# Patient Record
Sex: Female | Born: 1960 | Race: Black or African American | Hispanic: No | Marital: Single | Smoking: Former smoker
Health system: Southern US, Community
[De-identification: ages and names within clinical notes are randomized; demographics above are authoritative.]

## PROBLEM LIST (undated history)

## (undated) DIAGNOSIS — R920 Mammographic microcalcification found on diagnostic imaging of breast: Secondary | ICD-10-CM

## (undated) DIAGNOSIS — Z720 Tobacco use: Secondary | ICD-10-CM

## (undated) DIAGNOSIS — D696 Thrombocytopenia, unspecified: Secondary | ICD-10-CM

## (undated) DIAGNOSIS — E119 Type 2 diabetes mellitus without complications: Secondary | ICD-10-CM

## (undated) DIAGNOSIS — I10 Essential (primary) hypertension: Secondary | ICD-10-CM

## (undated) DIAGNOSIS — K8020 Calculus of gallbladder without cholecystitis without obstruction: Secondary | ICD-10-CM

## (undated) HISTORY — DX: Essential (primary) hypertension: I10

## (undated) HISTORY — DX: Calculus of gallbladder without cholecystitis without obstruction: K80.20

## (undated) HISTORY — DX: Tobacco use: Z72.0

## (undated) HISTORY — DX: Thrombocytopenia, unspecified: D69.6

## (undated) HISTORY — DX: Mammographic microcalcification found on diagnostic imaging of breast: R92.0

---

## 2004-09-18 ENCOUNTER — Ambulatory Visit: Payer: Self-pay | Admitting: Internal Medicine

## 2005-11-10 ENCOUNTER — Ambulatory Visit: Payer: Self-pay | Admitting: Internal Medicine

## 2005-11-24 ENCOUNTER — Ambulatory Visit: Payer: Self-pay | Admitting: Internal Medicine

## 2006-11-12 ENCOUNTER — Ambulatory Visit: Payer: Self-pay | Admitting: Internal Medicine

## 2007-12-07 ENCOUNTER — Ambulatory Visit: Payer: Self-pay | Admitting: Internal Medicine

## 2008-01-12 ENCOUNTER — Ambulatory Visit: Payer: Self-pay | Admitting: Internal Medicine

## 2008-09-25 ENCOUNTER — Emergency Department: Payer: Self-pay | Admitting: Unknown Physician Specialty

## 2009-01-15 ENCOUNTER — Ambulatory Visit: Payer: Self-pay | Admitting: Internal Medicine

## 2009-01-16 ENCOUNTER — Ambulatory Visit: Payer: Self-pay | Admitting: Internal Medicine

## 2009-05-09 ENCOUNTER — Ambulatory Visit: Payer: Self-pay | Admitting: Unknown Physician Specialty

## 2009-05-09 LAB — HM COLONOSCOPY

## 2010-01-14 HISTORY — PX: KNEE SURGERY: SHX244

## 2010-01-21 ENCOUNTER — Ambulatory Visit: Payer: Self-pay | Admitting: Orthopedic Surgery

## 2010-01-29 ENCOUNTER — Ambulatory Visit: Payer: Self-pay | Admitting: Orthopedic Surgery

## 2010-02-03 ENCOUNTER — Ambulatory Visit: Payer: Self-pay | Admitting: Orthopedic Surgery

## 2010-02-07 ENCOUNTER — Inpatient Hospital Stay: Payer: Self-pay | Admitting: Orthopedic Surgery

## 2010-04-14 ENCOUNTER — Ambulatory Visit: Payer: Self-pay | Admitting: Internal Medicine

## 2010-05-02 ENCOUNTER — Ambulatory Visit: Payer: Self-pay | Admitting: Internal Medicine

## 2010-11-11 ENCOUNTER — Ambulatory Visit: Payer: Self-pay | Admitting: General Surgery

## 2010-11-16 HISTORY — PX: BREAST BIOPSY: SHX20

## 2010-11-30 IMAGING — MG MAM DGTL ADD VW LT  SCR
1 series · 5 of 5 positions shown · non-contrast
Comparison: none

REASON FOR EXAM: DENSITY
COMMENTS:

[Series 2642: L ML · left · 5 of 5 slices shown]
[im 1/5]
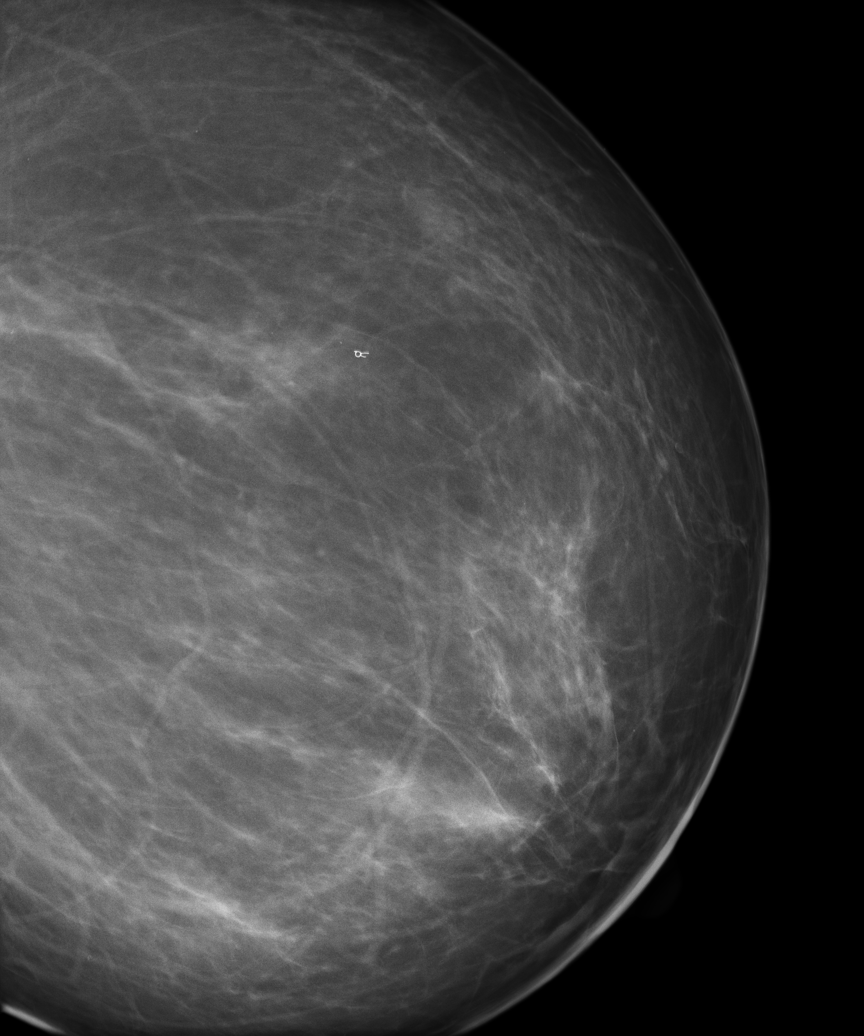
[im 2/5]
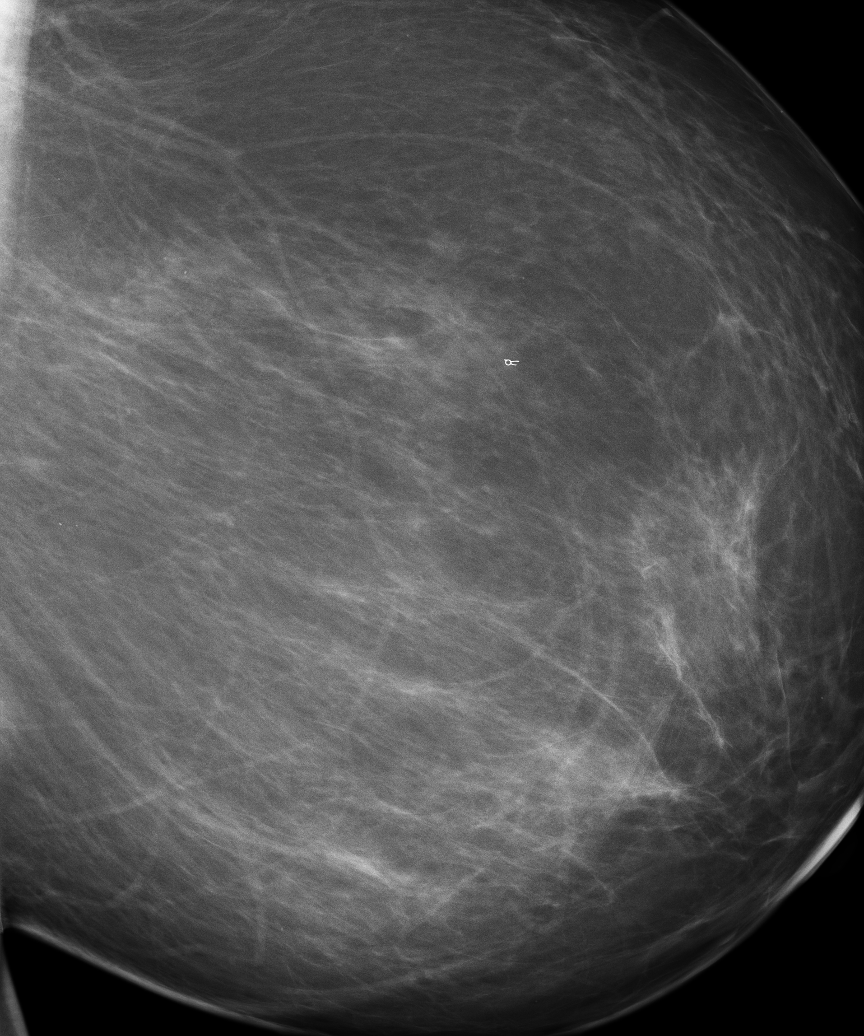
[im 3/5]
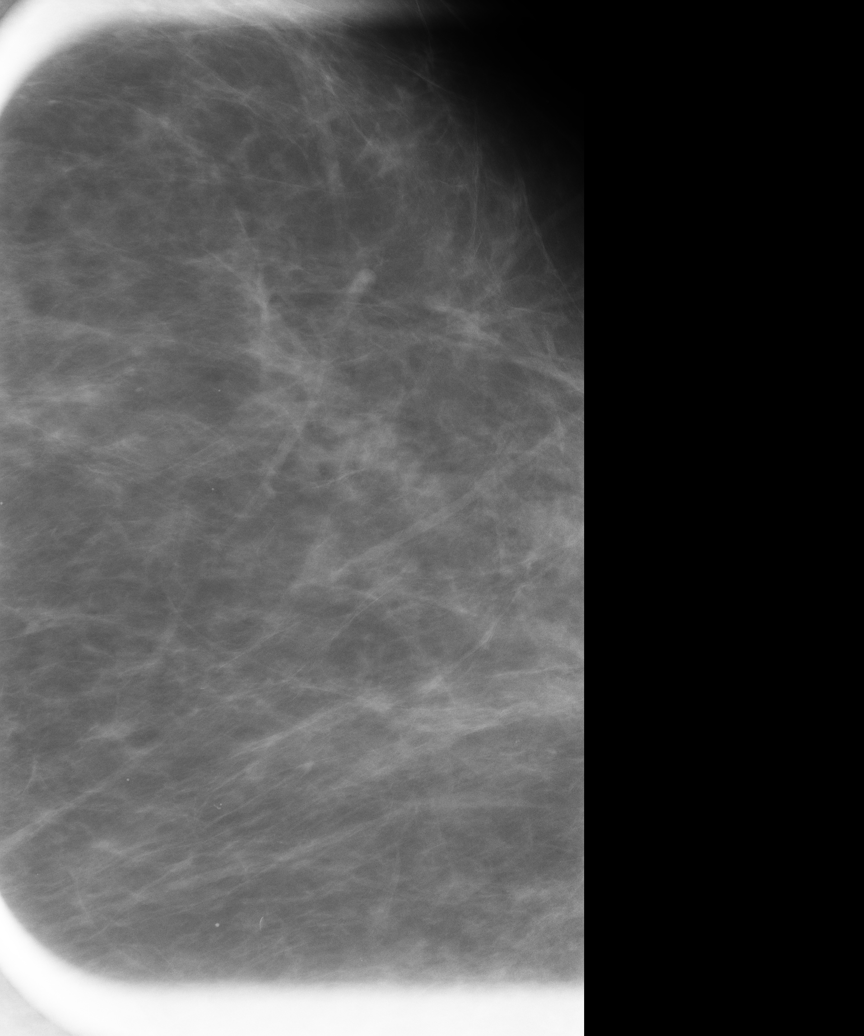
[im 4/5]
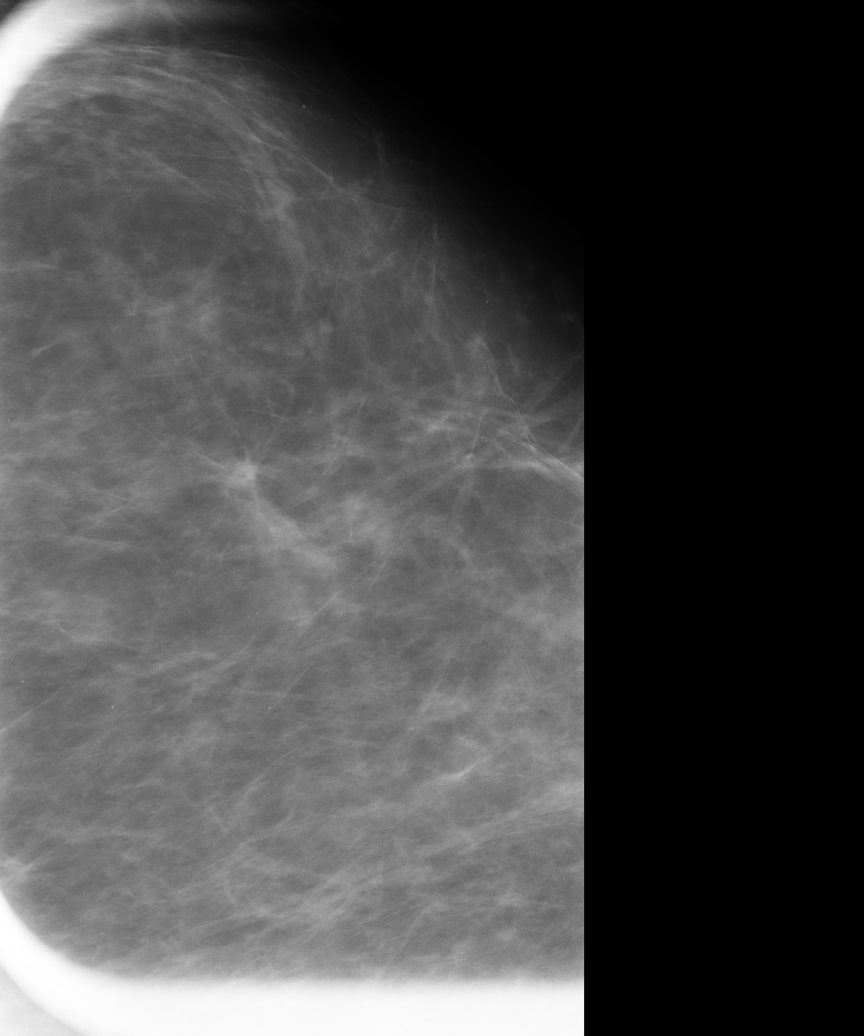
[im 5/5]
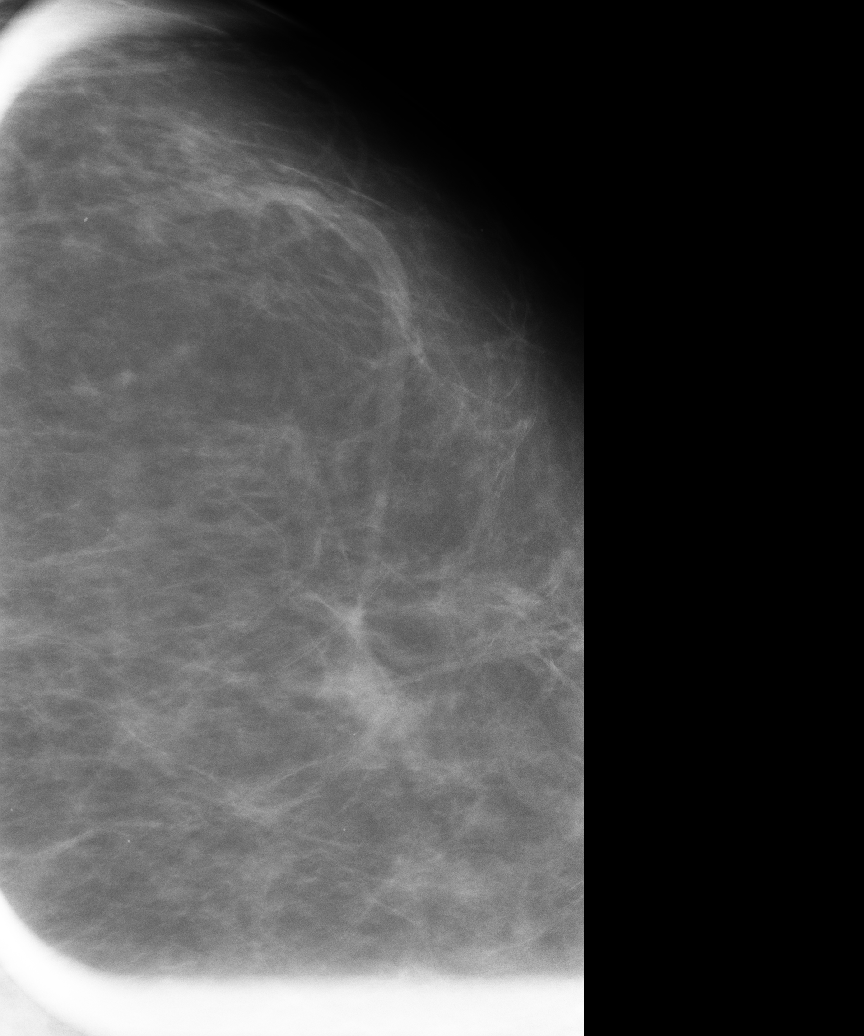

[5 of 5 positions shown; findings below may reference images not displayed]

PROCEDURE:     MAM - MAM DGTL ADD VW LT  SCR  - May 02, 2010  [DATE]

RESULT:     The patient returned for additional magnification compression
views to evaluate a nodular density seen in the upper outer portion of the
left breast. The area of concern persists on the additional views and is
best seen on the CC view as demonstrated previously. The lesion is high on
the mediolateral projections suggesting a roughly 1 o'clock location.
Sonographic evaluation of the upper outer portion of the left breast
demonstrates at least five, hypoechoic nodules all in close proximity
between 12 and 1 o'clock. The largest of these measures up to approximately
7.4 mm in diameter. Most of the lesions exhibit shadowing although some are
stronger than others. Most of the lesions are fairly superficial.
IMPRESSION: Nodular density mammographically does not have definite
malignant features. The numerous sonographic findings would appear to be in
a similar area as the density seen mammographically but it is uncertain
exactly as to what extent these correlate. Surgical consultation for further
correlation is recommended.

BI-RADS: Category 4 - Suspicious Abnormality

Thank you for this opportunity to contribute to the care of your patient.

A NEGATIVE MAMMOGRAM REPORT DOES NOT PRECLUDE BIOPSY OR OTHER EVALUATION OF
A CLINICALLY PALPABLE OR OTHERWISE SUSPICIOUS MASS OR LESION. BREAST CANCER
MAY NOT BE DETECTED BY MAMMOGRAPHY IN UP TO 10% OF CASES.

## 2012-02-05 ENCOUNTER — Ambulatory Visit: Payer: Self-pay | Admitting: Internal Medicine

## 2012-10-08 ENCOUNTER — Telehealth: Payer: Self-pay | Admitting: Internal Medicine

## 2012-10-08 DIAGNOSIS — I10 Essential (primary) hypertension: Secondary | ICD-10-CM

## 2012-10-08 MED ORDER — POTASSIUM CHLORIDE ER 10 MEQ PO TBCR: 10.0000 meq | EXTENDED_RELEASE_TABLET | Freq: Every day | ORAL | Status: DC

## 2012-10-08 MED ORDER — TRIAMTERENE-HCTZ 37.5-25 MG PO TABS: 1.0000 | ORAL_TABLET | Freq: Every day | ORAL | Status: DC

## 2012-10-08 NOTE — Telephone Encounter (Signed)
I sent her rx's for her triam/HCTZ and her potassium to [facility] (#[phone] with 3 refills).   Please notify pt.   I need her to come in sometime in the next 1-2 weeks for a lab check only.  i will order the labs.  Since on these meds - needs her potassium and renal function checked.  Lab appt only.

## 2012-10-10 NOTE — Telephone Encounter (Signed)
Left message on patient work phone to call office to schedule lab work

## 2012-10-11 ENCOUNTER — Telehealth: Payer: Self-pay | Admitting: Internal Medicine

## 2012-10-11 ENCOUNTER — Encounter: Payer: Self-pay | Admitting: *Deleted

## 2012-10-11 NOTE — Telephone Encounter (Signed)
Mailed script for blood work to patient

## 2012-10-11 NOTE — Telephone Encounter (Signed)
Refill request for klor-con M 10 Tablet Sig: 1 tablet by mouth once a day Patient does have an appt. On 01/17/13

## 2012-10-11 NOTE — Telephone Encounter (Signed)
Spoke with patient regarding pending lab work for potassium and renal function test and she requested a requisition be faxed to her employer. She works for [employer] and would like to have blood work done in their clinic.  Patient will call back 10/12/2012 with fax number.  Please advise

## 2012-10-11 NOTE — Telephone Encounter (Signed)
Left msg on patient home voice mail to have her call office regarding blood work

## 2012-10-11 NOTE — Telephone Encounter (Signed)
I wrote rx for labs.  Will not be able to fax rx (secondary to protected rx).  Can mail to pt if she desires or she can come pick up.  I will lay the rx for labs on your desk.

## 2012-10-11 NOTE — Telephone Encounter (Signed)
Script refilled for Klor-con M 10 tab

## 2013-01-12 ENCOUNTER — Encounter: Payer: Self-pay | Admitting: Internal Medicine

## 2013-01-17 ENCOUNTER — Ambulatory Visit (INDEPENDENT_AMBULATORY_CARE_PROVIDER_SITE_OTHER): Payer: No Typology Code available for payment source | Admitting: Internal Medicine

## 2013-01-17 ENCOUNTER — Encounter: Payer: Self-pay | Admitting: Internal Medicine

## 2013-01-17 VITALS — BP 140/88 | HR 73 | Temp 98.3°F | Ht 68.5 in | Wt 391.0 lb

## 2013-01-17 DIAGNOSIS — D696 Thrombocytopenia, unspecified: Secondary | ICD-10-CM

## 2013-01-17 DIAGNOSIS — Z86010 Personal history of colonic polyps: Secondary | ICD-10-CM

## 2013-01-17 DIAGNOSIS — I10 Essential (primary) hypertension: Secondary | ICD-10-CM

## 2013-01-17 DIAGNOSIS — F17200 Nicotine dependence, unspecified, uncomplicated: Secondary | ICD-10-CM

## 2013-01-18 ENCOUNTER — Encounter: Payer: Self-pay | Admitting: Internal Medicine

## 2013-01-18 DIAGNOSIS — Z720 Tobacco use: Secondary | ICD-10-CM | POA: Insufficient documentation

## 2013-01-18 NOTE — Assessment & Plan Note (Signed)
**Note De-Identified Yurem Viner Obfuscation** Had a colonoscopy 04/2009.  Two polyps removed.  Recommended a follow up colonoscopy 2010 (per GI).

## 2013-01-18 NOTE — Progress Notes (Signed)
  Subjective:    Patient ID: Morgan Duncan, female    DOB: 1961/01/26, 52 y.o.   MRN: [id number]  HPI 52 year old female with past history of hypertension and previously documented thrombocytopenia who comes in today for a scheduled follow up and to get established here at [facility].  States she is doing well.  Has started working with a trainer - 3x/week.  Is also exercising on her own - 2x/week.  Weight when she started was 404 pounds.  Now 391 pounds.  Just started a few weeks ago.  Feels better.  Still smoking.  Discussed the need to quit.  She does plan to work on this as well.  No ready.  Discussed treatment options.  States she had a period (3 days - light) last month.  Had been 10 months without a period.  Will monitor.  Bowels stable.    Past Medical History  Diagnosis Date  . Hypertension   . Thrombocytopenia     Recent platelet count 151 on 11/01/08  . Asymptomatic cholelithiasis   . Tobacco use     Current Outpatient Prescriptions on File Prior to Visit  Medication Sig Dispense Refill  . potassium chloride (KLOR-CON 10) 10 MEQ tablet Take 1 tablet (10 mEq total) by mouth daily.  30 tablet  3  . triamterene-hydrochlorothiazide (MAXZIDE-25) 37.5-25 MG per tablet Take 1 each (1 tablet total) by mouth daily.  30 tablet  3   No current facility-administered medications on file prior to visit.    Review of Systems Patient denies any headache, lightheadedness or dizziness. No sinus or allergy symptoms.  No chest pain, tightness or palpitations.  No increased shortness of breath, cough or congestion.  No nausea or vomiting. No acid reflux.  No abdominal pain or cramping.  No bowel change, such as diarrhea, constipation, BRBPR or melana.  No urine change.        Objective:   Physical Exam Filed Vitals:   01/17/13 1527  BP: 140/88  Pulse: 73  Temp: 98.3 F (36.8 C)   Blood pressure recheck:  124/76, pulse 40  52 year old female in no acute distress.   HEENT:  Nares- clear.   Oropharynx - without lesions. NECK:  Supple.  Nontender.  No audible bruit.  HEART:  Appears to be regular. LUNGS:  No crackles or wheezing audible.  Respirations even and unlabored.  RADIAL PULSE:  Equal bilaterally.  ABDOMEN:  Soft, nontender.  Bowel sounds present and normal.  No audible abdominal bruit.  EXTREMITIES:  No increased edema present.  DP pulses palpable and equal bilaterally.          Assessment & Plan:  MSK.  S/p kne surgery. Diagnosed with arthritis.  Stable.  Weight loss.   BREAST NODULE.  Saw Dr Lemar Livings.  Had biopsy. Due a follow up mammogram.  Gets this through work.  Rx given.    DESIRE FOR WEIGHT LOSS.  Working with a Psychologist, educational now.  Exercising.  Adjusting diet.  Has lost weight.  Continue.   HEALTH MAINTENANCE.  Schedule a physical next visit.  Last physical 02/04/12.  Colonoscopy 05/09/09.  Two polyps removed.  Recommended a follow up colonoscopy 04/2019.  Mammogram as outlined.  Check cholesterol.

## 2013-01-18 NOTE — Assessment & Plan Note (Signed)
Last platelet count wnl.  Recheck cbc.   

## 2013-01-18 NOTE — Assessment & Plan Note (Signed)
Blood pressure on my check looks good.  Same medication regimen.  Check metabolic panel.   

## 2013-01-18 NOTE — Assessment & Plan Note (Signed)
Discussed the need to quit smoking.  Discussed treatment options.  Not ready to quit at this time.  Does plan to quit.   

## 2013-02-14 ENCOUNTER — Other Ambulatory Visit: Payer: Self-pay | Admitting: *Deleted

## 2013-02-15 ENCOUNTER — Other Ambulatory Visit: Payer: Self-pay | Admitting: *Deleted

## 2013-02-15 MED ORDER — POTASSIUM CHLORIDE ER 10 MEQ PO TBCR: 10.0000 meq | EXTENDED_RELEASE_TABLET | Freq: Every day | ORAL | Status: DC

## 2013-02-15 MED ORDER — TRIAMTERENE-HCTZ 37.5-25 MG PO TABS: 1.0000 | ORAL_TABLET | Freq: Every day | ORAL | Status: DC

## 2013-02-15 NOTE — Telephone Encounter (Signed)
Rx sent in to pharmacy. 

## 2013-02-16 NOTE — Telephone Encounter (Signed)
Already filled

## 2013-02-20 ENCOUNTER — Encounter: Payer: Self-pay | Admitting: Internal Medicine

## 2013-04-10 ENCOUNTER — Encounter: Payer: Self-pay | Admitting: Internal Medicine

## 2013-04-27 ENCOUNTER — Encounter: Payer: No Typology Code available for payment source | Admitting: Internal Medicine

## 2013-07-04 ENCOUNTER — Ambulatory Visit: Payer: Self-pay | Admitting: General Practice

## 2013-07-11 ENCOUNTER — Ambulatory Visit: Payer: Self-pay | Admitting: Internal Medicine

## 2013-07-13 ENCOUNTER — Ambulatory Visit (INDEPENDENT_AMBULATORY_CARE_PROVIDER_SITE_OTHER): Payer: No Typology Code available for payment source | Admitting: Internal Medicine

## 2013-07-13 ENCOUNTER — Encounter: Payer: Self-pay | Admitting: Internal Medicine

## 2013-07-13 ENCOUNTER — Other Ambulatory Visit (HOSPITAL_COMMUNITY)
Admission: RE | Admit: 2013-07-13 | Discharge: 2013-07-13 | Disposition: A | Discharge status: Home / Self Care | Payer: No Typology Code available for payment source | Source: Ambulatory Visit | Attending: Internal Medicine | Admitting: Internal Medicine

## 2013-07-13 VITALS — BP 112/80 | HR 80 | Temp 98.3°F | Ht 68.5 in | Wt 392.0 lb

## 2013-07-13 DIAGNOSIS — Z720 Tobacco use: Secondary | ICD-10-CM

## 2013-07-13 DIAGNOSIS — Z1151 Encounter for screening for human papillomavirus (HPV): Secondary | ICD-10-CM | POA: Insufficient documentation

## 2013-07-13 DIAGNOSIS — F17200 Nicotine dependence, unspecified, uncomplicated: Secondary | ICD-10-CM

## 2013-07-13 DIAGNOSIS — Z124 Encounter for screening for malignant neoplasm of cervix: Secondary | ICD-10-CM

## 2013-07-13 DIAGNOSIS — M79605 Pain in left leg: Secondary | ICD-10-CM

## 2013-07-13 DIAGNOSIS — I10 Essential (primary) hypertension: Secondary | ICD-10-CM

## 2013-07-13 DIAGNOSIS — Z86010 Personal history of colonic polyps: Secondary | ICD-10-CM

## 2013-07-13 DIAGNOSIS — Z13220 Encounter for screening for lipoid disorders: Secondary | ICD-10-CM

## 2013-07-13 DIAGNOSIS — D696 Thrombocytopenia, unspecified: Secondary | ICD-10-CM

## 2013-07-13 DIAGNOSIS — Z01419 Encounter for gynecological examination (general) (routine) without abnormal findings: Secondary | ICD-10-CM | POA: Insufficient documentation

## 2013-07-13 DIAGNOSIS — M79609 Pain in unspecified limb: Secondary | ICD-10-CM

## 2013-07-13 LAB — CBC WITH DIFFERENTIAL/PLATELET
Basophils Absolute: 0 10*3/uL (ref 0.0–0.1)
Hemoglobin: 12.8 g/dL (ref 12.0–15.0)
Lymphocytes Relative: 34.1 % (ref 12.0–46.0)
Monocytes Relative: 6.5 % (ref 3.0–12.0)
Neutrophils Relative %: 56.9 % (ref 43.0–77.0)
Platelets: 137 10*3/uL — ABNORMAL LOW (ref 150.0–400.0)
RDW: 15.6 % — ABNORMAL HIGH (ref 11.5–14.6)

## 2013-07-14 LAB — LIPID PANEL
HDL: 64.6 mg/dL (ref >39.00)
Total CHOL/HDL Ratio: 3

## 2013-07-14 LAB — COMPREHENSIVE METABOLIC PANEL
ALT: 17 U/L (ref 0–35)
AST: 20 U/L (ref 0–37)
CO2: 27 mEq/L (ref 19–32)
Calcium: 9.2 mg/dL (ref 8.4–10.5)
Chloride: 105 mEq/L (ref 96–112)
GFR: 90.66 mL/min (ref >60.00)
Potassium: 3.9 mEq/L (ref 3.5–5.1)
Sodium: 137 mEq/L (ref 135–145)
Total Protein: 8 g/dL (ref 6.0–8.3)

## 2013-07-14 LAB — LDL CHOLESTEROL, DIRECT: Direct LDL: 140.7 mg/dL

## 2013-07-16 ENCOUNTER — Telehealth: Payer: Self-pay | Admitting: Internal Medicine

## 2013-07-16 DIAGNOSIS — D696 Thrombocytopenia, unspecified: Secondary | ICD-10-CM

## 2013-07-16 NOTE — Telephone Encounter (Signed)
Pt notified of lab results and need for f/u lab within the next 2-3 weeks.  Please schedule her for a f/u non fasting lab appt in 2-3 weeks and call pt with appt date and time.  Thanks.

## 2013-07-17 ENCOUNTER — Encounter: Payer: Self-pay | Admitting: Internal Medicine

## 2013-07-17 NOTE — Assessment & Plan Note (Signed)
Discussed the need to quit smoking.  Discussed treatment options.  Not ready to quit at this time.  Does plan to quit.   

## 2013-07-17 NOTE — Assessment & Plan Note (Signed)
Last platelet count wnl.  Recheck cbc.   

## 2013-07-17 NOTE — Progress Notes (Signed)
Subjective:    Patient ID: Morgan Duncan, female    DOB: 06-09-1961, 52 y.o.   MRN: [id number]  HPI 52 year old female with past history of hypertension and previously documented thrombocytopenia who comes in today to follow up on these issues as well as for a complete physical exam.  States she is doing well.  Still smoking.  Discussed the need to quit.  She does plan to work on this as well.  Not ready.  Bowels stable.  Increased stress recently.  Her mother recently passed away.  Feels she is handling this relatively well.  She did notice some itching 8/14.  Lower leg.  Progresses.  Increased firmness and tenderness.  Was evaluated at Urgent Care.  Was placed on doxycycline.  Finishing soon.  Had ultrasound that was negative for DVT.  Is better.  Still with the firm, tender area.  No increased redness.     Past Medical History  Diagnosis Date  . Hypertension   . Thrombocytopenia     Recent platelet count 151 on 11/01/08  . Asymptomatic cholelithiasis   . Tobacco use     Current Outpatient Prescriptions on File Prior to Visit  Medication Sig Dispense Refill  . potassium chloride (KLOR-CON 10) 10 MEQ tablet Take 1 tablet (10 mEq total) by mouth daily.  30 tablet  5  . triamterene-hydrochlorothiazide (MAXZIDE-25) 37.5-25 MG per tablet Take 1 each (1 tablet total) by mouth daily.  30 tablet  5   No current facility-administered medications on file prior to visit.    Review of Systems Patient denies any headache, lightheadedness or dizziness. No sinus or allergy symptoms.  No chest pain, tightness or palpitations.  No increased shortness of breath, cough or congestion.  No nausea or vomiting. No acid reflux.  No abdominal pain or cramping.  No bowel change, such as diarrhea, constipation, BRBPR or melana.  No urine change.  Leg discomfort and skin changes as outlined.        Objective:   Physical Exam  Filed Vitals:   07/13/13 1041  BP: 112/80  Pulse: 80  Temp: 98.3 F (36.8 C)    Blood pressure recheck:  48/32  52 year old female in no acute distress.   HEENT:  Nares- clear.  Oropharynx - without lesions. NECK:  Supple.  Nontender.  No audible bruit.  HEART:  Appears to be regular. LUNGS:  No crackles or wheezing audible.  Respirations even and unlabored.  RADIAL PULSE:  Equal bilaterally.  BREASTS:  No nipple discharge or nipple retraction present.  Could not appreciate any distinct nodules or axillary adenopathy.   ABDOMEN:  Soft, nontender.  Bowel sounds present and normal.  No audible abdominal bruit.  GU:  Normal external genitalia.  Vaginal vault without lesions.  Cervix identified.  Pap performed.  Could not appreciate any adnexal masses or tenderness.    EXTREMITIES:  No increased edema present.  DP pulses palpable and equal bilaterally.  Increased firmness and tenderness - just above the ankle.  No increase erythema.          Assessment & Plan:  MSK.  S/p kne surgery. Diagnosed with arthritis.  Stable.  Weight loss.   BREAST NODULE.  Saw Dr Lemar Livings.  Had biopsy. Due a follow up mammogram.  Gets this through work.  Rx given last visit.  Need results.    DESIRE FOR WEIGHT LOSS.  Exercise and diet adjustment.    POSSIBLE STASIS DERMATITIS.  Skin changes as outlined.  No increased redness.  Has improved on doxycycline.  Schedule an appt with vascular surgery for evaluation.  May need support hose.    HEALTH MAINTENANCE.  Physical today.  Colonoscopy 05/09/09.  Two polyps removed.  Recommended a follow up colonoscopy 04/2019.  Mammogram as outlined.  Need results.  Check cholesterol.

## 2013-07-17 NOTE — Assessment & Plan Note (Signed)
Had a colonoscopy 04/2009.  Two polyps removed.  Recommended a follow up colonoscopy 2020 (per GI).    

## 2013-07-17 NOTE — Assessment & Plan Note (Signed)
Blood pressure on my check looks good.  Same medication regimen.  Check metabolic panel.   

## 2013-07-18 NOTE — Telephone Encounter (Signed)
scheduled

## 2013-07-19 ENCOUNTER — Encounter: Payer: Self-pay | Admitting: *Deleted

## 2013-07-25 ENCOUNTER — Encounter: Payer: Self-pay | Admitting: Internal Medicine

## 2013-07-26 ENCOUNTER — Encounter: Payer: Self-pay | Admitting: Internal Medicine

## 2013-08-08 ENCOUNTER — Ambulatory Visit: Payer: Self-pay | Admitting: Internal Medicine

## 2013-08-08 ENCOUNTER — Other Ambulatory Visit: Payer: No Typology Code available for payment source

## 2013-08-08 ENCOUNTER — Telehealth: Payer: Self-pay | Admitting: Internal Medicine

## 2013-08-14 ENCOUNTER — Telehealth: Payer: Self-pay | Admitting: Internal Medicine

## 2013-08-14 DIAGNOSIS — R928 Other abnormal and inconclusive findings on diagnostic imaging of breast: Secondary | ICD-10-CM

## 2013-08-14 NOTE — Telephone Encounter (Signed)
Pt is scheduled 10/6 for stereotactic testing.  [facility] states pt weight is too high for their machine.  States weight limit is 300-350 and pt weighs 390+.  States the best thing for pt will be to schedule with a surgeon, and that we need to inform the pt.

## 2013-08-15 NOTE — Telephone Encounter (Signed)
Pt notified.  Needs appt with Dr Lemar Livings for abnormal mammogram.  Need asap.  Thanks.  Order placed for referral.   See the phone message from the hospital.

## 2013-08-17 ENCOUNTER — Other Ambulatory Visit: Payer: Self-pay

## 2013-08-17 ENCOUNTER — Encounter: Payer: Self-pay | Admitting: General Surgery

## 2013-08-17 ENCOUNTER — Ambulatory Visit (INDEPENDENT_AMBULATORY_CARE_PROVIDER_SITE_OTHER): Payer: No Typology Code available for payment source | Admitting: General Surgery

## 2013-08-17 VITALS — BP 124/86 | HR 80 | Resp 16 | Ht 69.0 in | Wt 386.0 lb

## 2013-08-17 DIAGNOSIS — R920 Mammographic microcalcification found on diagnostic imaging of breast: Secondary | ICD-10-CM

## 2013-08-17 NOTE — Patient Instructions (Signed)
Patient has been scheduled for a left breast stereotactic biopsy at [facility] for 08-24-13 at 11 am. This patient is aware of date, time, and instructions. Patient verbalizes understanding. This patient will stop by the [facility] today to sign a release so they can send breast films to [facility] [place] [address] Phone: [phone] Fax: [phone]).
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
## 2013-08-17 NOTE — Progress Notes (Signed)
Patient has been scheduled for a left breast stereotactic biopsy at [facility] for 08/28/13 at 3:00pm. She will check-in at the [facility] at 2:30 pm. This patient is aware of date, time, and instructions. Patient verbalizes understanding. This encounter was created in error - please disregard.

## 2013-08-24 ENCOUNTER — Other Ambulatory Visit: Payer: Self-pay | Admitting: Radiology

## 2013-08-27 ENCOUNTER — Other Ambulatory Visit (INDEPENDENT_AMBULATORY_CARE_PROVIDER_SITE_OTHER): Payer: Self-pay | Admitting: General Surgery

## 2013-08-27 NOTE — Progress Notes (Signed)
Pathology from left stereo biopsy completed at [facility] on Aug 24, 2013 reviewed. No malignancy or atypia. Will arrange for wound check w/ office RN this week, f/u mammogram of the left breast and OV in six months.

## 2013-08-28 ENCOUNTER — Telehealth: Payer: Self-pay

## 2013-08-28 NOTE — Telephone Encounter (Signed)
Notified patient as instructed, patient pleased. Discussed follow-up appointments, patient agrees. Patient to call office today to schedule follow up wound check with the nurse on Thursday or Friday. Placed in recalls for 6 month follow up with [facility].

## 2013-08-28 NOTE — Telephone Encounter (Signed)
Message copied by Sinda Du on Mon Aug 28, 2013  8:25 AM ------      Message from: [place], [place]      Created: Sun Aug 27, 2013  8:05 AM       Please notify the patient her biopsy completed Oct 9 at [facility] did NOT show any cancer. She should plan on having a left breast (screening) mammogram in six months w/ OV to follow. Wound check w/ Mindi Junker this week.  ------

## 2013-09-03 ENCOUNTER — Other Ambulatory Visit: Payer: Self-pay | Admitting: Internal Medicine

## 2013-09-04 ENCOUNTER — Other Ambulatory Visit: Payer: Self-pay | Admitting: Internal Medicine

## 2013-09-13 ENCOUNTER — Encounter: Payer: Self-pay | Admitting: Internal Medicine

## 2013-11-16 DIAGNOSIS — R920 Mammographic microcalcification found on diagnostic imaging of breast: Secondary | ICD-10-CM

## 2013-11-16 HISTORY — DX: Mammographic microcalcification found on diagnostic imaging of breast: R92.0

## 2014-01-16 ENCOUNTER — Telehealth: Payer: Self-pay | Admitting: Internal Medicine

## 2014-01-16 ENCOUNTER — Encounter: Payer: Self-pay | Admitting: Internal Medicine

## 2014-01-16 ENCOUNTER — Encounter (INDEPENDENT_AMBULATORY_CARE_PROVIDER_SITE_OTHER): Payer: Self-pay

## 2014-01-16 ENCOUNTER — Ambulatory Visit (INDEPENDENT_AMBULATORY_CARE_PROVIDER_SITE_OTHER): Payer: No Typology Code available for payment source | Admitting: Internal Medicine

## 2014-01-16 ENCOUNTER — Other Ambulatory Visit: Payer: Self-pay | Admitting: Internal Medicine

## 2014-01-16 VITALS — BP 118/78 | HR 74 | Temp 98.1°F | Resp 14 | Ht 68.5 in | Wt 384.0 lb

## 2014-01-16 DIAGNOSIS — R002 Palpitations: Secondary | ICD-10-CM

## 2014-01-16 DIAGNOSIS — D696 Thrombocytopenia, unspecified: Secondary | ICD-10-CM

## 2014-01-16 DIAGNOSIS — Z86010 Personal history of colonic polyps: Secondary | ICD-10-CM

## 2014-01-16 DIAGNOSIS — I10 Essential (primary) hypertension: Secondary | ICD-10-CM

## 2014-01-16 DIAGNOSIS — Z720 Tobacco use: Secondary | ICD-10-CM

## 2014-01-16 DIAGNOSIS — E7800 Pure hypercholesterolemia, unspecified: Secondary | ICD-10-CM

## 2014-01-16 DIAGNOSIS — Z1211 Encounter for screening for malignant neoplasm of colon: Secondary | ICD-10-CM

## 2014-01-16 DIAGNOSIS — F17200 Nicotine dependence, unspecified, uncomplicated: Secondary | ICD-10-CM

## 2014-01-16 LAB — CBC WITH DIFFERENTIAL/PLATELET
Basophils Absolute: 0 10*3/uL (ref 0.0–0.1)
Basophils Relative: 0.4 % (ref 0.0–3.0)
EOS PCT: 4.3 % (ref 0.0–5.0)
Eosinophils Absolute: 0.3 10*3/uL (ref 0.0–0.7)
HCT: 39.3 % (ref 36.0–46.0)
HEMOGLOBIN: 13 g/dL (ref 12.0–15.0)
LYMPHS ABS: 2.3 10*3/uL (ref 0.7–4.0)
Lymphocytes Relative: 34.6 % (ref 12.0–46.0)
MCHC: 33 g/dL (ref 30.0–36.0)
MCV: 85.8 fl (ref 78.0–100.0)
Monocytes Absolute: 0.4 10*3/uL (ref 0.1–1.0)
Monocytes Relative: 6.1 % (ref 3.0–12.0)
NEUTROS ABS: 3.6 10*3/uL (ref 1.4–7.7)
Neutrophils Relative %: 54.6 % (ref 43.0–77.0)
Platelets: 129 10*3/uL — ABNORMAL LOW (ref 150.0–400.0)
RBC: 4.58 Mil/uL (ref 3.87–5.11)
RDW: 14.2 % (ref 11.5–14.6)
WBC: 6.6 10*3/uL (ref 4.5–10.5)

## 2014-01-16 LAB — COMPREHENSIVE METABOLIC PANEL
ALBUMIN: 3.9 g/dL (ref 3.5–5.2)
ALK PHOS: 71 U/L (ref 39–117)
ALT: 21 U/L (ref 0–35)
AST: 16 U/L (ref 0–37)
BUN: 14 mg/dL (ref 6–23)
CO2: 26 mEq/L (ref 19–32)
Calcium: 9 mg/dL (ref 8.4–10.5)
Chloride: 103 mEq/L (ref 96–112)
Creatinine, Ser: 0.9 mg/dL (ref 0.4–1.2)
GFR: 88.08 mL/min (ref >60.00)
Glucose, Bld: 92 mg/dL (ref 70–99)
POTASSIUM: 3.8 meq/L (ref 3.5–5.1)
SODIUM: 137 meq/L (ref 135–145)
TOTAL PROTEIN: 7.4 g/dL (ref 6.0–8.3)
Total Bilirubin: 0.3 mg/dL (ref 0.3–1.2)

## 2014-01-16 LAB — LIPID PANEL
Cholesterol: 197 mg/dL (ref 0–200)
HDL: 56.7 mg/dL (ref >39.00)
LDL CALC: 124 mg/dL — AB (ref 0–99)
Total CHOL/HDL Ratio: 3
Triglycerides: 84 mg/dL (ref 0.0–149.0)
VLDL: 16.8 mg/dL (ref 0.0–40.0)

## 2014-01-16 LAB — TSH: TSH: 0.59 u[IU]/mL (ref 0.35–5.50)

## 2014-01-16 NOTE — Progress Notes (Signed)
Orders placed for f/u labs.  

## 2014-01-16 NOTE — Telephone Encounter (Signed)
Pt notified of lab results via my chart.  Needs a f/u non fasting lab within the next 2-3 weeks.  Please schedule and contact her with a lab appt date and time.  Thanks.

## 2014-01-16 NOTE — Progress Notes (Signed)
Pre visit review using our clinic review tool, if applicable. No additional management support is needed unless otherwise documented below in the visit note. 

## 2014-01-17 NOTE — Telephone Encounter (Signed)
Sent my chart message letting pt know about appointmetn

## 2014-01-18 NOTE — Telephone Encounter (Signed)
Mailed unread message to pt  

## 2014-01-20 ENCOUNTER — Encounter: Payer: Self-pay | Admitting: Internal Medicine

## 2014-01-20 DIAGNOSIS — R002 Palpitations: Secondary | ICD-10-CM | POA: Insufficient documentation

## 2014-01-20 NOTE — Assessment & Plan Note (Signed)
Had a colonoscopy 04/2009.  Two polyps removed.  Recommended a follow up colonoscopy 2020 (per GI).    

## 2014-01-20 NOTE — Assessment & Plan Note (Addendum)
Intermittent palpitations as outlined.  Discussed decreasing caffeine.  Discussed stress and dealing with increased stress.  EKG revealed SR with no acute ischemic changes.  Discussed with her regarding further cardiac w/up.  She declines.  Wants to follow.  Will notify me if persistent or worsening symptoms.  Check tsh.

## 2014-01-20 NOTE — Progress Notes (Signed)
  Subjective:    Patient ID: Morgan Duncan, female    DOB: 10/26/1961, 53 y.o.   MRN: [id number]  HPI 53 year old female with past history of hypertension and previously documented thrombocytopenia who comes in today for a scheduled follow up.   States she is doing well.  Still smoking.  Discussed the need to quit.  She does plan to work on this.   Bowels stable.  Increased stress recently.  Her mother recently passed away.  Feels she is handling this relatively well.  She has noticed some hear fluttering and palpitations - occasionally.  She notices when sitting.  Only last a few minutes.  May occur 2-3x/week.  Does not occur when exercising.  Has been exercising 3x/2week.  May exercise 30-40 minutes.  Has been drinking more coffee.     Past Medical History  Diagnosis Date  . Hypertension   . Thrombocytopenia     Recent platelet count 151 on 11/01/08  . Asymptomatic cholelithiasis   . Tobacco use     Current Outpatient Prescriptions on File Prior to Visit  Medication Sig Dispense Refill  . potassium chloride (K-DUR) 10 MEQ tablet TAKE 1 TABLET EVERY DAY  30 tablet  5  . triamterene-hydrochlorothiazide (MAXZIDE-25) 37.5-25 MG per tablet TAKE 1 TABLET EVERY DAY  30 tablet  5   No current facility-administered medications on file prior to visit.    Review of Systems Patient denies any headache, lightheadedness or dizziness. No sinus or allergy symptoms.  No chest pain, tightness.  Does report the palpitations as outlined.  No increased shortness of breath, cough or congestion.  No nausea or vomiting. No acid reflux.  No abdominal pain or cramping.  No bowel change, such as diarrhea, constipation, BRBPR or melana.  No urine change.  Is exercising.  Had increased questions about weight loss.       Objective:   Physical Exam  Filed Vitals:   01/16/14 1010  BP: 118/78  Pulse: 74  Temp: 98.1 F (36.7 C)  Resp: 5914   53 year old female in no acute distress.   HEENT:  Nares- clear.   Oropharynx - without lesions. NECK:  Supple.  Nontender.  No audible bruit.  HEART:  Appears to be regular. LUNGS:  No crackles or wheezing audible.  Respirations even and unlabored.  RADIAL PULSE:  Equal bilaterally.    ABDOMEN:  Soft, nontender.  Bowel sounds present and normal.  No audible abdominal bruit.    EXTREMITIES:  No increased edema present.  DP pulses palpable and equal bilaterally.          Assessment & Plan:  MSK.  S/p knee surgery. Diagnosed with arthritis.  Stable.  Weight loss.   BREAST NODULE.  Saw Dr Lemar LivingsByrnett.  Had biopsy.  Due f/u left breast mammogram 4/15.  Scheduled.    DESIRE FOR WEIGHT LOSS.  Exercise and diet adjustment.  Discussed at length with her today.    HEALTH MAINTENANCE.  Physical 07/13/13.  Colonoscopy 05/09/09.  Two polyps removed.  Recommended a follow up colonoscopy 04/2019.  Mammogram as outlined.

## 2014-01-20 NOTE — Assessment & Plan Note (Signed)
Discussed the need to quit smoking.  Discussed treatment options.  Not ready to quit at this time.  Does plan to quit.

## 2014-01-20 NOTE — Assessment & Plan Note (Signed)
Last platelet count wnl.  Recheck cbc.   

## 2014-01-20 NOTE — Assessment & Plan Note (Signed)
Blood pressure on my check looks good.  Same medication regimen.  Check metabolic panel.   

## 2014-01-25 ENCOUNTER — Ambulatory Visit: Payer: Self-pay | Admitting: General Surgery

## 2014-01-26 ENCOUNTER — Encounter: Payer: Self-pay | Admitting: General Surgery

## 2014-02-01 ENCOUNTER — Other Ambulatory Visit: Payer: No Typology Code available for payment source

## 2014-02-07 ENCOUNTER — Ambulatory Visit (INDEPENDENT_AMBULATORY_CARE_PROVIDER_SITE_OTHER): Payer: No Typology Code available for payment source | Admitting: General Surgery

## 2014-02-07 ENCOUNTER — Encounter: Payer: Self-pay | Admitting: General Surgery

## 2014-02-07 VITALS — BP 152/82 | HR 78 | Resp 16 | Ht 69.5 in | Wt 385.0 lb

## 2014-02-07 DIAGNOSIS — R920 Mammographic microcalcification found on diagnostic imaging of breast: Secondary | ICD-10-CM

## 2014-02-07 NOTE — Progress Notes (Signed)
Patient ID: Morgan Duncan, female   DOB: [birth year], 53 y.o.   MRN: [id number]  Chief Complaint  Patient presents with  . Follow-up    left screening mammogram     HPI Morgan Duncan is a 53 y.o. female who presents for a breast evaluation. The most recent mammogram was done on 01/25/14. Patient does per/form regular self breast checks and gets regular mammograms done.  The patient denies any problems with the breasts at this time.    HPI  Past Medical History  Diagnosis Date  . Hypertension   . Thrombocytopenia     Recent platelet count 151 on 11/01/08  . Asymptomatic cholelithiasis   . Tobacco use   . Mammographic microcalcification 2015    Past Surgical History  Procedure Laterality Date  . Knee surgery Left 3/11    left knee   . Breast biopsy Left 2012    Family History  Problem Relation Age of Onset  . Hypertension Mother   . Diabetes Mother   . Hypothyroidism Mother   . Heart disease Father     Died of a Heart Attack     Social History History  Substance Use Topics  . Smoking status: Current Every Day Smoker -- 0.25 packs/day for 12 years    Types: Cigarettes  . Smokeless tobacco: Never Used  . Alcohol Use: Yes     Comment: occasionally    No Known Allergies  Current Outpatient Prescriptions  Medication Sig Dispense Refill  . cyanocobalamin 500 MCG tablet Take 500 mcg by mouth daily.      . Omega-3 Fatty Acids (FISH OIL) 1000 MG CAPS Take 2 capsules by mouth daily.      . Pediatric Multiple Vitamins (CHEWABLE MULTIPLE VITAMINS PO) Take 1 tablet by mouth daily.      . potassium chloride (K-DUR) 10 MEQ tablet TAKE 1 TABLET EVERY DAY  30 tablet  5  . triamterene-hydrochlorothiazide (MAXZIDE-25) 37.5-25 MG per tablet TAKE 1 TABLET EVERY DAY  30 tablet  5   No current facility-administered medications for this visit.    Review of Systems Review of Systems  Constitutional: Negative.   Respiratory: Negative.   Cardiovascular: Negative.     Blood  pressure 152/82, pulse 78, resp. rate 16, height 5' 9.5" (1.765 m), weight 385 lb (174.635 kg).  Physical Exam Physical Exam  Constitutional: She is oriented to person, place, and time. She appears well-developed and well-nourished.  Neck: Neck supple. No thyromegaly present.  Cardiovascular: Normal rate, regular rhythm and normal heart sounds.   No murmur heard. Pulmonary/Chest: Effort normal and breath sounds normal. Right breast exhibits no inverted nipple, no mass, no nipple discharge, no skin change and no tenderness. Left breast exhibits no inverted nipple, no mass, no nipple discharge, no skin change and no tenderness.  Lymphadenopathy:    She has no cervical adenopathy.    She has no axillary adenopathy.  Neurological: She is alert and oriented to person, place, and time.  Skin: Skin is warm and dry.    Data Reviewed Pathology from the August 24, 2013 stereotactic left breast biopsy completed at [facility] in [place] showed benign breast tissue with microcalcifications.  Postbiopsy mammogram dated January 25, 2014 of the left breast showed no residual microcalcifications in the upper-outer quadrant.  Assessment    Benign breast exam.     Plan    The patient should resume annual screening mammograms and fall 2015 with her primary care provider.     PCP: Dr.  Charlene Scott  Ref. MD: Dr. Dale Durhamharlene Scott  Earline MayotteByrnett, Tejasvi Brissett W 02/07/2014, 8:02 PM

## 2014-02-07 NOTE — Patient Instructions (Signed)
Patient to return as needed. Continue self breast exams. Call office for any new breast issues or concerns.'  

## 2014-02-13 ENCOUNTER — Encounter: Payer: Self-pay | Admitting: Internal Medicine

## 2014-02-13 ENCOUNTER — Encounter: Payer: Self-pay | Admitting: General Surgery

## 2014-02-13 DIAGNOSIS — R920 Mammographic microcalcification found on diagnostic imaging of breast: Secondary | ICD-10-CM

## 2014-03-08 IMAGING — MG MM ADDITIONAL VIEWS AT NO CHARGE
1 series · 3 of 3 positions shown · non-contrast
Comparison: 07/11/2013

REASON FOR EXAM: av lt calcifications
COMMENTS:

PROCEDURE:     MAM - MAM DGTL ADD VW LT  SCR  - August 08, 2013  [DATE]
CLINICAL DATA: Calcifications left breast identified on recent
screening mammogram.
DIGITAL DIAGNOSTIC LEFT MAMMOGRAM WITHOUT CAD

[L CC · left · 3 of 3 slices shown]
[im 1/3]
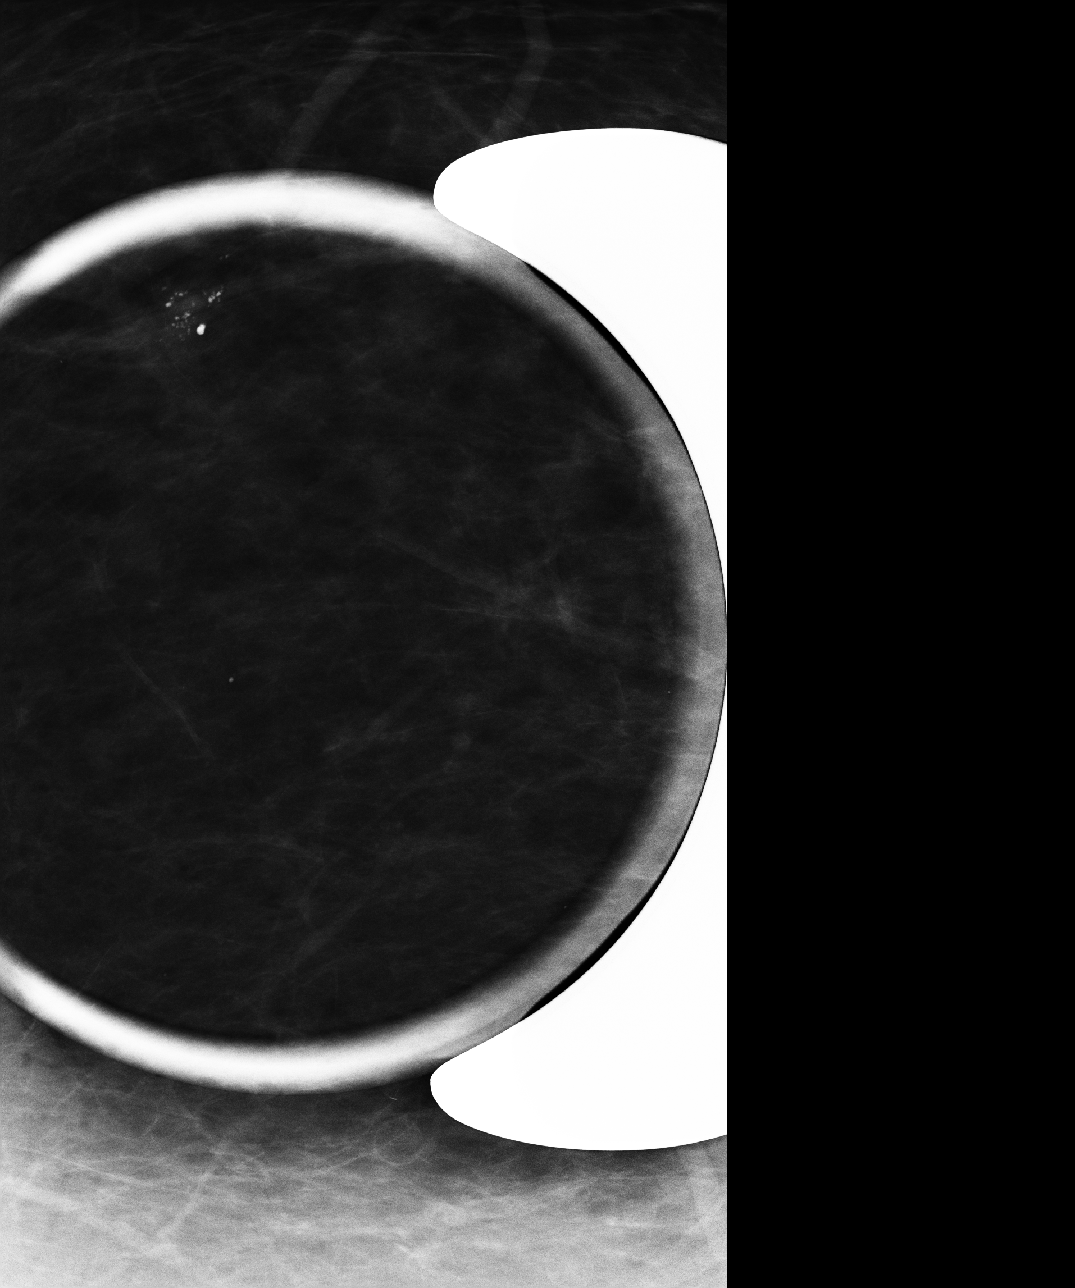
[im 2/3]
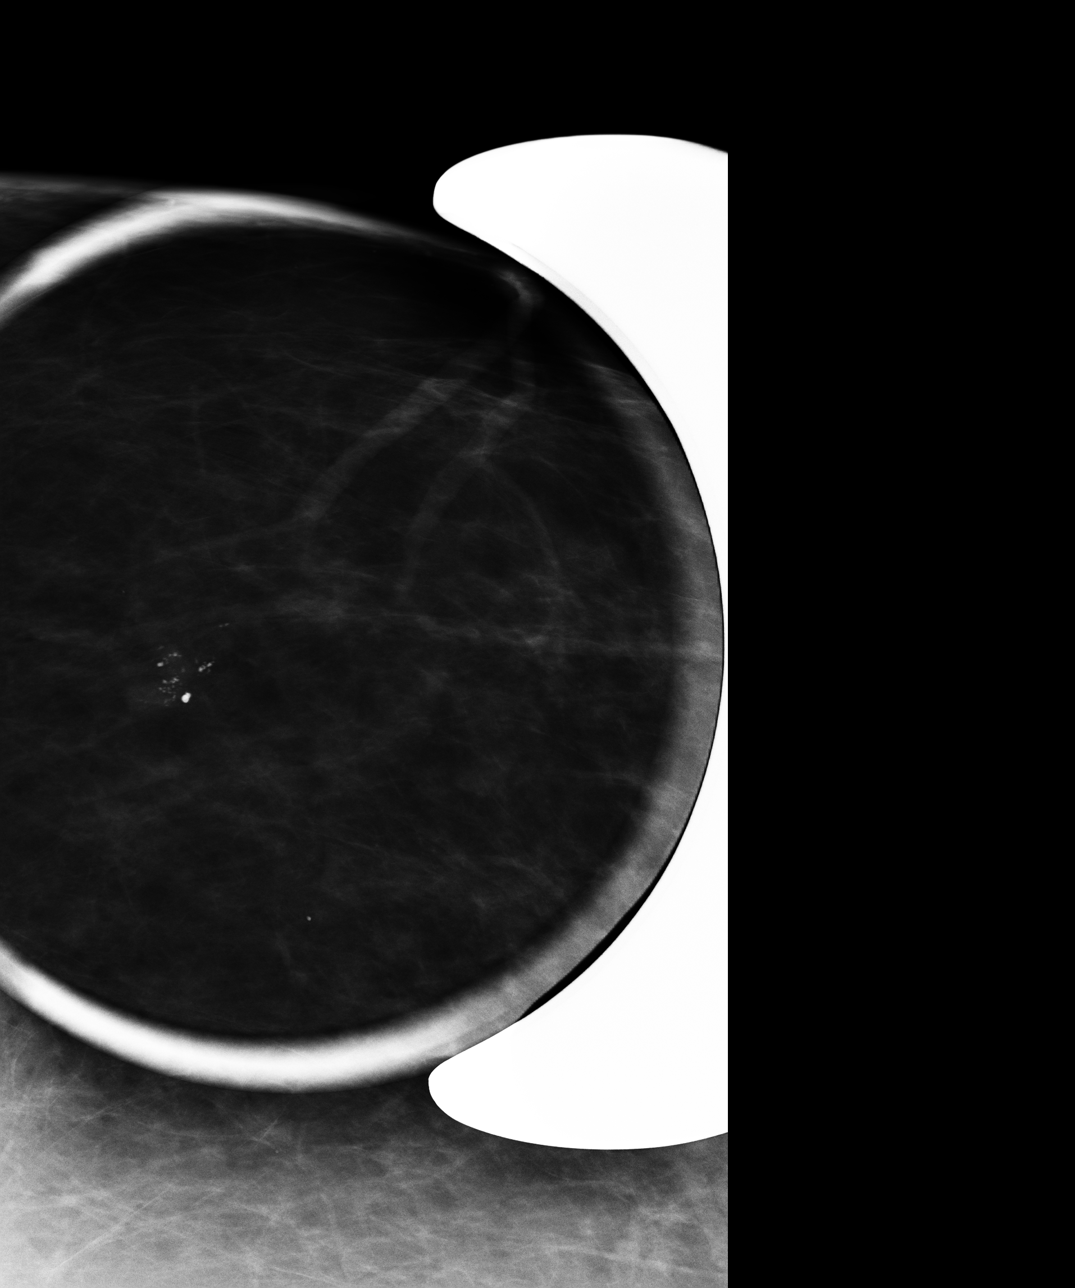
[im 3/3]
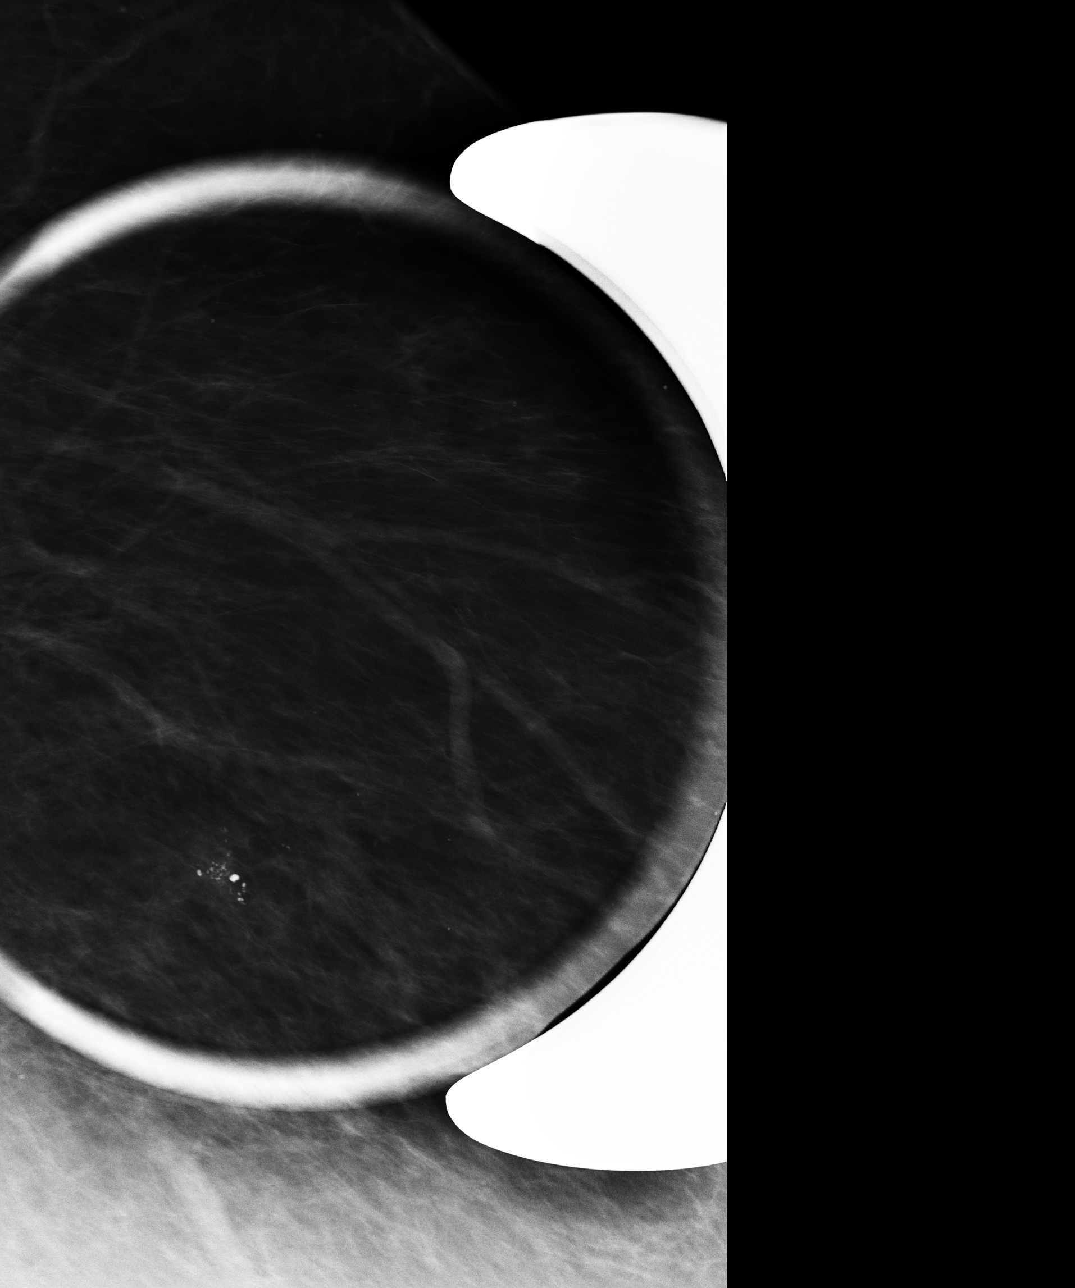

[3 of 3 positions shown; findings below may reference images not displayed]

FINDINGS: ACR Breast Density Category b:  There are scattered areas of
fibroglandular density.

Magnification views of the upper outer left breast, posterior
third, demonstrate a cluster of heterogeneous calcifications
spanning 7 x 7 x 5 mm.  A few of these are very coarse.
IMPRESSION: No cluster heterogeneous calcifications in the upper outer left
breast.  While these could reflect benign fibroadenomatoid changes,
ductal carcinoma in situ (DCIS) cannot be excluded.

RECOMMENDATION:
Stereotactic biopsy of the left breast is recommended.  Findings
and recommendations were discussed with the patient today.
Stereotactic biopsy is  scheduled for August 21, 2013 at 9 o'clock
a.m.

I have discussed the findings and recommendations with the patient.
Results were also provided in writing at the conclusion of the
visit.  If applicable, a reminder letter will be sent to the
patient regarding her next appointment.

BI-RADS CATEGORY 4:  Suspicious abnormality - biopsy should be
considered.

## 2014-04-15 ENCOUNTER — Other Ambulatory Visit: Payer: Self-pay | Admitting: Internal Medicine

## 2014-05-01 ENCOUNTER — Ambulatory Visit: Payer: No Typology Code available for payment source | Admitting: Internal Medicine

## 2014-05-17 ENCOUNTER — Encounter: Payer: Self-pay | Admitting: Internal Medicine

## 2014-05-17 ENCOUNTER — Ambulatory Visit (INDEPENDENT_AMBULATORY_CARE_PROVIDER_SITE_OTHER): Payer: No Typology Code available for payment source | Admitting: Internal Medicine

## 2014-05-17 VITALS — BP 120/70 | HR 82 | Temp 98.0°F | Ht 69.5 in | Wt 382.2 lb

## 2014-05-17 DIAGNOSIS — M25562 Pain in left knee: Secondary | ICD-10-CM

## 2014-05-17 DIAGNOSIS — R002 Palpitations: Secondary | ICD-10-CM

## 2014-05-17 DIAGNOSIS — D696 Thrombocytopenia, unspecified: Secondary | ICD-10-CM

## 2014-05-17 DIAGNOSIS — I10 Essential (primary) hypertension: Secondary | ICD-10-CM

## 2014-05-17 DIAGNOSIS — Z1239 Encounter for other screening for malignant neoplasm of breast: Secondary | ICD-10-CM

## 2014-05-17 DIAGNOSIS — F17200 Nicotine dependence, unspecified, uncomplicated: Secondary | ICD-10-CM

## 2014-05-17 DIAGNOSIS — Z86010 Personal history of colonic polyps: Secondary | ICD-10-CM

## 2014-05-17 DIAGNOSIS — M25569 Pain in unspecified knee: Secondary | ICD-10-CM

## 2014-05-17 DIAGNOSIS — R920 Mammographic microcalcification found on diagnostic imaging of breast: Secondary | ICD-10-CM

## 2014-05-17 DIAGNOSIS — Z720 Tobacco use: Secondary | ICD-10-CM

## 2014-05-17 LAB — CBC WITH DIFFERENTIAL/PLATELET
BASOS PCT: 0.6 % (ref 0.0–3.0)
Basophils Absolute: 0 10*3/uL (ref 0.0–0.1)
EOS ABS: 0.1 10*3/uL (ref 0.0–0.7)
EOS PCT: 2.6 % (ref 0.0–5.0)
HCT: 37.9 % (ref 36.0–46.0)
HEMOGLOBIN: 13 g/dL (ref 12.0–15.0)
LYMPHS PCT: 37.2 % (ref 12.0–46.0)
Lymphs Abs: 2.1 10*3/uL (ref 0.7–4.0)
MCHC: 34.2 g/dL (ref 30.0–36.0)
MCV: 84.2 fl (ref 78.0–100.0)
Monocytes Absolute: 0.3 10*3/uL (ref 0.1–1.0)
Monocytes Relative: 5.6 % (ref 3.0–12.0)
Neutro Abs: 3 10*3/uL (ref 1.4–7.7)
Neutrophils Relative %: 54 % (ref 43.0–77.0)
Platelets: 130 10*3/uL — ABNORMAL LOW (ref 150.0–400.0)
RBC: 4.5 Mil/uL (ref 3.87–5.11)
RDW: 14.5 % (ref 11.5–15.5)
WBC: 5.5 10*3/uL (ref 4.0–10.5)

## 2014-05-17 LAB — BASIC METABOLIC PANEL
BUN: 19 mg/dL (ref 6–23)
CHLORIDE: 104 meq/L (ref 96–112)
CO2: 31 meq/L (ref 19–32)
Calcium: 9.2 mg/dL (ref 8.4–10.5)
Creatinine, Ser: 1 mg/dL (ref 0.4–1.2)
GFR: 75.78 mL/min (ref >60.00)
Glucose, Bld: 101 mg/dL — ABNORMAL HIGH (ref 70–99)
Potassium: 4.1 mEq/L (ref 3.5–5.1)
Sodium: 140 mEq/L (ref 135–145)

## 2014-05-17 NOTE — Assessment & Plan Note (Signed)
Blood pressure on my check looks good.  Same medication regimen.  Check metabolic panel.

## 2014-05-17 NOTE — Progress Notes (Signed)
Subjective:    Patient ID: Morgan CornerKathy D Buresh, female    DOB: 06/05/1961, 53 y.o.   MRN: [id number]  HPI 53 year old female with past history of hypertension and previously documented thrombocytopenia who comes in today for a scheduled follow up.   States she is doing well.  Still smoking.  Discussed the need to quit.  She does plan to work on this.   Bowels stable.  Increased stress.  Her mother recently passed away.  Feels she is handling this relatively well.  Doing better.   She had reported noticing some heart fluttering and palpitations last visit.  See last note for details.  Better.   Was exercising.  Has noticed increased left knee pain recently.  Has been worse for more than a month.  Notices when she walks on the treadmill.  Request referral to ortho.     Past Medical History  Diagnosis Date  . Hypertension   . Thrombocytopenia     Recent platelet count 151 on 11/01/08  . Asymptomatic cholelithiasis   . Tobacco use   . Mammographic microcalcification 2015    Current Outpatient Prescriptions on File Prior to Visit  Medication Sig Dispense Refill  . cyanocobalamin 500 MCG tablet Take 500 mcg by mouth daily.      [name]. KLOR-CON M10 10 MEQ tablet TAKE 1 TABLET EVERY DAY  30 tablet  5  . Omega-3 Fatty Acids (FISH OIL) 1000 MG CAPS Take 2 capsules by mouth daily.      . Pediatric Multiple Vitamins (CHEWABLE MULTIPLE VITAMINS PO) Take 1 tablet by mouth daily.      [name]. triamterene-hydrochlorothiazide (MAXZIDE-25) 37.5-25 MG per tablet TAKE 1 TABLET EVERY DAY  30 tablet  5  . [DISCONTINUED] potassium chloride (K-DUR) 10 MEQ tablet TAKE 1 TABLET EVERY DAY  30 tablet  5   No current facility-administered medications on file prior to visit.    Review of Systems Patient denies any headache, lightheadedness or dizziness. No sinus or allergy symptoms.  No chest pain, tightness. Palpitations improved.  No increased shortness of breath, cough or congestion.  No nausea or vomiting.  No acid reflux.   No abdominal pain or cramping.  No bowel change, such as diarrhea, constipation, BRBPR or melana.  No urine change.  Left knee pain as outlined.  Limiting exercise.  Had previous surgery.      Objective:   Physical Exam  Filed Vitals:   05/17/14 0956  BP: 120/70  Pulse: 82  Temp: 98 F (36.7 C)   Blood pressure recheck:  80112/4676  53 year old female in no acute distress.   HEENT:  Nares- clear.  Oropharynx - without lesions. NECK:  Supple.  Nontender.  No audible bruit.  HEART:  Appears to be regular. LUNGS:  No crackles or wheezing audible.  Respirations even and unlabored.  RADIAL PULSE:  Equal bilaterally.    ABDOMEN:  Soft, nontender.  Bowel sounds present and normal.  No audible abdominal bruit.    EXTREMITIES:  No increased edema present.  DP pulses palpable and equal bilaterally.  MSK:  Increased pain left lateral knee.  Good rom.  No instability of the knee.          Assessment & Plan:  MSK.  S/p knee surgery. Diagnosed with arthritis.  Stable.  Weight loss.   BREAST NODULE.  Saw Dr Lemar LivingsByrnett.  Had biopsy.  Had f/u left breast mammo in 01/25/14.  Due f/u bilateral mammogram in 8/15.  DESIRE FOR WEIGHT LOSS.  Exercise and diet adjustment.     HEALTH MAINTENANCE.  Physical 07/13/13.  Colonoscopy 05/09/09.  Two polyps removed.  Recommended a follow up colonoscopy 04/2019.  Mammogram as outlined.

## 2014-05-17 NOTE — Assessment & Plan Note (Signed)
Last platelet count 129.  Recheck cbc.

## 2014-05-17 NOTE — Progress Notes (Signed)
Pre visit review using our clinic review tool, if applicable. No additional management support is needed unless otherwise documented below in the visit note. 

## 2014-05-17 NOTE — Assessment & Plan Note (Addendum)
Better.  Follow.  

## 2014-05-20 ENCOUNTER — Encounter: Payer: Self-pay | Admitting: Internal Medicine

## 2014-05-20 ENCOUNTER — Other Ambulatory Visit: Payer: Self-pay | Admitting: Internal Medicine

## 2014-05-20 DIAGNOSIS — M25562 Pain in left knee: Secondary | ICD-10-CM | POA: Insufficient documentation

## 2014-05-20 DIAGNOSIS — D696 Thrombocytopenia, unspecified: Secondary | ICD-10-CM

## 2014-05-20 NOTE — Assessment & Plan Note (Signed)
Had a colonoscopy 04/2009.  Two polyps removed.  Recommended a follow up colonoscopy 2020 (per GI).

## 2014-05-20 NOTE — Assessment & Plan Note (Signed)
Left breast mammo - Birads I in 01/25/14.  Due f/u bilateral mammogram in 8/15.

## 2014-05-20 NOTE — Assessment & Plan Note (Signed)
Persistent/worsening.  Had previous knee surgery.  Given worsening pain, will refer back to [facility] for further evaluation and treatment.

## 2014-05-20 NOTE — Assessment & Plan Note (Signed)
Discussed the need to quit smoking.  Discussed treatment options.  Not ready to quit at this time.  Does plan to cut down.

## 2014-05-20 NOTE — Progress Notes (Signed)
Order placed for f/u platelet count.  

## 2014-05-21 ENCOUNTER — Encounter: Payer: Self-pay | Admitting: *Deleted

## 2014-05-27 ENCOUNTER — Telehealth: Payer: Self-pay | Admitting: Internal Medicine

## 2014-05-27 DIAGNOSIS — D696 Thrombocytopenia, unspecified: Secondary | ICD-10-CM

## 2014-05-27 NOTE — Telephone Encounter (Signed)
Pt has persistent thrombocytopenia.  Agreeable to abdominal ultrasound.  Order placed.

## 2014-05-31 ENCOUNTER — Ambulatory Visit: Payer: Self-pay | Admitting: Internal Medicine

## 2014-06-01 ENCOUNTER — Encounter: Payer: Self-pay | Admitting: Internal Medicine

## 2014-06-04 ENCOUNTER — Encounter: Payer: Self-pay | Admitting: Internal Medicine

## 2014-06-19 ENCOUNTER — Telehealth: Payer: Self-pay | Admitting: *Deleted

## 2014-06-19 ENCOUNTER — Other Ambulatory Visit: Payer: No Typology Code available for payment source

## 2014-06-19 NOTE — Telephone Encounter (Signed)
Duplicate.  See previous phone message.

## 2014-06-19 NOTE — Telephone Encounter (Signed)
See below

## 2014-06-19 NOTE — Telephone Encounter (Signed)
Pt came in and thought she was going to be seeing the doc and found out it was just a lab appointment  and would like to have blood work done at her job if she can get an order faxed over [employer] [facility] Fax number: [phone]

## 2014-06-19 NOTE — Patient Instructions (Signed)
Pt requested that she wanted labs done at her job.

## 2014-06-19 NOTE — Telephone Encounter (Signed)
Lab order faxed & pt notified via voicemail

## 2014-06-19 NOTE — Telephone Encounter (Signed)
rx for f/u platelet count written and placed in your box.

## 2014-06-21 LAB — CBC AND DIFFERENTIAL: Platelets: 124 10*3/uL — AB (ref 150–399)

## 2014-06-22 ENCOUNTER — Encounter: Payer: Self-pay | Admitting: Internal Medicine

## 2014-06-25 ENCOUNTER — Telehealth: Payer: Self-pay | Admitting: Internal Medicine

## 2014-06-25 NOTE — Telephone Encounter (Signed)
Pt notified of lab results via my chart.  Platelet count still decreased but stable.  Will continue to follow.  Will need to recheck in 4-6 weeks to confirm stable.  [facility] [employer] form placed in your box.  She is to let us know if she wants to pick up or have it mailed to her.  Thanks.

## 2014-06-25 NOTE — Telephone Encounter (Signed)
We mail form & unread mychart to patient once [facility] from is received

## 2014-06-26 NOTE — Telephone Encounter (Signed)
[employer] form completed.

## 2014-11-11 ENCOUNTER — Other Ambulatory Visit: Payer: Self-pay | Admitting: Internal Medicine

## 2015-06-28 ENCOUNTER — Other Ambulatory Visit: Payer: Self-pay | Admitting: Internal Medicine

## 2015-06-28 NOTE — Telephone Encounter (Signed)
No labs or ov since 7/15

## 2015-06-28 NOTE — Telephone Encounter (Signed)
Needs lab appt and needs f/u appt scheduled with me.  Needs lab soon - within one week.  Once scheduled can refill until lab drawn.

## 2015-07-02 ENCOUNTER — Other Ambulatory Visit: Payer: Self-pay

## 2015-07-04 ENCOUNTER — Telehealth: Payer: Self-pay | Admitting: Surgical

## 2015-07-04 ENCOUNTER — Other Ambulatory Visit: Payer: Self-pay

## 2015-07-04 ENCOUNTER — Encounter: Payer: Self-pay | Admitting: *Deleted

## 2015-07-04 NOTE — Telephone Encounter (Signed)
See 06/28/15 refill message.  Pt needs a f/u appt and labs scheduled.  Once scheduled ok to refill until next app.  Needs labs within one week.

## 2015-07-04 NOTE — Telephone Encounter (Signed)
Already ordered

## 2015-07-04 NOTE — Telephone Encounter (Signed)
Pharmacy is faxing for a prescription refill on Triamterene-HCTZ. Patient has not been seen since 05/17/2014. Please advise on refill

## 2015-07-04 NOTE — Telephone Encounter (Signed)
Sent mychart message

## 2015-07-05 ENCOUNTER — Telehealth: Payer: Self-pay | Admitting: Internal Medicine

## 2015-07-05 ENCOUNTER — Telehealth: Payer: Self-pay

## 2015-07-05 NOTE — Telephone Encounter (Signed)
Lab appt scheduled 09/06/15 at 11:00. Thanks!

## 2015-07-05 NOTE — Telephone Encounter (Signed)
Patient left a message stating that she had called her pharmacy and they said that they had sent a refill request to our office about 3 days ago, but nothing has been sent in. Patient was very upset, stating that she has been without her medicine for 3 days, and that her medication needs to be sent in asap. Patient did not state what medication she needed. Patient requested a call back.

## 2015-07-05 NOTE — Telephone Encounter (Signed)
Yes that lab is fine..she just needs the follow up appt scheduled to get the refill.

## 2015-07-05 NOTE — Telephone Encounter (Signed)
Left message to call office

## 2015-07-05 NOTE — Telephone Encounter (Signed)
Notified patient of this. She states that she gets her labs done at [facility], and wants to know if that is okay? Patient stated that she will schedule a follow up appt.

## 2015-07-05 NOTE — Telephone Encounter (Signed)
Pt would like to have labs done prior to visit. Please send request to [facility]. Please advise pt/msn

## 2015-07-05 NOTE — Telephone Encounter (Signed)
See note.  Needs lab appt and f/u appt scheduled with me.  Thanks

## 2015-07-05 NOTE — Telephone Encounter (Signed)
Provider is out of office today, patient had pharmacy submit refill request for Triamterene-HCTZ, she needs to schedule a follow up appointment and labs and then Dr. Lorin Picket will refill till that appt.  Thanks. Kenney Houseman , RN

## 2015-07-08 NOTE — Telephone Encounter (Signed)
Mailed unread message to patient.  

## 2015-07-08 NOTE — Telephone Encounter (Signed)
Dr. Lorin Picket, Please advise on labs? thanks

## 2015-07-09 NOTE — Telephone Encounter (Signed)
rx written and placed in your box.  Thanks

## 2015-07-09 NOTE — Telephone Encounter (Signed)
Patient notified via phone need to schedule appointment.

## 2015-07-11 NOTE — Telephone Encounter (Signed)
[facility]

## 2015-07-15 NOTE — Telephone Encounter (Signed)
Please fax orders to [facility] . There fax number is [phone].

## 2015-07-26 LAB — TSH: TSH: 1.67 u[IU]/mL (ref 0.41–5.90)

## 2015-07-26 LAB — HEPATIC FUNCTION PANEL
ALT: 29 U/L (ref 7–35)
AST: 21 U/L (ref 13–35)
Alkaline Phosphatase: 87 U/L (ref 25–125)
BILIRUBIN, TOTAL: 0.3 mg/dL

## 2015-07-26 LAB — LIPID PANEL
Cholesterol: 241 mg/dL — AB (ref 0–200)
HDL: 78 mg/dL — AB (ref 35–70)
LDL Cholesterol: 146 mg/dL
TRIGLYCERIDES: 84 mg/dL (ref 40–160)

## 2015-07-26 LAB — BASIC METABOLIC PANEL
BUN: 19 mg/dL (ref 4–21)
CREATININE: 0.9 mg/dL (ref 0.5–1.1)
GLUCOSE: 108 mg/dL
POTASSIUM: 4 mmol/L (ref 3.4–5.3)
SODIUM: 141 mmol/L (ref 137–147)

## 2015-07-26 LAB — CBC AND DIFFERENTIAL
HCT: 39 % (ref 36–46)
HEMOGLOBIN: 12.7 g/dL (ref 12.0–16.0)
NEUTROS ABS: 2 /uL
Platelets: 153 10*3/uL (ref 150–399)
WBC: 4.9 10*3/mL

## 2015-08-02 ENCOUNTER — Encounter: Payer: Self-pay | Admitting: Internal Medicine

## 2015-08-02 ENCOUNTER — Other Ambulatory Visit: Payer: Self-pay | Admitting: Internal Medicine

## 2015-08-04 ENCOUNTER — Telehealth: Payer: Self-pay | Admitting: Internal Medicine

## 2015-08-04 NOTE — Telephone Encounter (Signed)
Pt notified of lab results via my chart.  Start cholesterol medication.

## 2015-08-06 NOTE — Telephone Encounter (Signed)
Unread mychart message mailed to patient 

## 2015-08-08 ENCOUNTER — Other Ambulatory Visit: Payer: Self-pay

## 2015-08-08 MED ORDER — TRIAMTERENE-HCTZ 37.5-25 MG PO TABS: 1.0000 | ORAL_TABLET | Freq: Every day | ORAL | Status: DC

## 2015-08-08 MED ORDER — POTASSIUM CHLORIDE CRYS ER 10 MEQ PO TBCR: 10.0000 meq | EXTENDED_RELEASE_TABLET | Freq: Every day | ORAL | Status: DC

## 2015-09-05 ENCOUNTER — Other Ambulatory Visit: Payer: Self-pay | Admitting: Internal Medicine

## 2015-09-06 ENCOUNTER — Ambulatory Visit (INDEPENDENT_AMBULATORY_CARE_PROVIDER_SITE_OTHER): Payer: No Typology Code available for payment source | Admitting: Internal Medicine

## 2015-09-06 ENCOUNTER — Encounter: Payer: Self-pay | Admitting: Internal Medicine

## 2015-09-06 VITALS — BP 110/75 | HR 78 | Temp 97.7°F | Ht 69.5 in | Wt >= 6400 oz

## 2015-09-06 DIAGNOSIS — I10 Essential (primary) hypertension: Secondary | ICD-10-CM

## 2015-09-06 DIAGNOSIS — R920 Mammographic microcalcification found on diagnostic imaging of breast: Secondary | ICD-10-CM | POA: Diagnosis not present

## 2015-09-06 DIAGNOSIS — Z86010 Personal history of colonic polyps: Secondary | ICD-10-CM

## 2015-09-06 DIAGNOSIS — R739 Hyperglycemia, unspecified: Secondary | ICD-10-CM

## 2015-09-06 DIAGNOSIS — Z1239 Encounter for other screening for malignant neoplasm of breast: Secondary | ICD-10-CM

## 2015-09-06 DIAGNOSIS — D696 Thrombocytopenia, unspecified: Secondary | ICD-10-CM

## 2015-09-06 NOTE — Progress Notes (Signed)
Pre-visit discussion using our clinic review tool. No additional management support is needed unless otherwise documented below in the visit note.  

## 2015-09-06 NOTE — Progress Notes (Signed)
Patient ID: SHIVON HACKEL, female   DOB: Sep 05, 1961, 54 y.o.   MRN: [id number]   Subjective:    Patient ID: Michaela Corner, female    DOB: [birth year], 54 y.o.   MRN: [id number]  HPI  Patient with past history of thrombocytopenia, tobacco use and hypertension who comes in today for a scheduled follow up of these issues.  I have not seen her since 05/17/14. She quit smoking 01/2015.  Has not smoked since.  Has gained weight since stopping.  We discussed diet and the need for weight loss.  Information given.  Discussed exercise.  No cardiac symptoms with increased activity or exertion.  No sob.  No acid reflux.  No abdominal pain or cramping.  Bowels stable.  Reviewed recent labs.  Cholesterol elevated.  She desires not to start cholesterol medication.  Wants to work on diet and exercise.      Past Medical History  Diagnosis Date  . Hypertension   . Thrombocytopenia (HCC)     Recent platelet count 151 on 11/01/08  . Asymptomatic cholelithiasis   . Tobacco use   . Mammographic microcalcification 2015   Past Surgical History  Procedure Laterality Date  . Knee surgery Left 3/11    left knee   . Breast biopsy Left 2012   Family History  Problem Relation Age of Onset  . Hypertension Mother   . Diabetes Mother   . Hypothyroidism Mother   . Heart disease Father     Died of a Heart Attack    Social History   Social History  . Marital Status: Single    Spouse Name: N/A  . Number of Children: N/A  . Years of Education: N/A   Social History Main Topics  . Smoking status: Former Smoker -- 0.25 packs/day for 12 years    Types: Cigarettes  . Smokeless tobacco: Never Used     Comment: Quit March 2016  . Alcohol Use: 0.0 oz/week    0 Standard drinks or equivalent per week     Comment: occasionally  . Drug Use: No  . Sexual Activity: Not Asked   Other Topics Concern  . None   Social History Narrative    Outpatient Encounter Prescriptions as of 09/06/2015  Medication Sig  .  KLOR-CON M10 10 MEQ tablet TAKE 1 TABLET (10 MEQ TOTAL) BY MOUTH DAILY.**NEEDS TO KEEP APPOINTMENT**  . Omega-3 Fatty Acids (FISH OIL) 1000 MG CAPS Take 2 capsules by mouth daily.  . Pediatric Multiple Vitamins (CHEWABLE MULTIPLE VITAMINS PO) Take 1 tablet by mouth daily.  [name] triamterene-hydrochlorothiazide (MAXZIDE-25) 37.5-25 MG tablet TAKE 1 TABLET BY MOUTH DAILY.**NEEDS TO KEEP APPOINTMENT**  . cyanocobalamin 500 MCG tablet Take 500 mcg by mouth daily.   No facility-administered encounter medications on file as of 09/06/2015.    Review of Systems  Constitutional: Negative for fever.       Has gained weight since stopping smoking.    HENT: Negative for congestion and sinus pressure.   Eyes: Negative for discharge and visual disturbance.  Respiratory: Negative for cough, chest tightness and shortness of breath.   Cardiovascular: Negative for chest pain, palpitations and leg swelling.  Gastrointestinal: Negative for nausea, vomiting, abdominal pain and diarrhea.  Genitourinary: Negative for dysuria and difficulty urinating.  Musculoskeletal: Negative for back pain and joint swelling.  Skin: Negative for color change and rash.  Neurological: Negative for dizziness, light-headedness and headaches.  Psychiatric/Behavioral: Negative for dysphoric mood and agitation.       Objective:  Physical Exam  Constitutional: She appears well-developed and well-nourished. No distress.  HENT:  Nose: Nose normal.  Mouth/Throat: Oropharynx is clear and moist.  Eyes: Conjunctivae are normal. Right eye exhibits no discharge. Left eye exhibits no discharge.  Neck: Neck supple. No thyromegaly present.  Cardiovascular: Normal rate and regular rhythm.   Pulmonary/Chest: Breath sounds normal. No respiratory distress. She has no wheezes.  Abdominal: Soft. Bowel sounds are normal. There is no tenderness.  Musculoskeletal: She exhibits no edema or tenderness.  Lymphadenopathy:    She has no cervical  adenopathy.  Skin: No rash noted. No erythema.  Psychiatric: She has a normal mood and affect. Her behavior is normal.    BP 110/75 mmHg  Pulse 78  Temp(Src) 97.7 F (36.5 C) (Oral)  Ht 5' 9.5" (1.765 m)  Wt 405 lb (183.707 kg)  BMI 58.97 kg/m2  SpO2 97% Wt Readings from Last 3 Encounters:  09/06/15 405 lb (183.707 kg)  05/17/14 382 lb 4 oz (173.387 kg)  02/07/14 385 lb (174.635 kg)     Lab Results  Component Value Date   WBC 4.9 07/26/2015   HGB 12.7 07/26/2015   HCT 39 07/26/2015   PLT 153 07/26/2015   GLUCOSE 101* 05/17/2014   CHOL 241* 07/26/2015   TRIG 84 07/26/2015   HDL 78* 07/26/2015   LDLDIRECT 140.7 07/13/2013   LDLCALC 146 07/26/2015   ALT 29 07/26/2015   AST 21 07/26/2015   NA 141 07/26/2015   K 4.0 07/26/2015   CL 104 05/17/2014   CREATININE 0.9 07/26/2015   BUN 19 07/26/2015   CO2 31 05/17/2014   TSH 1.67 07/26/2015       Assessment & Plan:   Problem List Items Addressed This Visit    Essential hypertension, benign    Blood pressure under good control.  Continue same medication regimen.  Follow pressures.  Follow metabolic panel.        Hyperglycemia    Low carb diet and exercise.  Recheck fasting glucose and a1c.        Microcalcifications of the breast    Evaluated by Dr Lemar LivingsByrnett.  Mammogram left breast 01/25/14 - Birads I.   Overdue mammogram.  Schedule.        Obesity, morbid, BMI 50 or higher (HCC)    Discussed diet and exercise.  Follow.        Personal history of colonic polyps    Had a colonoscopy 04/2009.  Two polyps removed.  Recommended f/u colonoscopy in 2020.        Thrombocytopenia, unspecified (HCC)    Recheck cbc.         Other Visit Diagnoses    Screening breast examination    -  Primary    Relevant Orders    MM DIGITAL SCREENING BILATERAL        Dale DurhamSCOTT, Shauntia Levengood, MD

## 2015-09-08 ENCOUNTER — Encounter: Payer: Self-pay | Admitting: Internal Medicine

## 2015-09-08 DIAGNOSIS — R739 Hyperglycemia, unspecified: Secondary | ICD-10-CM | POA: Insufficient documentation

## 2015-09-08 DIAGNOSIS — Z6841 Body Mass Index (BMI) 40.0 and over, adult: Secondary | ICD-10-CM | POA: Insufficient documentation

## 2015-09-08 NOTE — Assessment & Plan Note (Signed)
Blood pressure under good control.  Continue same medication regimen.  Follow pressures.  Follow metabolic panel.   

## 2015-09-08 NOTE — Assessment & Plan Note (Signed)
Low carb diet and exercise.  Recheck fasting glucose and a1c.

## 2015-09-08 NOTE — Assessment & Plan Note (Signed)
Evaluated by Dr Lemar LivingsByrnett.  Mammogram left breast 01/25/14 - Birads I.   Overdue mammogram.  Schedule.

## 2015-09-08 NOTE — Assessment & Plan Note (Signed)
Had a colonoscopy 04/2009.  Two polyps removed.  Recommended f/u colonoscopy in 2020.

## 2015-09-08 NOTE — Assessment & Plan Note (Signed)
Recheck cbc.  

## 2015-09-08 NOTE — Assessment & Plan Note (Signed)
Discussed diet and exercise.  Follow.  

## 2015-09-13 ENCOUNTER — Ambulatory Visit
Admission: RE | Admit: 2015-09-13 | Discharge: 2015-09-13 | Disposition: A | Discharge status: Home / Self Care | Payer: No Typology Code available for payment source | Source: Ambulatory Visit | Attending: Internal Medicine | Admitting: Internal Medicine

## 2015-09-13 DIAGNOSIS — Z1231 Encounter for screening mammogram for malignant neoplasm of breast: Secondary | ICD-10-CM | POA: Insufficient documentation

## 2015-09-13 DIAGNOSIS — Z1239 Encounter for other screening for malignant neoplasm of breast: Secondary | ICD-10-CM

## 2015-09-14 ENCOUNTER — Encounter: Payer: Self-pay | Admitting: Internal Medicine

## 2015-09-14 DIAGNOSIS — Z0000 Encounter for general adult medical examination without abnormal findings: Secondary | ICD-10-CM | POA: Insufficient documentation

## 2015-10-10 ENCOUNTER — Other Ambulatory Visit: Payer: Self-pay | Admitting: Internal Medicine

## 2015-11-14 ENCOUNTER — Encounter: Payer: No Typology Code available for payment source | Admitting: Internal Medicine

## 2015-11-27 ENCOUNTER — Other Ambulatory Visit (HOSPITAL_COMMUNITY)
Admission: RE | Admit: 2015-11-27 | Discharge: 2015-11-27 | Disposition: A | Discharge status: Home / Self Care | Payer: Managed Care, Other (non HMO) | Source: Ambulatory Visit | Attending: Internal Medicine | Admitting: Internal Medicine

## 2015-11-27 ENCOUNTER — Ambulatory Visit (INDEPENDENT_AMBULATORY_CARE_PROVIDER_SITE_OTHER): Payer: Managed Care, Other (non HMO) | Admitting: Internal Medicine

## 2015-11-27 ENCOUNTER — Encounter: Payer: Self-pay | Admitting: Internal Medicine

## 2015-11-27 VITALS — BP 112/75 | HR 81 | Temp 98.5°F | Resp 18 | Ht 67.5 in | Wt >= 6400 oz

## 2015-11-27 DIAGNOSIS — R920 Mammographic microcalcification found on diagnostic imaging of breast: Secondary | ICD-10-CM

## 2015-11-27 DIAGNOSIS — I10 Essential (primary) hypertension: Secondary | ICD-10-CM | POA: Diagnosis not present

## 2015-11-27 DIAGNOSIS — Z124 Encounter for screening for malignant neoplasm of cervix: Secondary | ICD-10-CM | POA: Diagnosis not present

## 2015-11-27 DIAGNOSIS — R739 Hyperglycemia, unspecified: Secondary | ICD-10-CM

## 2015-11-27 DIAGNOSIS — Z0000 Encounter for general adult medical examination without abnormal findings: Secondary | ICD-10-CM | POA: Diagnosis not present

## 2015-11-27 DIAGNOSIS — Z1211 Encounter for screening for malignant neoplasm of colon: Secondary | ICD-10-CM

## 2015-11-27 DIAGNOSIS — Z01411 Encounter for gynecological examination (general) (routine) with abnormal findings: Secondary | ICD-10-CM | POA: Insufficient documentation

## 2015-11-27 DIAGNOSIS — Z1151 Encounter for screening for human papillomavirus (HPV): Secondary | ICD-10-CM | POA: Insufficient documentation

## 2015-11-27 DIAGNOSIS — D696 Thrombocytopenia, unspecified: Secondary | ICD-10-CM

## 2015-11-27 DIAGNOSIS — Z86010 Personal history of colonic polyps: Secondary | ICD-10-CM

## 2015-11-27 NOTE — Progress Notes (Signed)
Pre-visit discussion using our clinic review tool. No additional management support is needed unless otherwise documented below in the visit note.  

## 2015-11-27 NOTE — Progress Notes (Signed)
Patient ID: Morgan Duncan, female   DOB: [birth year], 55 y.o.   MRN: [id number]   Subjective:    Patient ID: Morgan Duncan, female    DOB: 18-Jun-1961, 55 y.o.   MRN: [id number]  HPI  Patient with past history of hypertension and hyperglycemia.  She comes in today to follow up on these issues as well as for a complete physical exam.  She has noticed some hoarseness and drainage.  Discussed mucinex.  No chest congestion.  No sob.  No chest pain or tightness.  No acid reflux.  No abdominal pain or cramping.  Taking metamucil for her bowels.  Discussed diet and exercise.     Past Medical History  Diagnosis Date  . Hypertension   . Thrombocytopenia ([place])     Recent platelet count 151 on 11/01/08  . Asymptomatic cholelithiasis   . Tobacco use   . Mammographic microcalcification 2015   Past Surgical History  Procedure Laterality Date  . Knee surgery Left 3/11    left knee   . Breast biopsy Left 2012    NEG   Family History  Problem Relation Age of Onset  . Hypertension Mother   . Diabetes Mother   . Hypothyroidism Mother   . Heart disease Father     Died of a Heart Attack   . Breast cancer Neg Hx    Social History   Social History  . Marital Status: Single    Spouse Name: N/A  . Number of Children: N/A  . Years of Education: N/A   Social History Main Topics  . Smoking status: Former Smoker -- 0.25 packs/day for 12 years    Types: Cigarettes  . Smokeless tobacco: Never Used     Comment: Quit March 2016  . Alcohol Use: 0.0 oz/week    0 Standard drinks or equivalent per week     Comment: occasionally  . Drug Use: No  . Sexual Activity: Not Asked   Other Topics Concern  . None   Social History Narrative    Outpatient Encounter Prescriptions as of 11/27/2015  Medication Sig  . ascorbic acid (VITAMIN C) 250 MG CHEW Chew 250 mg by mouth daily.  . cyanocobalamin 500 MCG tablet Take 500 mcg by mouth daily.  . naproxen (NAPROSYN) 500 MG tablet Take 500 mg by mouth 2  (two) times daily as needed.  . Omega-3 Fatty Acids (FISH OIL) 1000 MG CAPS Take 2 capsules by mouth daily.  . Pediatric Multiple Vitamins (CHEWABLE MULTIPLE VITAMINS PO) Take 1 tablet by mouth daily.  . potassium chloride (KLOR-CON M10) 10 MEQ tablet Take 1 tablet (10 mEq total) by mouth daily.  [name] tobramycin-dexamethasone (TOBRADEX) ophthalmic solution APPLY 1 DROP(S) IN LEFT EYE 4 TIMES A DAY  . triamterene-hydrochlorothiazide (MAXZIDE-25) 37.5-25 MG tablet Take 1 tablet by mouth daily.   No facility-administered encounter medications on file as of 11/27/2015.    Review of Systems  Constitutional: Negative for appetite change and unexpected weight change.  HENT: Positive for postnasal drip. Negative for congestion and sinus pressure.   Eyes: Negative for pain and visual disturbance.  Respiratory: Negative for cough, chest tightness and shortness of breath.   Cardiovascular: Negative for chest pain, palpitations and leg swelling.  Gastrointestinal: Negative for nausea, vomiting, abdominal pain and diarrhea.  Genitourinary: Negative for dysuria and difficulty urinating.  Musculoskeletal: Negative for back pain and joint swelling.  Skin: Negative for color change and rash.  Neurological: Negative for dizziness, light-headedness and headaches.  Hematological:  Negative for adenopathy. Does not bruise/bleed easily.  Psychiatric/Behavioral: Negative for dysphoric mood and agitation.       Objective:    Physical Exam  Constitutional: She is oriented to person, place, and time. She appears well-developed and well-nourished. No distress.  HENT:  Nose: Nose normal.  Mouth/Throat: Oropharynx is clear and moist.  Eyes: Right eye exhibits no discharge. Left eye exhibits no discharge. No scleral icterus.  Neck: Neck supple. No thyromegaly present.  Cardiovascular: Normal rate and regular rhythm.   Pulmonary/Chest: Breath sounds normal. No accessory muscle usage. No tachypnea. No respiratory  distress. She has no decreased breath sounds. She has no wheezes. She has no rhonchi. Right breast exhibits no inverted nipple, no mass, no nipple discharge and no tenderness (no axillary adenopathy). Left breast exhibits no inverted nipple, no mass, no nipple discharge and no tenderness (no axilarry adenopathy).  Abdominal: Soft. Bowel sounds are normal. There is no tenderness.  Genitourinary:  Normal external genitalia.  Vaginal vault without lesions.  Cervix identified.  Pap smear performed.  Could not appreciate any adnexal masses or tenderness.    Musculoskeletal: She exhibits no edema or tenderness.  Lymphadenopathy:    She has no cervical adenopathy.  Neurological: She is alert and oriented to person, place, and time.  Skin: Skin is warm. No rash noted. No erythema.  Psychiatric: She has a normal mood and affect. Her behavior is normal.    BP 112/75 mmHg  Pulse 81  Temp(Src) 98.5 F (36.9 C) (Oral)  Resp 18  Ht 5' 7.5" (1.715 m)  Wt 409 lb 6 oz (185.691 kg)  BMI 63.13 kg/m2  SpO2 96% Wt Readings from Last 3 Encounters:  11/27/15 409 lb 6 oz (185.691 kg)  09/06/15 405 lb (183.707 kg)  05/17/14 382 lb 4 oz (173.387 kg)     Lab Results  Component Value Date   WBC 4.9 07/26/2015   HGB 12.7 07/26/2015   HCT 39 07/26/2015   PLT 153 07/26/2015   GLUCOSE 101* 05/17/2014   CHOL 241* 07/26/2015   TRIG 84 07/26/2015   HDL 78* 07/26/2015   LDLDIRECT 140.7 07/13/2013   LDLCALC 146 07/26/2015   ALT 29 07/26/2015   AST 21 07/26/2015   NA 141 07/26/2015   K 4.0 07/26/2015   CL 104 05/17/2014   CREATININE 0.9 07/26/2015   BUN 19 07/26/2015   CO2 31 05/17/2014   TSH 1.67 07/26/2015    Mm Digital Screening Bilateral  09/13/2015  CLINICAL DATA:  Screening. EXAM: DIGITAL SCREENING BILATERAL MAMMOGRAM WITH CAD COMPARISON:  Previous exam(s). ACR Breast Density Category b: There are scattered areas of fibroglandular density. FINDINGS: There are no findings suspicious for  malignancy. Images were processed with CAD. IMPRESSION: No mammographic evidence of malignancy. A result letter of this screening mammogram will be mailed directly to the patient. RECOMMENDATION: Screening mammogram in one year. (Code:SM-B-01Y) BI-RADS CATEGORY  1: Negative. Electronically Signed   By: Fidela Salisbury M.D.   On: 09/13/2015 15:00       Assessment & Plan:   Problem List Items Addressed This Visit    Essential hypertension, benign    Blood pressure under good control.  Continue same medication regimen.  Follow pressures.  Follow metabolic panel.        Health care maintenance    Physical today 11/27/15.  PAP 11/27/15.  Mammogram 09/13/15 - Birads I. Colonoscopy 04/2019.  Recommended f/u colonoscopy in 2020.        Hyperglycemia  Low carb diet and exercise.  Follow met b and a1c.       Microcalcifications of the breast    Evaluated by Dr Bary Castilla.  Mammogram 09/13/15 - Birads I.       Obesity, morbid, BMI 50 or higher ([place])    Discussed diet and exercise.        Personal history of colonic polyps    Had colonoscopy 04/2009.  Two polyps removed.  Recommended f/u colonoscopy in 2020.  IFOB.        Thrombocytopenia, unspecified ([place])    Recheck cbc.        Other Visit Diagnoses    Colon cancer screening    -  Primary    Relevant Orders    Fecal occult blood, imunochemical    Pap smear for cervical cancer screening        Relevant Orders    Cytology - PAP (Completed)        Einar Pheasant, MD

## 2015-11-28 LAB — CYTOLOGY - PAP

## 2015-11-29 ENCOUNTER — Encounter: Payer: Self-pay | Admitting: Internal Medicine

## 2015-12-04 NOTE — Telephone Encounter (Signed)
Unread mychart message mailed to patient 

## 2015-12-09 ENCOUNTER — Encounter: Payer: Self-pay | Admitting: Internal Medicine

## 2015-12-09 NOTE — Assessment & Plan Note (Signed)
Recheck cbc.  

## 2015-12-09 NOTE — Assessment & Plan Note (Signed)
Blood pressure under good control.  Continue same medication regimen.  Follow pressures.  Follow metabolic panel.   

## 2015-12-09 NOTE — Assessment & Plan Note (Signed)
Low carb diet and exercise.  Follow met b and a1c.  

## 2015-12-09 NOTE — Assessment & Plan Note (Signed)
Had colonoscopy 04/2009.  Two polyps removed.  Recommended f/u colonoscopy in 2020.  IFOB.

## 2015-12-09 NOTE — Assessment & Plan Note (Signed)
Evaluated by Dr Lemar Livings.  Mammogram 09/13/15 - Birads I.

## 2015-12-09 NOTE — Assessment & Plan Note (Signed)
Physical today 11/27/15.  PAP 11/27/15.  Mammogram 09/13/15 - Birads I. Colonoscopy 04/2019.  Recommended f/u colonoscopy in 2020.

## 2015-12-09 NOTE — Assessment & Plan Note (Signed)
Discussed diet and exercise 

## 2016-01-15 ENCOUNTER — Other Ambulatory Visit: Payer: Self-pay | Admitting: Internal Medicine

## 2016-03-26 ENCOUNTER — Encounter: Payer: Self-pay | Admitting: Internal Medicine

## 2016-03-26 ENCOUNTER — Ambulatory Visit (INDEPENDENT_AMBULATORY_CARE_PROVIDER_SITE_OTHER): Payer: Managed Care, Other (non HMO) | Admitting: Internal Medicine

## 2016-03-26 VITALS — BP 122/70 | HR 80 | Temp 98.3°F | Resp 18 | Ht 67.5 in | Wt >= 6400 oz

## 2016-03-26 DIAGNOSIS — E7800 Pure hypercholesterolemia, unspecified: Secondary | ICD-10-CM

## 2016-03-26 DIAGNOSIS — Z86010 Personal history of colonic polyps: Secondary | ICD-10-CM

## 2016-03-26 DIAGNOSIS — I10 Essential (primary) hypertension: Secondary | ICD-10-CM

## 2016-03-26 DIAGNOSIS — D696 Thrombocytopenia, unspecified: Secondary | ICD-10-CM | POA: Diagnosis not present

## 2016-03-26 DIAGNOSIS — R739 Hyperglycemia, unspecified: Secondary | ICD-10-CM

## 2016-03-26 NOTE — Progress Notes (Signed)
Pre-visit discussion using our clinic review tool. No additional management support is needed unless otherwise documented below in the visit note.  

## 2016-03-26 NOTE — Progress Notes (Signed)
Patient ID: Morgan Duncan, female   DOB: 04-01-1961, 55 y.o.   MRN: [id number]   Subjective:    Patient ID: Morgan Duncan, female    DOB: Apr 05, 1961, 55 y.o.   MRN: [id number]  HPI  Patient here for a scheduled follow up.  She is doing well.  Has adjusted her diet.  Lost weight.  Feels better.  Not smoking.  No chest pain.  No sob.  No increased cough or congestion.  No acid reflux reported.  No abdominal pain or cramping.  Bowels stable.  She is exercising.  Going to the gym 4 days/week.   Past Medical History  Diagnosis Date  . Hypertension   . Thrombocytopenia ([place])     Recent platelet count 151 on 11/01/08  . Asymptomatic cholelithiasis   . Tobacco use   . Mammographic microcalcification 2015   Past Surgical History  Procedure Laterality Date  . Knee surgery Left 3/11    left knee   . Breast biopsy Left 2012    NEG   Family History  Problem Relation Age of Onset  . Hypertension Mother   . Diabetes Mother   . Hypothyroidism Mother   . Heart disease Father     Died of a Heart Attack   . Breast cancer Neg Hx    Social History   Social History  . Marital Status: Single    Spouse Name: N/A  . Number of Children: N/A  . Years of Education: N/A   Social History Main Topics  . Smoking status: Former Smoker -- 0.25 packs/day for 12 years    Types: Cigarettes  . Smokeless tobacco: Never Used     Comment: Quit March 2016  . Alcohol Use: 0.0 oz/week    0 Standard drinks or equivalent per week     Comment: occasionally  . Drug Use: No  . Sexual Activity: Not Asked   Other Topics Concern  . None   Social History Narrative    Outpatient Encounter Prescriptions as of 03/26/2016  Medication Sig  . ascorbic acid (VITAMIN C) 250 MG CHEW Chew 250 mg by mouth daily.  . cyanocobalamin 500 MCG tablet Take 500 mcg by mouth daily.  [name] KLOR-CON M10 10 MEQ tablet TAKE 1 TABLET (10 MEQ TOTAL) BY MOUTH DAILY.  . naproxen (NAPROSYN) 500 MG tablet Take 500 mg by mouth 2 (two)  times daily as needed.  . Omega-3 Fatty Acids (FISH OIL) 1000 MG CAPS Take 2 capsules by mouth daily.  . Pediatric Multiple Vitamins (CHEWABLE MULTIPLE VITAMINS PO) Take 1 tablet by mouth daily.  [name] triamterene-hydrochlorothiazide (MAXZIDE-25) 37.5-25 MG tablet TAKE 1 TABLET BY MOUTH DAILY.  . [DISCONTINUED] tobramycin-dexamethasone (TOBRADEX) ophthalmic solution APPLY 1 DROP(S) IN LEFT EYE 4 TIMES A DAY   No facility-administered encounter medications on file as of 03/26/2016.    Review of Systems  Constitutional:       Has adjusted her diet.  Lost weight.    HENT: Negative for congestion and sinus pressure.   Respiratory: Negative for cough, chest tightness and shortness of breath.   Cardiovascular: Negative for chest pain, palpitations and leg swelling.  Gastrointestinal: Negative for nausea, vomiting, abdominal pain and diarrhea.  Genitourinary: Negative for dysuria and difficulty urinating.  Musculoskeletal: Negative for back pain and joint swelling.  Skin: Negative for color change and rash.  Neurological: Negative for dizziness, light-headedness and headaches.  Psychiatric/Behavioral: Negative for dysphoric mood and agitation.       Objective:  Physical Exam  Constitutional: She appears well-developed and well-nourished. No distress.  HENT:  Nose: Nose normal.  Mouth/Throat: Oropharynx is clear and moist.  Neck: Neck supple. No thyromegaly present.  Cardiovascular: Normal rate and regular rhythm.   Pulmonary/Chest: Breath sounds normal. No respiratory distress. She has no wheezes.  Abdominal: Soft. Bowel sounds are normal. There is no tenderness.  Musculoskeletal: She exhibits no edema or tenderness.  Lymphadenopathy:    She has no cervical adenopathy.  Skin: No rash noted. No erythema.  Psychiatric: She has a normal mood and affect. Her behavior is normal.    BP 122/70 mmHg  Pulse 80  Temp(Src) 98.3 F (36.8 C) (Oral)  Resp 18  Ht 5' 7.5" (1.715 m)  Wt 402 lb 4  oz (182.459 kg)  BMI 62.04 kg/m2  SpO2 97% Wt Readings from Last 3 Encounters:  03/26/16 402 lb 4 oz (182.459 kg)  11/27/15 409 lb 6 oz (185.691 kg)  09/06/15 405 lb (183.707 kg)     Lab Results  Component Value Date   WBC 5.1 03/27/2016   HGB 12.7 07/26/2015   HCT 38.2 03/27/2016   PLT 132* 03/27/2016   GLUCOSE 97 03/27/2016   CHOL 200* 03/27/2016   TRIG 136 03/27/2016   HDL 61 03/27/2016   LDLDIRECT 140.7 07/13/2013   LDLCALC 112* 03/27/2016   ALT 14 03/27/2016   AST 17 03/27/2016   NA 143 03/27/2016   K 3.9 03/27/2016   CL 102 03/27/2016   CREATININE 0.98 03/27/2016   BUN 18 03/27/2016   CO2 31 05/17/2014   TSH 1.080 03/27/2016       Assessment & Plan:   Problem List Items Addressed This Visit    Essential hypertension, benign    Blood pressure under good control.  Continue same medication regimen.  Follow pressures.  Follow metabolic panel.        Hypercholesteremia - Primary    She has adjusted her diet.  Lost weight.  Check lipid panel.       Hyperglycemia    Low carb diet and exercise.  Follow met b and a1c.       Obesity, morbid, BMI 50 or higher ([place])    She has adjusted her diet.  Is going to the gym.  Lost some weight.  Continue diet and exercise.        Personal history of colonic polyps    Colonoscopy 04/2009.  Recommended f/u colonoscopy in 2020.        Thrombocytopenia, unspecified ([place])    Recheck cbc.            Einar Pheasant, MD

## 2016-03-26 NOTE — Assessment & Plan Note (Signed)
She has adjusted her diet.  Lost weight.  Check lipid panel.   

## 2016-03-27 ENCOUNTER — Other Ambulatory Visit: Payer: Self-pay

## 2016-03-27 DIAGNOSIS — Z299 Encounter for prophylactic measures, unspecified: Secondary | ICD-10-CM

## 2016-03-27 NOTE — Progress Notes (Signed)
Patient came in to have blood drawn per Dr. Charlene Scott's orders.  Blood was drawn from the right arm without any incident. 

## 2016-03-28 LAB — CMP12+LP+TP+TSH+6AC+CBC/D/PLT
ALBUMIN: 4.2 g/dL (ref 3.5–5.5)
ALT: 14 IU/L (ref 0–32)
AST: 17 IU/L (ref 0–40)
Albumin/Globulin Ratio: 1.5 (ref 1.2–2.2)
Alkaline Phosphatase: 87 IU/L (ref 39–117)
BASOS: 0 %
BILIRUBIN TOTAL: 0.3 mg/dL (ref 0.0–1.2)
BUN / CREAT RATIO: 18 (ref 9–23)
BUN: 18 mg/dL (ref 6–24)
Basophils Absolute: 0 10*3/uL (ref 0.0–0.2)
CHLORIDE: 102 mmol/L (ref 96–106)
CHOL/HDL RATIO: 3.3 ratio (ref 0.0–4.4)
CREATININE: 0.98 mg/dL (ref 0.57–1.00)
Calcium: 9.1 mg/dL (ref 8.7–10.2)
Cholesterol, Total: 200 mg/dL — ABNORMAL HIGH (ref 100–199)
EOS (ABSOLUTE): 0.1 10*3/uL (ref 0.0–0.4)
EOS: 2 %
Estimated CHD Risk: <0.5 times avg. (ref 0.0–1.0)
Free Thyroxine Index: 1.8 (ref 1.2–4.9)
GFR calc Af Amer: 76 mL/min/{1.73_m2} (ref >59)
GFR, EST NON AFRICAN AMERICAN: 66 mL/min/{1.73_m2} (ref >59)
GGT: 24 IU/L (ref 0–60)
GLOBULIN, TOTAL: 2.8 g/dL (ref 1.5–4.5)
Glucose: 97 mg/dL (ref 65–99)
HDL: 61 mg/dL (ref >39)
HEMATOCRIT: 38.2 % (ref 34.0–46.6)
Hemoglobin: 12.4 g/dL (ref 11.1–15.9)
IMMATURE GRANS (ABS): 0 10*3/uL (ref 0.0–0.1)
Immature Granulocytes: 0 %
Iron: 68 ug/dL (ref 27–159)
LDH: 205 IU/L (ref 119–226)
LDL CALC: 112 mg/dL — AB (ref 0–99)
Lymphocytes Absolute: 1.8 10*3/uL (ref 0.7–3.1)
Lymphs: 35 %
MCH: 28.2 pg (ref 26.6–33.0)
MCHC: 32.5 g/dL (ref 31.5–35.7)
MCV: 87 fL (ref 79–97)
MONOCYTES: 6 %
Monocytes Absolute: 0.3 10*3/uL (ref 0.1–0.9)
NEUTROS ABS: 2.9 10*3/uL (ref 1.4–7.0)
Neutrophils: 57 %
POTASSIUM: 3.9 mmol/L (ref 3.5–5.2)
Phosphorus: 3.8 mg/dL (ref 2.5–4.5)
Platelets: 132 10*3/uL — ABNORMAL LOW (ref 150–379)
RBC: 4.4 x10E6/uL (ref 3.77–5.28)
RDW: 14.6 % (ref 12.3–15.4)
SODIUM: 143 mmol/L (ref 134–144)
T3 Uptake Ratio: 31 % (ref 24–39)
T4 TOTAL: 5.9 ug/dL (ref 4.5–12.0)
TOTAL PROTEIN: 7 g/dL (ref 6.0–8.5)
TSH: 1.08 u[IU]/mL (ref 0.450–4.500)
Triglycerides: 136 mg/dL (ref 0–149)
URIC ACID: 5 mg/dL (ref 2.5–7.1)
VLDL Cholesterol Cal: 27 mg/dL (ref 5–40)
WBC: 5.1 10*3/uL (ref 3.4–10.8)

## 2016-03-29 ENCOUNTER — Encounter: Payer: Self-pay | Admitting: Internal Medicine

## 2016-03-29 NOTE — Assessment & Plan Note (Signed)
Blood pressure under good control.  Continue same medication regimen.  Follow pressures.  Follow metabolic panel.   

## 2016-03-29 NOTE — Assessment & Plan Note (Signed)
Low carb diet and exercise.  Follow met b and a1c.  

## 2016-03-29 NOTE — Assessment & Plan Note (Signed)
Colonoscopy 04/2009.  Recommended f/u colonoscopy in 2020.

## 2016-03-29 NOTE — Assessment & Plan Note (Signed)
She has adjusted her diet.  Is going to the gym.  Lost some weight.  Continue diet and exercise.

## 2016-03-29 NOTE — Assessment & Plan Note (Signed)
Recheck cbc.  

## 2016-03-30 ENCOUNTER — Encounter: Payer: Self-pay | Admitting: *Deleted

## 2016-04-02 NOTE — Telephone Encounter (Signed)
Unread mychart message mailed to patient 

## 2016-07-29 ENCOUNTER — Ambulatory Visit (INDEPENDENT_AMBULATORY_CARE_PROVIDER_SITE_OTHER): Payer: Managed Care, Other (non HMO) | Admitting: Internal Medicine

## 2016-07-29 ENCOUNTER — Encounter: Payer: Self-pay | Admitting: Internal Medicine

## 2016-07-29 DIAGNOSIS — R739 Hyperglycemia, unspecified: Secondary | ICD-10-CM

## 2016-07-29 DIAGNOSIS — D696 Thrombocytopenia, unspecified: Secondary | ICD-10-CM

## 2016-07-29 DIAGNOSIS — E7800 Pure hypercholesterolemia, unspecified: Secondary | ICD-10-CM | POA: Diagnosis not present

## 2016-07-29 DIAGNOSIS — I10 Essential (primary) hypertension: Secondary | ICD-10-CM

## 2016-07-29 NOTE — Assessment & Plan Note (Signed)
Low cholesterol diet and exercise.  Follow lipid panel.   

## 2016-07-29 NOTE — Progress Notes (Signed)
Pre visit review using our clinic review tool, if applicable. No additional management support is needed unless otherwise documented below in the visit note. 

## 2016-07-29 NOTE — Assessment & Plan Note (Signed)
Recheck cbc.  

## 2016-07-29 NOTE — Progress Notes (Signed)
Patient ID: LEEANDRA Duncan, female   DOB: [birth year], 55 y.o.   MRN: [id number]   Subjective:    Patient ID: Morgan Duncan, female    DOB: 06-11-1961, 55 y.o.   MRN: [id number]  HPI  Patient here for a scheduled follow up.   She states she is doing well.  Is exercising.  Working with a Clinical research associate.  Going 5 days per week.  More energy.  No chest pain.  No sob.  No acid reflux.  No abdominal pain or cramping.  Weight stable from last check.  Watching her diet.  Discussed diet today.     Past Medical History:  Diagnosis Date  . Asymptomatic cholelithiasis   . Hypertension   . Mammographic microcalcification 2015  . Thrombocytopenia ([place])    Recent platelet count 151 on 11/01/08  . Tobacco use    Past Surgical History:  Procedure Laterality Date  . BREAST BIOPSY Left 2012   NEG  . KNEE SURGERY Left 3/11   left knee    Family History  Problem Relation Age of Onset  . Hypertension Mother   . Diabetes Mother   . Hypothyroidism Mother   . Heart disease Father     Died of a Heart Attack   . Breast cancer Neg Hx    Social History   Social History  . Marital status: Single    Spouse name: N/A  . Number of children: N/A  . Years of education: N/A   Social History Main Topics  . Smoking status: Former Smoker    Packs/day: 0.25    Years: 12.00    Types: Cigarettes  . Smokeless tobacco: Never Used     Comment: Quit March 2016  . Alcohol use 0.0 oz/week     Comment: occasionally  . Drug use: No  . Sexual activity: Not Asked   Other Topics Concern  . None   Social History Narrative  . None    Outpatient Encounter Prescriptions as of 07/29/2016  Medication Sig  . ascorbic acid (VITAMIN C) 250 MG CHEW Chew 250 mg by mouth daily.  . cyanocobalamin 500 MCG tablet Take 500 mcg by mouth daily.  [name] KLOR-CON M10 10 MEQ tablet TAKE 1 TABLET (10 MEQ TOTAL) BY MOUTH DAILY.  . naproxen (NAPROSYN) 500 MG tablet Take 500 mg by mouth 2 (two) times daily as needed.  . Omega-3 Fatty  Acids (FISH OIL) 1000 MG CAPS Take 2 capsules by mouth daily.  . Pediatric Multiple Vitamins (CHEWABLE MULTIPLE VITAMINS PO) Take 1 tablet by mouth daily.  [name] triamterene-hydrochlorothiazide (MAXZIDE-25) 37.5-25 MG tablet TAKE 1 TABLET BY MOUTH DAILY.   No facility-administered encounter medications on file as of 07/29/2016.     Review of Systems  Constitutional: Negative for appetite change and unexpected weight change.  HENT: Negative for congestion and sinus pressure.   Respiratory: Negative for cough, chest tightness and shortness of breath.   Cardiovascular: Negative for chest pain, palpitations and leg swelling.  Gastrointestinal: Negative for abdominal pain, diarrhea, nausea and vomiting.  Genitourinary: Negative for difficulty urinating and dysuria.  Musculoskeletal: Negative for back pain and joint swelling.  Skin: Negative for color change and rash.  Neurological: Negative for dizziness, light-headedness and headaches.  Psychiatric/Behavioral: Negative for agitation and dysphoric mood.       Objective:     Blood pressure rechecked by me:  118/80  Physical Exam  Constitutional: She appears well-developed and well-nourished. No distress.  HENT:  Nose: Nose normal.  Mouth/Throat:  Oropharynx is clear and moist.  Neck: Neck supple. No thyromegaly present.  Cardiovascular: Normal rate and regular rhythm.   Pulmonary/Chest: Breath sounds normal. No respiratory distress. She has no wheezes.  Abdominal: Soft. Bowel sounds are normal. There is no tenderness.  Musculoskeletal: She exhibits no edema or tenderness.  Lymphadenopathy:    She has no cervical adenopathy.  Skin: No rash noted. No erythema.  Psychiatric: She has a normal mood and affect. Her behavior is normal.    BP 118/80   Pulse 85   Temp 98.2 F (36.8 C) (Oral)   Ht 5' 8"  (1.727 m)   Wt (!) 402 lb 3.2 oz (182.4 kg)   SpO2 96%   BMI 61.15 kg/m  Wt Readings from Last 3 Encounters:  07/29/16 (!) 402 lb 3.2 oz  (182.4 kg)  03/26/16 (!) 402 lb 4 oz (182.5 kg)  11/27/15 (!) 409 lb 6 oz (185.7 kg)     Lab Results  Component Value Date   WBC 5.1 03/27/2016   HGB 12.7 07/26/2015   HCT 38.2 03/27/2016   PLT 132 (L) 03/27/2016   GLUCOSE 97 03/27/2016   CHOL 200 (H) 03/27/2016   TRIG 136 03/27/2016   HDL 61 03/27/2016   LDLDIRECT 140.7 07/13/2013   LDLCALC 112 (H) 03/27/2016   ALT 14 03/27/2016   AST 17 03/27/2016   NA 143 03/27/2016   K 3.9 03/27/2016   CL 102 03/27/2016   CREATININE 0.98 03/27/2016   BUN 18 03/27/2016   CO2 31 05/17/2014   TSH 1.080 03/27/2016       Assessment & Plan:   Problem List Items Addressed This Visit    Essential hypertension, benign    Blood pressure under good control.  Continue same medication regimen.  Follow pressures.  Follow metabolic panel.        Relevant Orders   TSH   Basic metabolic panel   Microalbumin / creatinine urine ratio   Hypercholesteremia    Low cholesterol diet and exercise.  Follow lipid panel.       Relevant Orders   Lipid panel   Hyperglycemia    Low carb diet and exercise.  Follow met b and a1c.       Relevant Orders   Hemoglobin A1c   Obesity, morbid, BMI 50 or higher ([facility])    She is exercising.  Watching her diet.  Follow.  Discussed diet and exercise.       Thrombocytopenia, unspecified ([place])    Recheck cbc.       Relevant Orders   CBC with Differential/Platelet    Other Visit Diagnoses   None.      Einar Pheasant, MD

## 2016-07-29 NOTE — Assessment & Plan Note (Signed)
Blood pressure under good control.  Continue same medication regimen.  Follow pressures.  Follow metabolic panel.   

## 2016-07-29 NOTE — Assessment & Plan Note (Signed)
She is exercising.  Watching her diet.  Follow.  Discussed diet and exercise.

## 2016-07-29 NOTE — Assessment & Plan Note (Signed)
Low carb diet and exercise.  Follow met b and a1c.  

## 2016-10-12 ENCOUNTER — Other Ambulatory Visit: Payer: Self-pay | Admitting: Internal Medicine

## 2016-10-12 DIAGNOSIS — Z1231 Encounter for screening mammogram for malignant neoplasm of breast: Secondary | ICD-10-CM

## 2016-10-16 ENCOUNTER — Ambulatory Visit
Admission: RE | Admit: 2016-10-16 | Discharge: 2016-10-16 | Disposition: A | Discharge status: Home / Self Care | Payer: Managed Care, Other (non HMO) | Source: Ambulatory Visit | Attending: Internal Medicine | Admitting: Internal Medicine

## 2016-10-16 ENCOUNTER — Encounter: Payer: Self-pay | Admitting: Radiology

## 2016-10-16 DIAGNOSIS — Z1231 Encounter for screening mammogram for malignant neoplasm of breast: Secondary | ICD-10-CM | POA: Insufficient documentation

## 2016-12-07 ENCOUNTER — Other Ambulatory Visit (HOSPITAL_COMMUNITY)
Admission: RE | Admit: 2016-12-07 | Discharge: 2016-12-07 | Disposition: A | Discharge status: Home / Self Care | Payer: Managed Care, Other (non HMO) | Source: Ambulatory Visit | Attending: Internal Medicine | Admitting: Internal Medicine

## 2016-12-07 ENCOUNTER — Ambulatory Visit (INDEPENDENT_AMBULATORY_CARE_PROVIDER_SITE_OTHER): Payer: Managed Care, Other (non HMO) | Admitting: Internal Medicine

## 2016-12-07 ENCOUNTER — Encounter: Payer: Self-pay | Admitting: Internal Medicine

## 2016-12-07 VITALS — BP 118/78 | HR 70 | Temp 98.0°F | Resp 16 | Ht 69.0 in | Wt 398.5 lb

## 2016-12-07 DIAGNOSIS — D696 Thrombocytopenia, unspecified: Secondary | ICD-10-CM | POA: Diagnosis not present

## 2016-12-07 DIAGNOSIS — Z01411 Encounter for gynecological examination (general) (routine) with abnormal findings: Secondary | ICD-10-CM | POA: Insufficient documentation

## 2016-12-07 DIAGNOSIS — I10 Essential (primary) hypertension: Secondary | ICD-10-CM

## 2016-12-07 DIAGNOSIS — R739 Hyperglycemia, unspecified: Secondary | ICD-10-CM

## 2016-12-07 DIAGNOSIS — Z86010 Personal history of colonic polyps: Secondary | ICD-10-CM | POA: Diagnosis not present

## 2016-12-07 DIAGNOSIS — Z1151 Encounter for screening for human papillomavirus (HPV): Secondary | ICD-10-CM | POA: Insufficient documentation

## 2016-12-07 DIAGNOSIS — Z124 Encounter for screening for malignant neoplasm of cervix: Secondary | ICD-10-CM | POA: Diagnosis not present

## 2016-12-07 DIAGNOSIS — Z0000 Encounter for general adult medical examination without abnormal findings: Secondary | ICD-10-CM

## 2016-12-07 DIAGNOSIS — E7800 Pure hypercholesterolemia, unspecified: Secondary | ICD-10-CM

## 2016-12-07 NOTE — Assessment & Plan Note (Addendum)
Physical today 12/07/16.  PAP 11/27/15 - benign reactive/repair with negative HPV.  PAP 12/07/16.  Mammogram 10/19/16 - Birads I.  Colonoscopy due 04/2019.

## 2016-12-07 NOTE — Progress Notes (Signed)
Pre visit review using our clinic review tool, if applicable. No additional management support is needed unless otherwise documented below in the visit note. 

## 2016-12-07 NOTE — Progress Notes (Signed)
Patient ID: Morgan Duncan, female   DOB: 1961/03/22, 56 y.o.   MRN: [id number]   Subjective:    Patient ID: Morgan Duncan, female    DOB: December 13, 1960, 56 y.o.   MRN: [id number]  HPI  Patient here for a physical exam.  She reports she is doing relatively well.  She has been exercising.  Did pull a muscle in her leg, but is better.  Working with a Clinical research associate and getting back into her regular exercise routine.  No chest pain.  No sob.  No acid reflux.  No abdominal pain or cramping.  Bowels stable.  Overall feels good.  Has quit smoking.    Past Medical History:  Diagnosis Date  . Asymptomatic cholelithiasis   . Hypertension   . Mammographic microcalcification 2015  . Thrombocytopenia ([place])    Recent platelet count 151 on 11/01/08  . Tobacco use    Past Surgical History:  Procedure Laterality Date  . BREAST BIOPSY Left 2012   NEG  . KNEE SURGERY Left 3/11   left knee    Family History  Problem Relation Age of Onset  . Hypertension Mother   . Diabetes Mother   . Hypothyroidism Mother   . Heart disease Father     Died of a Heart Attack   . Breast cancer Neg Hx    Social History   Social History  . Marital status: Single    Spouse name: N/A  . Number of children: N/A  . Years of education: N/A   Social History Main Topics  . Smoking status: Former Smoker    Packs/day: 0.25    Years: 12.00    Types: Cigarettes  . Smokeless tobacco: Never Used     Comment: Quit March 2016  . Alcohol use 0.0 oz/week     Comment: occasionally  . Drug use: No  . Sexual activity: Not Asked   Other Topics Concern  . None   Social History Narrative  . None    Outpatient Encounter Prescriptions as of 12/07/2016  Medication Sig  . ascorbic acid (VITAMIN C) 250 MG CHEW Chew 250 mg by mouth daily.  . cyanocobalamin 500 MCG tablet Take 500 mcg by mouth daily.  [name] KLOR-CON M10 10 MEQ tablet TAKE 1 TABLET (10 MEQ TOTAL) BY MOUTH DAILY.  . naproxen (NAPROSYN) 500 MG tablet Take 500 mg by  mouth 2 (two) times daily as needed.  . Omega-3 Fatty Acids (FISH OIL) 1000 MG CAPS Take 2 capsules by mouth daily.  . Pediatric Multiple Vitamins (CHEWABLE MULTIPLE VITAMINS PO) Take 1 tablet by mouth daily.  [name] triamterene-hydrochlorothiazide (MAXZIDE-25) 37.5-25 MG tablet TAKE 1 TABLET BY MOUTH DAILY.   No facility-administered encounter medications on file as of 12/07/2016.     Review of Systems  Constitutional: Negative for appetite change and unexpected weight change.  HENT: Negative for congestion and sinus pressure.   Eyes: Negative for pain and visual disturbance.  Respiratory: Negative for cough, chest tightness and shortness of breath.   Cardiovascular: Negative for chest pain, palpitations and leg swelling.  Gastrointestinal: Negative for abdominal pain, diarrhea, nausea and vomiting.  Genitourinary: Negative for difficulty urinating and dysuria.  Musculoskeletal: Negative for back pain and joint swelling.       Previous pulled muscled (leg).  Better.   Skin: Negative for color change and rash.  Neurological: Negative for dizziness, light-headedness and headaches.  Hematological: Negative for adenopathy. Does not bruise/bleed easily.  Psychiatric/Behavioral: Negative for agitation and dysphoric mood.  Objective:     Blood pressure rechecked by me:  128/78  Physical Exam  Constitutional: She is oriented to person, place, and time. She appears well-developed and well-nourished. No distress.  HENT:  Nose: Nose normal.  Mouth/Throat: Oropharynx is clear and moist.  Eyes: Right eye exhibits no discharge. Left eye exhibits no discharge. No scleral icterus.  Neck: Neck supple. No thyromegaly present.  Cardiovascular: Normal rate and regular rhythm.   Pulmonary/Chest: Breath sounds normal. No accessory muscle usage. No tachypnea. No respiratory distress. She has no decreased breath sounds. She has no wheezes. She has no rhonchi. Right breast exhibits no inverted nipple, no  mass, no nipple discharge and no tenderness (no axillary adenopathy). Left breast exhibits no inverted nipple, no mass, no nipple discharge and no tenderness (no axilarry adenopathy).  Abdominal: Soft. Bowel sounds are normal. There is no tenderness.  Genitourinary:  Genitourinary Comments: Normal external genitalia.  Vaginal vault without lesions.  Cervix identified.  Pap smear performed.  Could not appreciate any adnexal masses or tenderness.    Musculoskeletal: She exhibits no edema or tenderness.  Lymphadenopathy:    She has no cervical adenopathy.  Neurological: She is alert and oriented to person, place, and time.  Skin: Skin is warm. No rash noted. No erythema.  Psychiatric: She has a normal mood and affect. Her behavior is normal.    BP 118/78 (BP Location: Left Arm, Patient Position: Sitting, Cuff Size: Large)   Pulse 70   Temp 98 F (36.7 C) (Oral)   Resp 16   Ht _0  (1.753 m)   Wt (!) 398 lb 8 oz (180.8 kg)   LMP 12/29/2012   BMI 58.85 kg/m  Wt Readings from Last 3 Encounters:  12/07/16 (!) 398 lb 8 oz (180.8 kg)  07/29/16 (!) 402 lb 3.2 oz (182.4 kg)  03/26/16 (!) 402 lb 4 oz (182.5 kg)     Lab Results  Component Value Date   WBC 5.1 03/27/2016   HGB 12.7 07/26/2015   HCT 38.2 03/27/2016   PLT 132 (L) 03/27/2016   GLUCOSE 97 03/27/2016   CHOL 200 (H) 03/27/2016   TRIG 136 03/27/2016   HDL 61 03/27/2016   LDLDIRECT 140.7 07/13/2013   LDLCALC 112 (H) 03/27/2016   ALT 14 03/27/2016   AST 17 03/27/2016   NA 143 03/27/2016   K 3.9 03/27/2016   CL 102 03/27/2016   CREATININE 0.98 03/27/2016   BUN 18 03/27/2016   CO2 31 05/17/2014   TSH 1.080 03/27/2016    Mm Digital Screening Bilateral  Result Date: 10/19/2016 CLINICAL DATA:  Screening. EXAM: DIGITAL SCREENING BILATERAL MAMMOGRAM WITH CAD COMPARISON:  Previous exam(s). ACR Breast Density Category b: There are scattered areas of fibroglandular density. FINDINGS: There are no findings suspicious for  malignancy. Images were processed with CAD. IMPRESSION: No mammographic evidence of malignancy. A result letter of this screening mammogram will be mailed directly to the patient. RECOMMENDATION: Screening mammogram in one year. (Code:SM-B-01Y) BI-RADS CATEGORY  1: Negative. Electronically Signed   By: Pamelia Hoit M.D.   On: 10/19/2016 10:10       Assessment & Plan:   Problem List Items Addressed This Visit    Essential hypertension, benign    Blood pressure under good control.  Continue same medication regimen.  Follow pressures.  Follow metabolic panel.        Health care maintenance    Physical today 12/07/16.  PAP 11/27/15 - benign reactive/repair with negative HPV.  PAP  12/07/16.  Mammogram 10/19/16 - Birads I.  Colonoscopy due 04/2019.        History of colonic polyps    Colonoscopy 2010.  Recommended f/u colonoscopy 2020.       Hypercholesteremia    Low cholesterol diet and exercise.  Follow lipid panel.       Hyperglycemia    Low carb diet and exercise.  Follow met b and a1c.  She is exercising.  Trying to adjust her diet.  Follow.        Obesity, morbid, BMI 50 or higher ([place])    Discussed diet and exercise.  Follow.       Thrombocytopenia (H[facility])    Last platelet count wnl.  Recheck cbc with next labs.        Other Visit Diagnoses    Pap smear for cervical cancer screening    -  Primary   Relevant Orders   Cytology - PAP       Einar Pheasant, MD

## 2016-12-08 ENCOUNTER — Encounter: Payer: Self-pay | Admitting: Internal Medicine

## 2016-12-08 NOTE — Assessment & Plan Note (Signed)
Discussed diet and exercise.  Follow.  

## 2016-12-08 NOTE — Assessment & Plan Note (Signed)
Last platelet count wnl.  Recheck cbc with next labs.   

## 2016-12-08 NOTE — Assessment & Plan Note (Signed)
Colonoscopy 2010.  Recommended f/u colonoscopy 2020.   

## 2016-12-08 NOTE — Assessment & Plan Note (Signed)
Low carb diet and exercise.  Follow met b and a1c.  She is exercising.  Trying to adjust her diet.  Follow.

## 2016-12-08 NOTE — Assessment & Plan Note (Signed)
Blood pressure under good control.  Continue same medication regimen.  Follow pressures.  Follow metabolic panel.   

## 2016-12-08 NOTE — Assessment & Plan Note (Signed)
Low cholesterol diet and exercise.  Follow lipid panel.   

## 2016-12-09 LAB — CYTOLOGY - PAP
DIAGNOSIS: NEGATIVE
HPV (WINDOPATH): NOT DETECTED

## 2016-12-10 ENCOUNTER — Encounter: Payer: Self-pay | Admitting: Internal Medicine

## 2017-02-07 ENCOUNTER — Other Ambulatory Visit: Payer: Self-pay | Admitting: Internal Medicine

## 2017-03-19 ENCOUNTER — Encounter: Payer: Self-pay | Admitting: Internal Medicine

## 2017-04-07 ENCOUNTER — Ambulatory Visit: Payer: Self-pay | Admitting: Internal Medicine

## 2017-06-14 ENCOUNTER — Telehealth: Payer: Self-pay | Admitting: Internal Medicine

## 2017-06-14 NOTE — Telephone Encounter (Signed)
Pt has an appt with Dr. Lorin PicketScott on 08/09/17. Does she need labs and if so orders need to be places so she can go to [facility]. Please advise.

## 2017-06-14 NOTE — Telephone Encounter (Signed)
Please advise 

## 2017-06-15 NOTE — Telephone Encounter (Signed)
Order faxed per patients request app moved to 8/21

## 2017-06-15 NOTE — Telephone Encounter (Signed)
Labs written on rx and placed in box.

## 2017-07-06 ENCOUNTER — Ambulatory Visit: Payer: Self-pay | Admitting: Internal Medicine

## 2017-07-09 ENCOUNTER — Telehealth: Payer: Self-pay | Admitting: Internal Medicine

## 2017-07-09 ENCOUNTER — Other Ambulatory Visit: Payer: Self-pay

## 2017-07-09 DIAGNOSIS — Z299 Encounter for prophylactic measures, unspecified: Secondary | ICD-10-CM

## 2017-07-09 NOTE — Progress Notes (Signed)
Patient came in to have blood drawn for testing per Dr. Charlene Scott's orders. 

## 2017-07-09 NOTE — Telephone Encounter (Signed)
Called patient have re faxed orders to [phone]

## 2017-07-09 NOTE — Telephone Encounter (Signed)
Pt called and is requesting to have lab orders sent to [facility] so that she can have them drawn today. Please advise, thank you!  Call pt @ [phone]

## 2017-07-10 LAB — CMP12+LP+TP+TSH+6AC+CBC/D/PLT
A/G RATIO: 1.6 (ref 1.2–2.2)
ALK PHOS: 87 IU/L (ref 39–117)
ALT: 20 IU/L (ref 0–32)
AST: 18 IU/L (ref 0–40)
Albumin: 4.5 g/dL (ref 3.5–5.5)
BASOS ABS: 0 10*3/uL (ref 0.0–0.2)
BILIRUBIN TOTAL: 0.3 mg/dL (ref 0.0–1.2)
BUN / CREAT RATIO: 16 (ref 9–23)
BUN: 17 mg/dL (ref 6–24)
Basos: 0 %
CHLORIDE: 104 mmol/L (ref 96–106)
Calcium: 9.2 mg/dL (ref 8.7–10.2)
Chol/HDL Ratio: 3.4 ratio (ref 0.0–4.4)
Cholesterol, Total: 228 mg/dL — ABNORMAL HIGH (ref 100–199)
Creatinine, Ser: 1.05 mg/dL — ABNORMAL HIGH (ref 0.57–1.00)
EOS (ABSOLUTE): 0.1 10*3/uL (ref 0.0–0.4)
EOS: 2 %
Estimated CHD Risk: <0.5 times avg. (ref 0.0–1.0)
FREE THYROXINE INDEX: 2 (ref 1.2–4.9)
GFR calc non Af Amer: 60 mL/min/{1.73_m2} (ref >59)
GFR, EST AFRICAN AMERICAN: 69 mL/min/{1.73_m2} (ref >59)
GGT: 26 IU/L (ref 0–60)
GLUCOSE: 94 mg/dL (ref 65–99)
Globulin, Total: 2.9 g/dL (ref 1.5–4.5)
HDL: 68 mg/dL (ref >39)
HEMATOCRIT: 38.5 % (ref 34.0–46.6)
HEMOGLOBIN: 12.5 g/dL (ref 11.1–15.9)
IMMATURE GRANS (ABS): 0 10*3/uL (ref 0.0–0.1)
IMMATURE GRANULOCYTES: 0 %
Iron: 66 ug/dL (ref 27–159)
LDH: 249 IU/L — ABNORMAL HIGH (ref 119–226)
LDL CALC: 142 mg/dL — AB (ref 0–99)
LYMPHS ABS: 2.2 10*3/uL (ref 0.7–3.1)
LYMPHS: 43 %
MCH: 28 pg (ref 26.6–33.0)
MCHC: 32.5 g/dL (ref 31.5–35.7)
MCV: 86 fL (ref 79–97)
MONOCYTES: 5 %
Monocytes Absolute: 0.3 10*3/uL (ref 0.1–0.9)
NEUTROS PCT: 50 %
Neutrophils Absolute: 2.6 10*3/uL (ref 1.4–7.0)
PLATELETS: 125 10*3/uL — AB (ref 150–379)
Phosphorus: 3.9 mg/dL (ref 2.5–4.5)
Potassium: 4.4 mmol/L (ref 3.5–5.2)
RBC: 4.46 x10E6/uL (ref 3.77–5.28)
RDW: 14.8 % (ref 12.3–15.4)
Sodium: 140 mmol/L (ref 134–144)
T3 UPTAKE RATIO: 30 % (ref 24–39)
T4, Total: 6.8 ug/dL (ref 4.5–12.0)
TOTAL PROTEIN: 7.4 g/dL (ref 6.0–8.5)
TSH: 1.1 u[IU]/mL (ref 0.450–4.500)
Triglycerides: 89 mg/dL (ref 0–149)
URIC ACID: 5.1 mg/dL (ref 2.5–7.1)
VLDL CHOLESTEROL CAL: 18 mg/dL (ref 5–40)
WBC: 5.2 10*3/uL (ref 3.4–10.8)

## 2017-07-10 LAB — HGB A1C W/O EAG: HEMOGLOBIN A1C: 6.1 % — AB (ref 4.8–5.6)

## 2017-07-10 LAB — MICROALBUMIN / CREATININE URINE RATIO
CREATININE, UR: 112.3 mg/dL
Microalb/Creat Ratio: <2.7 mg/g creat (ref 0.0–30.0)

## 2017-07-14 ENCOUNTER — Encounter: Payer: Self-pay | Admitting: Internal Medicine

## 2017-07-14 ENCOUNTER — Ambulatory Visit (INDEPENDENT_AMBULATORY_CARE_PROVIDER_SITE_OTHER): Payer: Managed Care, Other (non HMO)

## 2017-07-14 ENCOUNTER — Ambulatory Visit (INDEPENDENT_AMBULATORY_CARE_PROVIDER_SITE_OTHER): Payer: Managed Care, Other (non HMO) | Admitting: Internal Medicine

## 2017-07-14 VITALS — BP 110/78 | HR 71 | Temp 98.5°F | Resp 12 | Wt >= 6400 oz

## 2017-07-14 DIAGNOSIS — M25561 Pain in right knee: Secondary | ICD-10-CM

## 2017-07-14 DIAGNOSIS — D696 Thrombocytopenia, unspecified: Secondary | ICD-10-CM

## 2017-07-14 DIAGNOSIS — E7800 Pure hypercholesterolemia, unspecified: Secondary | ICD-10-CM | POA: Diagnosis not present

## 2017-07-14 DIAGNOSIS — R7402 Elevation of levels of lactic acid dehydrogenase (LDH): Secondary | ICD-10-CM

## 2017-07-14 DIAGNOSIS — R739 Hyperglycemia, unspecified: Secondary | ICD-10-CM | POA: Diagnosis not present

## 2017-07-14 DIAGNOSIS — I10 Essential (primary) hypertension: Secondary | ICD-10-CM

## 2017-07-14 DIAGNOSIS — Z6841 Body Mass Index (BMI) 40.0 and over, adult: Secondary | ICD-10-CM | POA: Diagnosis not present

## 2017-07-14 DIAGNOSIS — R740 Nonspecific elevation of levels of transaminase and lactic acid dehydrogenase [LDH]: Secondary | ICD-10-CM

## 2017-07-14 NOTE — Progress Notes (Signed)
Patient ID: Morgan Duncan, female   DOB: [birth year], 56 y.o.   MRN: [id number]   Subjective:    Patient ID: Morgan Duncan, female    DOB: 10-02-1961, 57 y.o.   MRN: [id number]  HPI  Patient here for a scheduled follow up.  She reports she is doing relatively well.  Discussed diet and exercise.  She has had some right knee discomfort/pain.  Feels limits her activity some.  Is able to walk ok.  Taking advil.  Gets in the sauna.  Helps some.  Persistent pain.  Desires further evaluation.  No chest pain.  No sob.  No acid reflux.  No abdominal pain.  Bowels moving.  Discussed lab results.  Discussed cholesterol and calculated risk. Has quit smoking.    Past Medical History:  Diagnosis Date  . Asymptomatic cholelithiasis   . Hypertension   . Mammographic microcalcification 2015  . Thrombocytopenia ([place])    Recent platelet count 151 on 11/01/08  . Tobacco use    Past Surgical History:  Procedure Laterality Date  . BREAST BIOPSY Left 2012   NEG  . KNEE SURGERY Left 3/11   left knee    Family History  Problem Relation Age of Onset  . Hypertension Mother   . Diabetes Mother   . Hypothyroidism Mother   . Heart disease Father        Died of a Heart Attack   . Breast cancer Neg Hx    Social History   Social History  . Marital status: Single    Spouse name: N/A  . Number of children: N/A  . Years of education: N/A   Social History Main Topics  . Smoking status: Former Smoker    Packs/day: 0.25    Years: 12.00    Types: Cigarettes  . Smokeless tobacco: Never Used     Comment: Quit March 2016  . Alcohol use 0.0 oz/week     Comment: occasionally  . Drug use: No  . Sexual activity: Not Asked   Other Topics Concern  . None   Social History Narrative  . None    Outpatient Encounter Prescriptions as of 07/14/2017  Medication Sig  . ascorbic acid (VITAMIN C) 250 MG CHEW Chew 250 mg by mouth daily.  . Black Currant Seed Oil 500 MG CAPS Take 1 capsule by mouth daily.  .  cyanocobalamin 500 MCG tablet Take 500 mcg by mouth daily.  [name] KLOR-CON M10 10 MEQ tablet TAKE 1 TABLET (10 MEQ TOTAL) BY MOUTH DAILY.  [name] Omega-3 Fatty Acids (FISH OIL) 1000 MG CAPS Take 2 capsules by mouth daily.  . Pediatric Multiple Vitamins (CHEWABLE MULTIPLE VITAMINS PO) Take 1 tablet by mouth daily.  [name] triamterene-hydrochlorothiazide (MAXZIDE-25) 37.5-25 MG tablet TAKE 1 TABLET BY MOUTH DAILY.  . [DISCONTINUED] naproxen (NAPROSYN) 500 MG tablet Take 500 mg by mouth 2 (two) times daily as needed.   No facility-administered encounter medications on file as of 07/14/2017.     Review of Systems  Constitutional: Negative for appetite change and unexpected weight change.  HENT: Negative for congestion and sinus pressure.   Respiratory: Negative for cough, chest tightness and shortness of breath.   Cardiovascular: Negative for chest pain, palpitations and leg swelling.  Gastrointestinal: Negative for abdominal pain, diarrhea, nausea and vomiting.  Genitourinary: Negative for difficulty urinating and dysuria.  Musculoskeletal: Negative for myalgias.       Right knee pain as outlined.    Skin: Negative for color change and rash.  Neurological: Negative for dizziness, light-headedness and headaches.  Psychiatric/Behavioral: Negative for agitation and dysphoric mood.       Objective:     Blood pressure rechecked by me:  122/72  Physical Exam  Constitutional: She appears well-developed and well-nourished. No distress.  HENT:  Nose: Nose normal.  Mouth/Throat: Oropharynx is clear and moist.  Neck: Neck supple. No thyromegaly present.  Cardiovascular: Normal rate and regular rhythm.   Pulmonary/Chest: Breath sounds normal. No respiratory distress. She has no wheezes.  Abdominal: Soft. Bowel sounds are normal. There is no tenderness.  Musculoskeletal: She exhibits no edema or tenderness.  Increased pain - right knee.  Some increased pain to palpation. No instability of the knee joint.  No  significant pain with resistance to flexion and extension.    Lymphadenopathy:    She has no cervical adenopathy.  Skin: No rash noted. No erythema.  Psychiatric: She has a normal mood and affect. Her behavior is normal.    BP 110/78 (BP Location: Left Arm, Patient Position: Sitting, Cuff Size: Large)   Pulse 71   Temp 98.5 F (36.9 C) (Oral)   Resp 12   Wt (!) 403 lb 8 oz (183 kg)   LMP 12/29/2012   SpO2 97%   BMI 59.59 kg/m  Wt Readings from Last 3 Encounters:  07/14/17 (!) 403 lb 8 oz (183 kg)  12/07/16 (!) 398 lb 8 oz (180.8 kg)  07/29/16 (!) 402 lb 3.2 oz (182.4 kg)     Lab Results  Component Value Date   WBC 5.2 07/09/2017   HGB 12.5 07/09/2017   HCT 38.5 07/09/2017   PLT 125 (L) 07/09/2017   GLUCOSE 94 07/09/2017   CHOL 228 (H) 07/09/2017   TRIG 89 07/09/2017   HDL 68 07/09/2017   LDLDIRECT 140.7 07/13/2013   LDLCALC 142 (H) 07/09/2017   ALT 20 07/09/2017   AST 18 07/09/2017   NA 140 07/09/2017   K 4.4 07/09/2017   CL 104 07/09/2017   CREATININE 1.05 (H) 07/09/2017   BUN 17 07/09/2017   CO2 31 05/17/2014   TSH 1.100 07/09/2017   HGBA1C 6.1 (H) 07/09/2017       Assessment & Plan:   Problem List Items Addressed This Visit    BMI 50.0-59.9, adult ([place])    Discussed diet and exercise.  She is exercising some.  Discussed diet adjustment.  Follow.        Essential hypertension, benign    Blood pressure under good control.  Continue same medication regimen.  Follow pressures.  Follow metabolic panel.        Hypercholesteremia    Discussed cholesterol results.  Low cholesterol diet and exercise.  Discussed calculated risk.  Follow lipid panel.        Hyperglycemia    Low carb diet and exercise.  Follow met b and a1c.       Right knee pain    Persistent right knee pain.  Check xray.  Further w/up, treatment and evaluation pending results.        Relevant Orders   DG Knee 1-2 Views Right (Completed)   Thrombocytopenia ([place]) - Primary    Platelet  count slightly decreased.  Previous ultrasound - spleen normal.  Recheck platelet count with next labs.         Other Visit Diagnoses    Elevated LDH       found on labs drawn at work.  recheck LDH.        Orma Cheetham,  Randell Patient, MD

## 2017-07-16 ENCOUNTER — Other Ambulatory Visit: Payer: Self-pay | Admitting: Internal Medicine

## 2017-07-16 DIAGNOSIS — M25561 Pain in right knee: Secondary | ICD-10-CM

## 2017-07-16 NOTE — Progress Notes (Signed)
Order placed for referral to physical therapy.  

## 2017-07-17 ENCOUNTER — Encounter: Payer: Self-pay | Admitting: Internal Medicine

## 2017-07-17 DIAGNOSIS — M25561 Pain in right knee: Secondary | ICD-10-CM | POA: Insufficient documentation

## 2017-07-17 NOTE — Assessment & Plan Note (Signed)
Discussed diet and exercise.  She is exercising some.  Discussed diet adjustment.  Follow.

## 2017-07-17 NOTE — Assessment & Plan Note (Signed)
Low carb diet and exercise.  Follow met b and a1c.  

## 2017-07-17 NOTE — Assessment & Plan Note (Signed)
Discussed cholesterol results.  Low cholesterol diet and exercise.  Discussed calculated risk.  Follow lipid panel.

## 2017-07-17 NOTE — Assessment & Plan Note (Signed)
Platelet count slightly decreased.  Previous ultrasound - spleen normal.  Recheck platelet count with next labs.

## 2017-07-17 NOTE — Assessment & Plan Note (Signed)
Blood pressure under good control.  Continue same medication regimen.  Follow pressures.  Follow metabolic panel.   

## 2017-07-17 NOTE — Assessment & Plan Note (Signed)
Persistent right knee pain.  Check xray.  Further w/up, treatment and evaluation pending results.

## 2017-08-09 ENCOUNTER — Ambulatory Visit: Payer: Self-pay | Admitting: Internal Medicine

## 2017-10-04 ENCOUNTER — Other Ambulatory Visit: Payer: Self-pay | Admitting: Internal Medicine

## 2017-10-04 DIAGNOSIS — Z1231 Encounter for screening mammogram for malignant neoplasm of breast: Secondary | ICD-10-CM

## 2017-10-11 ENCOUNTER — Other Ambulatory Visit: Payer: Self-pay

## 2017-10-11 DIAGNOSIS — Z299 Encounter for prophylactic measures, unspecified: Secondary | ICD-10-CM

## 2017-10-11 LAB — CBC AND DIFFERENTIAL
HEMATOCRIT: 37 (ref 36–46)
Hemoglobin: 12.2 (ref 12.0–16.0)
PLATELETS: 135 — AB (ref 150–399)
WBC: 4.9

## 2017-10-11 LAB — BASIC METABOLIC PANEL
BUN: 18 (ref 4–21)
Creatinine: 1 (ref 0.5–1.1)
GLUCOSE: 110
Potassium: 4.3 (ref 3.4–5.3)
Sodium: 143 (ref 137–147)

## 2017-10-11 NOTE — Progress Notes (Signed)
Patient came in to have blood drawn for testing per Dr. Charlene Scott's orders. 

## 2017-10-12 ENCOUNTER — Encounter: Payer: Self-pay | Admitting: Internal Medicine

## 2017-10-12 LAB — CBC WITH DIFFERENTIAL/PLATELET
BASOS: 0 %
Basophils Absolute: 0 10*3/uL (ref 0.0–0.2)
EOS (ABSOLUTE): 0.2 10*3/uL (ref 0.0–0.4)
EOS: 3 %
HEMATOCRIT: 36.8 % (ref 34.0–46.6)
Hemoglobin: 12.2 g/dL (ref 11.1–15.9)
IMMATURE GRANS (ABS): 0 10*3/uL (ref 0.0–0.1)
IMMATURE GRANULOCYTES: 0 %
Lymphocytes Absolute: 2 10*3/uL (ref 0.7–3.1)
Lymphs: 41 %
MCH: 27.9 pg (ref 26.6–33.0)
MCHC: 33.2 g/dL (ref 31.5–35.7)
MCV: 84 fL (ref 79–97)
MONOCYTES: 6 %
Monocytes Absolute: 0.3 10*3/uL (ref 0.1–0.9)
Neutrophils Absolute: 2.4 10*3/uL (ref 1.4–7.0)
Neutrophils: 50 %
Platelets: 135 10*3/uL — ABNORMAL LOW (ref 150–379)
RBC: 4.38 x10E6/uL (ref 3.77–5.28)
RDW: 14.8 % (ref 12.3–15.4)
WBC: 4.9 10*3/uL (ref 3.4–10.8)

## 2017-10-12 LAB — BASIC METABOLIC PANEL
BUN / CREAT RATIO: 18 (ref 9–23)
BUN: 18 mg/dL (ref 6–24)
CHLORIDE: 105 mmol/L (ref 96–106)
CO2: 23 mmol/L (ref 20–29)
Calcium: 9 mg/dL (ref 8.7–10.2)
Creatinine, Ser: 1.01 mg/dL — ABNORMAL HIGH (ref 0.57–1.00)
GFR calc Af Amer: 72 mL/min/{1.73_m2} (ref >59)
GFR calc non Af Amer: 63 mL/min/{1.73_m2} (ref >59)
GLUCOSE: 110 mg/dL — AB (ref 65–99)
POTASSIUM: 4.3 mmol/L (ref 3.5–5.2)
SODIUM: 143 mmol/L (ref 134–144)

## 2017-10-12 LAB — LACTATE DEHYDROGENASE: LDH: 228 IU/L — ABNORMAL HIGH (ref 119–226)

## 2017-10-13 ENCOUNTER — Encounter: Payer: Self-pay | Admitting: Internal Medicine

## 2017-10-14 ENCOUNTER — Ambulatory Visit: Payer: Managed Care, Other (non HMO) | Admitting: Internal Medicine

## 2017-10-14 ENCOUNTER — Encounter: Payer: Self-pay | Admitting: Internal Medicine

## 2017-10-14 DIAGNOSIS — Z720 Tobacco use: Secondary | ICD-10-CM | POA: Diagnosis not present

## 2017-10-14 DIAGNOSIS — R739 Hyperglycemia, unspecified: Secondary | ICD-10-CM | POA: Diagnosis not present

## 2017-10-14 DIAGNOSIS — D696 Thrombocytopenia, unspecified: Secondary | ICD-10-CM

## 2017-10-14 DIAGNOSIS — I10 Essential (primary) hypertension: Secondary | ICD-10-CM | POA: Diagnosis not present

## 2017-10-14 DIAGNOSIS — E7800 Pure hypercholesterolemia, unspecified: Secondary | ICD-10-CM | POA: Diagnosis not present

## 2017-10-14 DIAGNOSIS — Z6841 Body Mass Index (BMI) 40.0 and over, adult: Secondary | ICD-10-CM

## 2017-10-14 DIAGNOSIS — R002 Palpitations: Secondary | ICD-10-CM | POA: Diagnosis not present

## 2017-10-14 NOTE — Progress Notes (Signed)
Pre visit review using our clinic review tool, if applicable. No additional management support is needed unless otherwise documented below in the visit note. 

## 2017-10-14 NOTE — Progress Notes (Signed)
Patient ID: Morgan Duncan, female   DOB: May 11, 1961, 56 y.o.   MRN: [id number]   Subjective:    Patient ID: Morgan Duncan, female    DOB: 08-Oct-1961, 56 y.o.   MRN: [id number]  HPI  Patient here for a scheduled follow up.  She has been under increased stress recently.  Her brother was diagnosed with lung cancer.  Has good support.  She overall feel she is handling things relatively well.  Does have a lot of questions about her smoking history.  Has quit smoking now.  No chest pain.  No sob.  No increased cough or congestion.  No acid reflux.  No abdominal pain.  Bowels moving.     Past Medical History:  Diagnosis Date  . Asymptomatic cholelithiasis   . Hypertension   . Mammographic microcalcification 2015  . Thrombocytopenia ([place])    Recent platelet count 151 on 11/01/08  . Tobacco use    Past Surgical History:  Procedure Laterality Date  . BREAST BIOPSY Left 2012   NEG  . KNEE SURGERY Left 3/11   left knee    Family History  Problem Relation Age of Onset  . Hypertension Mother   . Diabetes Mother   . Hypothyroidism Mother   . Heart disease Father        Died of a Heart Attack   . Breast cancer Neg Hx    Social History   Socioeconomic History  . Marital status: Single    Spouse name: None  . Number of children: None  . Years of education: None  . Highest education level: None  Social Needs  . Financial resource strain: None  . Food insecurity - worry: None  . Food insecurity - inability: None  . Transportation needs - medical: None  . Transportation needs - non-medical: None  Occupational History  . None  Tobacco Use  . Smoking status: Former Smoker    Packs/day: 0.25    Years: 12.00    Pack years: 3.00    Types: Cigarettes  . Smokeless tobacco: Never Used  . Tobacco comment: Quit March 2016  Substance and Sexual Activity  . Alcohol use: Yes    Alcohol/week: 0.0 oz    Comment: occasionally  . Drug use: No  . Sexual activity: None  Other Topics  Concern  . None  Social History Narrative  . None    Outpatient Encounter Medications as of 10/14/2017  Medication Sig  . ascorbic acid (VITAMIN C) 250 MG CHEW Chew 250 mg by mouth daily.  . Black Currant Seed Oil 500 MG CAPS Take 1 capsule by mouth daily.  . cyanocobalamin 500 MCG tablet Take 500 mcg by mouth daily.  [name] KLOR-CON M10 10 MEQ tablet TAKE 1 TABLET (10 MEQ TOTAL) BY MOUTH DAILY.  [name] Omega-3 Fatty Acids (FISH OIL) 1000 MG CAPS Take 2 capsules by mouth daily.  . Pediatric Multiple Vitamins (CHEWABLE MULTIPLE VITAMINS PO) Take 1 tablet by mouth daily.  [name] triamterene-hydrochlorothiazide (MAXZIDE-25) 37.5-25 MG tablet TAKE 1 TABLET BY MOUTH DAILY.   No facility-administered encounter medications on file as of 10/14/2017.     Review of Systems  Constitutional: Negative for appetite change and unexpected weight change.  HENT: Negative for congestion and sinus pressure.   Respiratory: Negative for cough, chest tightness and shortness of breath.   Cardiovascular: Negative for chest pain, palpitations and leg swelling.  Gastrointestinal: Negative for abdominal pain, diarrhea and nausea.  Genitourinary: Negative for difficulty urinating and dysuria.  Musculoskeletal: Negative for joint swelling and myalgias.  Skin: Negative for color change and rash.  Neurological: Negative for dizziness, light-headedness and headaches.  Psychiatric/Behavioral: Negative for agitation and dysphoric mood.       Increased stress as outlined.         Objective:     Blood pressure rechecked by me:  118/72  Physical Exam  Constitutional: She appears well-developed and well-nourished. No distress.  HENT:  Nose: Nose normal.  Mouth/Throat: Oropharynx is clear and moist.  Neck: Neck supple. No thyromegaly present.  Cardiovascular: Normal rate and regular rhythm.  Pulmonary/Chest: Breath sounds normal. No respiratory distress. She has no wheezes.  Abdominal: Soft. Bowel sounds are normal. There is  no tenderness.  Musculoskeletal: She exhibits no edema or tenderness.  Lymphadenopathy:    She has no cervical adenopathy.  Skin: No rash noted. No erythema.  Psychiatric: She has a normal mood and affect. Her behavior is normal.    BP 138/89   Pulse 75   Temp 98.3 F (36.8 C) (Oral)   Ht 5' 9"  (1.753 m)   Wt (!) 410 lb 14.4 oz (186.4 kg)   LMP 12/29/2012   SpO2 97%   BMI 60.68 kg/m  Wt Readings from Last 3 Encounters:  10/14/17 (!) 410 lb 14.4 oz (186.4 kg)  07/14/17 (!) 403 lb 8 oz (183 kg)  12/07/16 (!) 398 lb 8 oz (180.8 kg)     Lab Results  Component Value Date   WBC 4.9 10/11/2017   HGB 12.2 10/11/2017   HCT 36.8 10/11/2017   PLT 135 (L) 10/11/2017   GLUCOSE 110 (H) 10/11/2017   CHOL 228 (H) 07/09/2017   TRIG 89 07/09/2017   HDL 68 07/09/2017   LDLDIRECT 140.7 07/13/2013   LDLCALC 142 (H) 07/09/2017   ALT 20 07/09/2017   AST 18 07/09/2017   NA 143 10/11/2017   K 4.3 10/11/2017   CL 105 10/11/2017   CREATININE 1.01 (H) 10/11/2017   BUN 18 10/11/2017   CO2 23 10/11/2017   TSH 1.100 07/09/2017   HGBA1C 6.1 (H) 07/09/2017       Assessment & Plan:   Problem List Items Addressed This Visit    BMI 60.0-69.9, adult ([place])    Discussed diet and exercise.  Follow.        Essential hypertension, benign    Blood pressure under good control.  Continue same medication regimen.  Follow pressures.  Follow metabolic panel.        Hypercholesteremia    Low cholesterol diet and exercise.  Follow lipid panel.        Hyperglycemia    Low carb diet and exercise.  Follow met b and a1c.        Palpitations    Better.  Doing well.  Follow.        Thrombocytopenia (HCC)    Platelet count slightly decreased.  Previous ultrasound - spleen normal.  Follow platelet count.        Tobacco abuse    She has quit smoking.  She had questions, given her brother's diagnosis of lung cancer.  Discussed her concerns.  Discussed CT screening.  She is interested in further  testing.  Will forward information to find out her cost.            Einar Pheasant, MD

## 2017-10-17 ENCOUNTER — Encounter: Payer: Self-pay | Admitting: Internal Medicine

## 2017-10-17 NOTE — Assessment & Plan Note (Signed)
Low carb diet and exercise.  Follow met b and a1c.   

## 2017-10-17 NOTE — Assessment & Plan Note (Signed)
Discussed diet and exercise.  Follow.  

## 2017-10-17 NOTE — Assessment & Plan Note (Addendum)
She has quit smoking.  She had questions, given her brother's diagnosis of lung cancer.  Discussed her concerns.  Discussed CT screening.  She is interested in further testing.  Will forward information to find out her cost.

## 2017-10-17 NOTE — Assessment & Plan Note (Signed)
Platelet count slightly decreased.  Previous ultrasound - spleen normal.  Follow platelet count.

## 2017-10-17 NOTE — Assessment & Plan Note (Signed)
Blood pressure under good control.  Continue same medication regimen.  Follow pressures.  Follow metabolic panel.   

## 2017-10-17 NOTE — Assessment & Plan Note (Signed)
Low cholesterol diet and exercise.  Follow lipid panel.   

## 2017-10-17 NOTE — Assessment & Plan Note (Signed)
Better.  Doing well.  Follow.    

## 2017-10-18 ENCOUNTER — Telehealth: Payer: Self-pay | Admitting: Internal Medicine

## 2017-10-18 ENCOUNTER — Telehealth: Payer: Self-pay

## 2017-10-18 NOTE — Telephone Encounter (Signed)
I'm not sure who called her, can you call and see who did?

## 2017-10-18 NOTE — Telephone Encounter (Signed)
Copied from CRM [id number]. Topic: General - Other >> Oct 18, 2017  1:09 PM Darletta MollLander, Lumin L wrote: Reason for CRM: Patient missed call 3  times, not sure from whom. Just 10 mins ago.

## 2017-10-18 NOTE — Telephone Encounter (Signed)
Called pt and notified her of information regarding the screening CT scan.  Discussed getting a cxr.  She will call her insurance company and let me know if she wants me to order cxr.  She wants to hold at this time.

## 2017-10-18 NOTE — Telephone Encounter (Signed)
-----   Message from Jonne PlyShawn P Perkins, RN sent at 10/18/2017  9:34 AM EST ----- Regarding: RE: ct screening Dr. Lorin PicketScott, I am happy to talk with Ms. Rinaldo RatelGarrison and you are welcome to share my phone number with her. [phone]. I don't think any insurance would cover based on based a smoking history that is not 30 pack years. The out of pocket may be in the neighborhood of 750 - 1000 according to Micael HampshireStephanie Mogg at the outpatient imaging center. Her recommendation is to do a chest xray for the patient's peace of mind and then if there were to be anything suspicious, a CT of chest would be covered by insurance.   I hope this helps, Shawn ----- Message ----- From: Dale DurhamScott, Shalece Staffa, MD Sent: 10/17/2017   9:43 AM To: Jonne PlyShawn P Perkins, RN Subject: ct screening                                   Ms Rinaldo RatelGarrison is a pt that I follow regularly.  Her brother was just recently diagnosed with lung cancer.  She had lots of questions about this and about a screening CT.  She does not meet criteria.  She does not meet the criteria for "pack years smoked".  She is still interested in getting the scan.  Are you aware of insurance coverage just with her smoking history or do you know how much this would cost her out of pocket.   If you do not know, do you know anyone she could talk to.  Thanks for your time.    Dale Durhamharlene Oneida Mckamey

## 2017-10-18 NOTE — Telephone Encounter (Signed)
csalled and talked to pt

## 2017-10-26 ENCOUNTER — Ambulatory Visit
Admission: RE | Admit: 2017-10-26 | Discharge: 2017-10-26 | Disposition: A | Discharge status: Home / Self Care | Payer: Managed Care, Other (non HMO) | Source: Ambulatory Visit | Attending: Internal Medicine | Admitting: Internal Medicine

## 2017-10-26 DIAGNOSIS — Z1231 Encounter for screening mammogram for malignant neoplasm of breast: Secondary | ICD-10-CM | POA: Diagnosis not present

## 2018-01-14 ENCOUNTER — Other Ambulatory Visit: Payer: Self-pay

## 2018-01-14 ENCOUNTER — Ambulatory Visit (INDEPENDENT_AMBULATORY_CARE_PROVIDER_SITE_OTHER): Payer: Managed Care, Other (non HMO) | Admitting: Internal Medicine

## 2018-01-14 ENCOUNTER — Encounter: Payer: Self-pay | Admitting: Internal Medicine

## 2018-01-14 VITALS — BP 132/78 | HR 72 | Temp 97.9°F | Resp 18 | Wt >= 6400 oz

## 2018-01-14 DIAGNOSIS — R739 Hyperglycemia, unspecified: Secondary | ICD-10-CM

## 2018-01-14 DIAGNOSIS — I10 Essential (primary) hypertension: Secondary | ICD-10-CM | POA: Diagnosis not present

## 2018-01-14 DIAGNOSIS — R05 Cough: Secondary | ICD-10-CM

## 2018-01-14 DIAGNOSIS — D696 Thrombocytopenia, unspecified: Secondary | ICD-10-CM

## 2018-01-14 DIAGNOSIS — E7800 Pure hypercholesterolemia, unspecified: Secondary | ICD-10-CM | POA: Diagnosis not present

## 2018-01-14 DIAGNOSIS — Z6841 Body Mass Index (BMI) 40.0 and over, adult: Secondary | ICD-10-CM

## 2018-01-14 DIAGNOSIS — Z0000 Encounter for general adult medical examination without abnormal findings: Secondary | ICD-10-CM | POA: Diagnosis not present

## 2018-01-14 DIAGNOSIS — R059 Cough, unspecified: Secondary | ICD-10-CM

## 2018-01-14 MED ORDER — TRIAMTERENE-HCTZ 37.5-25 MG PO TABS: 1.0000 | ORAL_TABLET | Freq: Every day | ORAL | 3 refills | Status: DC

## 2018-01-14 NOTE — Progress Notes (Signed)
Patient ID: Morgan Duncan, female   DOB: 23-Nov-1960, 57 y.o.   MRN: [id number]   Subjective:    Patient ID: Morgan Duncan, female    DOB: 1960/12/01, 57 y.o.   MRN: [id number]  HPI  Patient here for her physical exam.  She reports she is doing relatively well.  Has quit smoking.  Has gained weight.  Discussed diet and exercise.  Discussed the need for weight loss.  Plans to start exercising more. No chest pain.  No sob.  Reports runny nose and some post nasal drainage.  Some cough - occasionally productive.  No sob.  No chest pain.  No acid reflux.  No abdominal pain.  Bowels moving.  Still with increased stress.  Overall she feels she is handling things relatively well.     Past Medical History:  Diagnosis Date  . Asymptomatic cholelithiasis   . Hypertension   . Mammographic microcalcification 2015  . Thrombocytopenia ([place])    Recent platelet count 151 on 11/01/08  . Tobacco use    Past Surgical History:  Procedure Laterality Date  . BREAST BIOPSY Left 2012   NEG  . KNEE SURGERY Left 3/11   left knee    Family History  Problem Relation Age of Onset  . Hypertension Mother   . Diabetes Mother   . Hypothyroidism Mother   . Heart disease Father        Died of a Heart Attack   . Breast cancer Neg Hx    Social History   Socioeconomic History  . Marital status: Single    Spouse name: None  . Number of children: None  . Years of education: None  . Highest education level: None  Social Needs  . Financial resource strain: None  . Food insecurity - worry: None  . Food insecurity - inability: None  . Transportation needs - medical: None  . Transportation needs - non-medical: None  Occupational History  . None  Tobacco Use  . Smoking status: Former Smoker    Packs/day: 0.25    Years: 12.00    Pack years: 3.00    Types: Cigarettes  . Smokeless tobacco: Never Used  . Tobacco comment: Quit March 2016  Substance and Sexual Activity  . Alcohol use: Yes    Alcohol/week:  0.0 oz    Comment: occasionally  . Drug use: No  . Sexual activity: None  Other Topics Concern  . None  Social History Narrative  . None    Outpatient Encounter Medications as of 01/14/2018  Medication Sig  . ascorbic acid (VITAMIN C) 250 MG CHEW Chew 250 mg by mouth daily.  . Black Currant Seed Oil 500 MG CAPS Take 1 capsule by mouth daily.  . cyanocobalamin 500 MCG tablet Take 500 mcg by mouth daily.  [name] KLOR-CON M10 10 MEQ tablet TAKE 1 TABLET (10 MEQ TOTAL) BY MOUTH DAILY.  [name] Omega-3 Fatty Acids (FISH OIL) 1000 MG CAPS Take 2 capsules by mouth daily.  . Pediatric Multiple Vitamins (CHEWABLE MULTIPLE VITAMINS PO) Take 1 tablet by mouth daily.  [name] triamterene-hydrochlorothiazide (MAXZIDE-25) 37.5-25 MG tablet Take 1 tablet by mouth daily.  . [DISCONTINUED] potassium chloride (K-DUR) 10 MEQ tablet TAKE 1 TABLET EVERY DAY  . [DISCONTINUED] triamterene-hydrochlorothiazide (MAXZIDE-25) 37.5-25 MG tablet TAKE 1 TABLET BY MOUTH DAILY.   No facility-administered encounter medications on file as of 01/14/2018.     Review of Systems  Constitutional: Positive for fatigue. Negative for appetite change and unexpected weight change.  HENT: Positive for congestion. Negative for sinus pressure.   Eyes: Negative for pain and visual disturbance.  Respiratory: Positive for cough. Negative for chest tightness and shortness of breath.   Cardiovascular: Negative for chest pain, palpitations and leg swelling.  Gastrointestinal: Negative for abdominal pain, diarrhea and nausea.  Genitourinary: Negative for difficulty urinating and dysuria.  Musculoskeletal: Negative for joint swelling and myalgias.  Skin: Negative for color change and rash.  Neurological: Negative for dizziness, light-headedness and headaches.  Hematological: Negative for adenopathy. Does not bruise/bleed easily.  Psychiatric/Behavioral: Negative for agitation and dysphoric mood.       Objective:     Blood pressure rechecked by me:   132/78  Physical Exam  Constitutional: She is oriented to person, place, and time. She appears well-developed and well-nourished. No distress.  HENT:  Nose: Nose normal.  Mouth/Throat: Oropharynx is clear and moist.  Eyes: Right eye exhibits no discharge. Left eye exhibits no discharge. No scleral icterus.  Neck: Neck supple. No thyromegaly present.  Cardiovascular: Normal rate and regular rhythm.  Pulmonary/Chest: Breath sounds normal. No accessory muscle usage. No tachypnea. No respiratory distress. She has no decreased breath sounds. She has no wheezes. She has no rhonchi. Right breast exhibits no inverted nipple, no mass, no nipple discharge and no tenderness (no axillary adenopathy). Left breast exhibits no inverted nipple, no mass, no nipple discharge and no tenderness (no axilarry adenopathy).  Abdominal: Soft. Bowel sounds are normal. There is no tenderness.  Musculoskeletal: She exhibits no edema or tenderness.  Lymphadenopathy:    She has no cervical adenopathy.  Neurological: She is alert and oriented to person, place, and time.  Skin: Skin is warm. No rash noted. No erythema.  Psychiatric: She has a normal mood and affect. Her behavior is normal.    BP 132/78   Pulse 72   Temp 97.9 F (36.6 C) (Oral)   Resp 18   Wt (!) 417 lb 12.8 oz (189.5 kg)   LMP 12/29/2012   SpO2 98%   BMI 61.70 kg/m  Wt Readings from Last 3 Encounters:  01/14/18 (!) 417 lb 12.8 oz (189.5 kg)  10/14/17 (!) 410 lb 14.4 oz (186.4 kg)  07/14/17 (!) 403 lb 8 oz (183 kg)     Lab Results  Component Value Date   WBC 4.9 10/11/2017   HGB 12.2 10/11/2017   HCT 36.8 10/11/2017   PLT 135 (L) 10/11/2017   GLUCOSE 110 (H) 10/11/2017   CHOL 228 (H) 07/09/2017   TRIG 89 07/09/2017   HDL 68 07/09/2017   LDLDIRECT 140.7 07/13/2013   LDLCALC 142 (H) 07/09/2017   ALT 18 01/14/2018   AST 14 01/14/2018   NA 143 10/11/2017   K 4.3 10/11/2017   CL 105 10/11/2017   CREATININE 1.01 (H) 10/11/2017   BUN  18 10/11/2017   CO2 23 10/11/2017   TSH 1.100 07/09/2017   HGBA1C 6.3 (H) 01/14/2018    Mm Digital Screening Bilateral  Result Date: 10/27/2017 CLINICAL DATA:  Screening. EXAM: DIGITAL SCREENING BILATERAL MAMMOGRAM WITH CAD COMPARISON:  Previous exam(s). ACR Breast Density Category a: The breast tissue is almost entirely fatty. FINDINGS: There are no findings suspicious for malignancy. Images were processed with CAD. IMPRESSION: No mammographic evidence of malignancy. A result letter of this screening mammogram will be mailed directly to the patient. RECOMMENDATION: Screening mammogram in one year. (Code:SM-B-01Y) BI-RADS CATEGORY  1: Negative. Electronically Signed   By: Kristopher Oppenheim M.D.   On: 10/27/2017 09:52  Assessment & Plan:   Problem List Items Addressed This Visit    BMI 60.0-69.9, adult ([place])    Discussed diet and exercise.  Follow.       Essential hypertension, benign    Blood pressure on recheck improved.  Continue same medication regimen.  Follow pressures.  Follow metabolic panel.       Relevant Medications   triamterene-hydrochlorothiazide (MAXZIDE-25) 37.5-25 MG tablet   Other Relevant Orders   Basic Metabolic Panel (BMET)   Health care maintenance    Physical today 01/14/18.  PAP 12/07/16 - negative with negative HPV.  Mammogram 10/27/17 - Birads I.  Colonoscopy due 04/2019      Hypercholesteremia    Low cholesterol diet and exercise.  Follow lipid panel.       Relevant Medications   triamterene-hydrochlorothiazide (MAXZIDE-25) 37.5-25 MG tablet   Other Relevant Orders   Lipid panel   Hyperglycemia    Low carb diet and exercise.  Follow met b and a1c.       Relevant Orders   Hemoglobin A1c   Thrombocytopenia (H[facility])    Has been stable.  Follow cbc.         Other Visit Diagnoses    Routine general medical examination at a health care facility    -  Primary   Cough       nasal congestion and drainage associated with cough.  nasacort nasal spray as  directed.  robitussin as directed.  follow.  noitfy me if persistent.         Einar Pheasant, MD

## 2018-01-14 NOTE — Assessment & Plan Note (Signed)
Physical today 01/14/18.  PAP 12/07/16 - negative with negative HPV.  Mammogram 10/27/17 - Birads I.  Colonoscopy due 04/2019

## 2018-01-14 NOTE — Patient Instructions (Signed)
nasacort nasal spray - 2 sprays each nostril one time per day.  Do this in the evening.   Robitussin - twice a day as needed.   

## 2018-01-15 LAB — HEPATIC FUNCTION PANEL
ALK PHOS: 98 IU/L (ref 39–117)
ALT: 18 IU/L (ref 0–32)
AST: 14 IU/L (ref 0–40)
Albumin: 4.3 g/dL (ref 3.5–5.5)
BILIRUBIN, DIRECT: 0.1 mg/dL (ref 0.00–0.40)
Bilirubin Total: 0.3 mg/dL (ref 0.0–1.2)
Total Protein: 7.4 g/dL (ref 6.0–8.5)

## 2018-01-15 LAB — HGB A1C W/O EAG: HEMOGLOBIN A1C: 6.3 % — AB (ref 4.8–5.6)

## 2018-01-16 ENCOUNTER — Encounter: Payer: Self-pay | Admitting: Internal Medicine

## 2018-01-16 NOTE — Assessment & Plan Note (Signed)
Low carb diet and exercise.  Follow met b and a1c.  

## 2018-01-16 NOTE — Assessment & Plan Note (Signed)
Discussed diet and exercise.  Follow.  

## 2018-01-16 NOTE — Assessment & Plan Note (Signed)
Low cholesterol diet and exercise.  Follow lipid panel.   

## 2018-01-16 NOTE — Assessment & Plan Note (Signed)
Blood pressure on recheck improved.  Continue same medication regimen.  Follow pressures.  Follow metabolic panel.   

## 2018-01-16 NOTE — Assessment & Plan Note (Signed)
Has been stable. Follow cbc.  

## 2018-01-19 ENCOUNTER — Telehealth: Payer: Self-pay | Admitting: Internal Medicine

## 2018-01-19 NOTE — Telephone Encounter (Signed)
Patient called to get lab results. Charted in result note. She will check with the clinic where she had the labs drawn to see if the Lipid Panel and CMP were done. If not, she will check to see if the new orders can be faxed. She will call back with what was drawn and if it is ok to fax the lab orders. The fax number is: [phone].

## 2018-01-20 ENCOUNTER — Telehealth: Payer: Self-pay | Admitting: Internal Medicine

## 2018-01-20 NOTE — Telephone Encounter (Signed)
Pt. Called to request orders be faxed to [facility] for Cholesterol Panel and Metabolic panel.  Reported that these were labs that didn't get ordered previously. (see Dr. Roby LoftsScott's note in recent result note of 01/15/18)  The pt. has an appt. tomorrow at 8:00 AM; she is requesting to have these orders faxed for the 8:00 AM appt. please.   [facility]; Fax # [phone], and Phone # [phone]

## 2018-01-21 ENCOUNTER — Other Ambulatory Visit: Payer: Self-pay

## 2018-01-21 DIAGNOSIS — I10 Essential (primary) hypertension: Secondary | ICD-10-CM

## 2018-01-21 DIAGNOSIS — E7800 Pure hypercholesterolemia, unspecified: Secondary | ICD-10-CM

## 2018-01-21 NOTE — Addendum Note (Signed)
Addended by: Dollene PrimroseBURKE, Haisley Arens on: 01/21/2018 09:16 AM   Modules accepted: Level of Service

## 2018-01-21 NOTE — Telephone Encounter (Signed)
Faxed copy from chart also.

## 2018-01-21 NOTE — Telephone Encounter (Signed)
Labs are in Epic can be seen there

## 2018-01-22 LAB — BASIC METABOLIC PANEL
BUN/Creatinine Ratio: 16 (ref 9–23)
BUN: 15 mg/dL (ref 6–24)
CALCIUM: 8.9 mg/dL (ref 8.7–10.2)
CO2: 25 mmol/L (ref 20–29)
Chloride: 103 mmol/L (ref 96–106)
Creatinine, Ser: 0.95 mg/dL (ref 0.57–1.00)
GFR, EST AFRICAN AMERICAN: 78 mL/min/{1.73_m2} (ref >59)
GFR, EST NON AFRICAN AMERICAN: 68 mL/min/{1.73_m2} (ref >59)
Glucose: 112 mg/dL — ABNORMAL HIGH (ref 65–99)
Potassium: 4.5 mmol/L (ref 3.5–5.2)
Sodium: 142 mmol/L (ref 134–144)

## 2018-01-22 LAB — LIPID PANEL
CHOL/HDL RATIO: 3 ratio (ref 0.0–4.4)
Cholesterol, Total: 219 mg/dL — ABNORMAL HIGH (ref 100–199)
HDL: 73 mg/dL (ref >39)
LDL CALC: 129 mg/dL — AB (ref 0–99)
Triglycerides: 84 mg/dL (ref 0–149)
VLDL CHOLESTEROL CAL: 17 mg/dL (ref 5–40)

## 2018-01-24 ENCOUNTER — Encounter: Payer: Self-pay | Admitting: Internal Medicine

## 2018-02-22 ENCOUNTER — Encounter: Payer: Self-pay | Admitting: Internal Medicine

## 2018-02-27 ENCOUNTER — Other Ambulatory Visit: Payer: Self-pay | Admitting: Internal Medicine

## 2018-02-28 ENCOUNTER — Encounter: Payer: Self-pay | Admitting: Internal Medicine

## 2018-03-01 NOTE — Telephone Encounter (Signed)
Filled out height and waist circumference. Placed in your blue folder for signature.

## 2018-03-01 NOTE — Telephone Encounter (Signed)
Signed and placed in box.   

## 2018-03-01 NOTE — Telephone Encounter (Signed)
This is the information that you requested from the patient, per your last my chart message to her.  Do you have this form she is asking about?  If so, we need to get this completed and turned in.

## 2018-05-17 ENCOUNTER — Ambulatory Visit: Payer: Managed Care, Other (non HMO) | Admitting: Internal Medicine

## 2018-06-24 ENCOUNTER — Ambulatory Visit (INDEPENDENT_AMBULATORY_CARE_PROVIDER_SITE_OTHER): Payer: Managed Care, Other (non HMO) | Admitting: Internal Medicine

## 2018-06-24 ENCOUNTER — Encounter: Payer: Self-pay | Admitting: Internal Medicine

## 2018-06-24 VITALS — BP 130/78 | HR 73 | Temp 98.4°F | Resp 18 | Wt >= 6400 oz

## 2018-06-24 DIAGNOSIS — I10 Essential (primary) hypertension: Secondary | ICD-10-CM | POA: Diagnosis not present

## 2018-06-24 DIAGNOSIS — D696 Thrombocytopenia, unspecified: Secondary | ICD-10-CM

## 2018-06-24 DIAGNOSIS — M25511 Pain in right shoulder: Secondary | ICD-10-CM

## 2018-06-24 DIAGNOSIS — R739 Hyperglycemia, unspecified: Secondary | ICD-10-CM | POA: Diagnosis not present

## 2018-06-24 DIAGNOSIS — E7800 Pure hypercholesterolemia, unspecified: Secondary | ICD-10-CM

## 2018-06-24 NOTE — Progress Notes (Signed)
Patient ID: Morgan Duncan, female   DOB: July 17, 1961, 57 y.o.   MRN: [id number]   Subjective:    Patient ID: Morgan Duncan, female    DOB: Sep 16, 1961, 57 y.o.   MRN: [id number]  HPI  Patient here for a scheduled follow up.  She reports she is doing relatively well.  Handling stress.  Discussed diet and exercise.  Discussed the need for weight loss.  No chest pain.  No sob.  No acid reflux.  No abdominal pain.  Bowels moving.  No urine change.  Has seen ortho.  Therapy.  Doing better.  Shoulder better.     Past Medical History:  Diagnosis Date  . Asymptomatic cholelithiasis   . Hypertension   . Mammographic microcalcification 2015  . Thrombocytopenia ([place])    Recent platelet count 151 on 11/01/08  . Tobacco use    Past Surgical History:  Procedure Laterality Date  . BREAST BIOPSY Left 2012   NEG  . KNEE SURGERY Left 3/11   left knee    Family History  Problem Relation Age of Onset  . Hypertension Mother   . Diabetes Mother   . Hypothyroidism Mother   . Heart disease Father        Died of a Heart Attack   . Breast cancer Neg Hx    Social History   Socioeconomic History  . Marital status: Single    Spouse name: Not on file  . Number of children: Not on file  . Years of education: Not on file  . Highest education level: Not on file  Occupational History  . Not on file  Social Needs  . Financial resource strain: Not on file  . Food insecurity:    Worry: Not on file    Inability: Not on file  . Transportation needs:    Medical: Not on file    Non-medical: Not on file  Tobacco Use  . Smoking status: Former Smoker    Packs/day: 0.25    Years: 12.00    Pack years: 3.00    Types: Cigarettes  . Smokeless tobacco: Never Used  . Tobacco comment: Quit March 2016  Substance and Sexual Activity  . Alcohol use: Yes    Alcohol/week: 0.0 standard drinks    Comment: occasionally  . Drug use: No  . Sexual activity: Not on file  Lifestyle  . Physical activity:    Days  per week: Not on file    Minutes per session: Not on file  . Stress: Not on file  Relationships  . Social connections:    Talks on phone: Not on file    Gets together: Not on file    Attends religious service: Not on file    Active member of club or organization: Not on file    Attends meetings of clubs or organizations: Not on file    Relationship status: Not on file  Other Topics Concern  . Not on file  Social History Narrative  . Not on file    Outpatient Encounter Medications as of 06/24/2018  Medication Sig  . ascorbic acid (VITAMIN C) 250 MG CHEW Chew 250 mg by mouth daily.  . cyanocobalamin 500 MCG tablet Take 500 mcg by mouth daily.  [name] KLOR-CON M10 10 MEQ tablet TAKE 1 TABLET (10 MEQ TOTAL) BY MOUTH DAILY.  [name] Omega-3 Fatty Acids (FISH OIL) 1000 MG CAPS Take 2 capsules by mouth daily.  . Pediatric Multiple Vitamins (CHEWABLE MULTIPLE VITAMINS PO) Take 1 tablet  by mouth daily.  [name] triamterene-hydrochlorothiazide (MAXZIDE-25) 37.5-25 MG tablet Take 1 tablet by mouth daily.  . Black Currant Seed Oil 500 MG CAPS Take 1 capsule by mouth daily.  . [DISCONTINUED] potassium chloride (K-DUR) 10 MEQ tablet TAKE 1 TABLET EVERY DAY   No facility-administered encounter medications on file as of 06/24/2018.     Review of Systems  Constitutional: Negative for appetite change and unexpected weight change.  HENT: Negative for congestion and sinus pressure.   Respiratory: Negative for cough, chest tightness and shortness of breath.   Cardiovascular: Negative for chest pain, palpitations and leg swelling.  Gastrointestinal: Negative for abdominal pain, diarrhea, nausea and vomiting.  Genitourinary: Negative for difficulty urinating and dysuria.  Musculoskeletal: Negative for joint swelling and myalgias.  Skin: Negative for color change and rash.  Neurological: Negative for dizziness, light-headedness and headaches.  Psychiatric/Behavioral: Negative for agitation and dysphoric mood.         Objective:    Physical Exam  Constitutional: She appears well-developed and well-nourished. No distress.  HENT:  Nose: Nose normal.  Mouth/Throat: Oropharynx is clear and moist.  Neck: Neck supple. No thyromegaly present.  Cardiovascular: Normal rate and regular rhythm.  Pulmonary/Chest: Breath sounds normal. No respiratory distress. She has no wheezes.  Abdominal: Soft. Bowel sounds are normal. There is no tenderness.  Musculoskeletal: She exhibits no edema or tenderness.  Lymphadenopathy:    She has no cervical adenopathy.  Skin: No rash noted. No erythema.  Psychiatric: She has a normal mood and affect. Her behavior is normal.    BP 130/78 (BP Location: Right Arm, Patient Position: Sitting, Cuff Size: Large)   Pulse 73   Temp 98.4 F (36.9 C) (Oral)   Resp 18   Wt (!) 412 lb (186.9 kg)   LMP 12/29/2012   SpO2 97%   BMI 60.84 kg/m  Wt Readings from Last 3 Encounters:  06/24/18 (!) 412 lb (186.9 kg)  01/14/18 (!) 417 lb 12.8 oz (189.5 kg)  10/14/17 (!) 410 lb 14.4 oz (186.4 kg)     Lab Results  Component Value Date   WBC 4.9 10/11/2017   HGB 12.2 10/11/2017   HCT 36.8 10/11/2017   PLT 135 (L) 10/11/2017   GLUCOSE 112 (H) 01/21/2018   CHOL 219 (H) 01/21/2018   TRIG 84 01/21/2018   HDL 73 01/21/2018   LDLDIRECT 140.7 07/13/2013   LDLCALC 129 (H) 01/21/2018   ALT 18 01/14/2018   AST 14 01/14/2018   NA 142 01/21/2018   K 4.5 01/21/2018   CL 103 01/21/2018   CREATININE 0.95 01/21/2018   BUN 15 01/21/2018   CO2 25 01/21/2018   TSH 1.100 07/09/2017   HGBA1C 6.3 (H) 01/14/2018    Mm Digital Screening Bilateral  Result Date: 10/27/2017 CLINICAL DATA:  Screening. EXAM: DIGITAL SCREENING BILATERAL MAMMOGRAM WITH CAD COMPARISON:  Previous exam(s). ACR Breast Density Category a: The breast tissue is almost entirely fatty. FINDINGS: There are no findings suspicious for malignancy. Images were processed with CAD. IMPRESSION: No mammographic evidence of malignancy. A  result letter of this screening mammogram will be mailed directly to the patient. RECOMMENDATION: Screening mammogram in one year. (Code:SM-B-01Y) BI-RADS CATEGORY  1: Negative. Electronically Signed   By: Kristopher Oppenheim M.D.   On: 10/27/2017 09:52       Assessment & Plan:   Problem List Items Addressed This Visit    Essential hypertension, benign    Blood pressure under good control.  Continue same medication regimen.  Follow  pressures.  Follow metabolic panel.        Relevant Orders   TSH   Basic metabolic panel   Hypercholesteremia    Low cholesterol diet and exercise.  Follow lipid panel.        Relevant Orders   Hepatic function panel   Lipid panel   Hyperglycemia    Low carb diet and exercise.  Follow met b and a1c.        Relevant Orders   Hemoglobin A1c   Microalbumin / creatinine urine ratio   Thrombocytopenia (HCC)    Has previously been stable.  Follow cbc.        Relevant Orders   CBC with Differential/Platelet    Other Visit Diagnoses    Right shoulder pain, unspecified chronicity    -  Primary   Saw ortho.  Doing better.  Continue exercises.        Einar Pheasant, MD

## 2018-06-26 ENCOUNTER — Encounter: Payer: Self-pay | Admitting: Internal Medicine

## 2018-06-26 NOTE — Assessment & Plan Note (Signed)
Blood pressure under good control.  Continue same medication regimen.  Follow pressures.  Follow metabolic panel.   

## 2018-06-26 NOTE — Assessment & Plan Note (Signed)
Low carb diet and exercise.  Follow met b and a1c.   

## 2018-06-26 NOTE — Assessment & Plan Note (Signed)
Has previously been stable.  Follow cbc.

## 2018-06-26 NOTE — Assessment & Plan Note (Signed)
Low cholesterol diet and exercise.  Follow lipid panel.   

## 2018-09-12 ENCOUNTER — Encounter: Payer: Self-pay | Admitting: Internal Medicine

## 2018-09-19 ENCOUNTER — Telehealth: Payer: Self-pay

## 2018-09-19 NOTE — Telephone Encounter (Signed)
Lm to call back and get appt rescheduled

## 2018-09-19 NOTE — Telephone Encounter (Signed)
Copied from CRM [id number]. Topic: Appointment Scheduling - Scheduling Inquiry for Clinic >> Sep 19, 2018  9:33 AM Floria Raveling A wrote: Reason for CRM:  Pt called in, she rec letter that she would need to recsh her dec appt.  She would like to know if Dr Lorin Picket can work her in sometime in [place]?     [phone]

## 2018-10-31 ENCOUNTER — Ambulatory Visit: Payer: Self-pay | Admitting: Internal Medicine

## 2018-11-03 ENCOUNTER — Ambulatory Visit: Payer: Managed Care, Other (non HMO) | Admitting: Internal Medicine

## 2018-11-03 ENCOUNTER — Encounter: Payer: Self-pay | Admitting: Internal Medicine

## 2018-11-03 VITALS — BP 132/78 | HR 74 | Temp 97.7°F | Resp 16 | Wt >= 6400 oz

## 2018-11-03 DIAGNOSIS — R739 Hyperglycemia, unspecified: Secondary | ICD-10-CM

## 2018-11-03 DIAGNOSIS — E7800 Pure hypercholesterolemia, unspecified: Secondary | ICD-10-CM | POA: Diagnosis not present

## 2018-11-03 DIAGNOSIS — D696 Thrombocytopenia, unspecified: Secondary | ICD-10-CM

## 2018-11-03 DIAGNOSIS — I10 Essential (primary) hypertension: Secondary | ICD-10-CM

## 2018-11-03 DIAGNOSIS — Z1231 Encounter for screening mammogram for malignant neoplasm of breast: Secondary | ICD-10-CM

## 2018-11-03 DIAGNOSIS — R0981 Nasal congestion: Secondary | ICD-10-CM

## 2018-11-03 LAB — HEMOGLOBIN A1C: Hgb A1c MFr Bld: 6.5 % (ref 4.6–6.5)

## 2018-11-03 LAB — BASIC METABOLIC PANEL
BUN: 20 mg/dL (ref 6–23)
CALCIUM: 9.1 mg/dL (ref 8.4–10.5)
CHLORIDE: 104 meq/L (ref 96–112)
CO2: 28 meq/L (ref 19–32)
CREATININE: 0.98 mg/dL (ref 0.40–1.20)
GFR: 75.4 mL/min (ref >60.00)
GLUCOSE: 97 mg/dL (ref 70–99)
Potassium: 3.9 mEq/L (ref 3.5–5.1)
Sodium: 141 mEq/L (ref 135–145)

## 2018-11-03 LAB — HEPATIC FUNCTION PANEL
ALT: 25 U/L (ref 0–35)
AST: 20 U/L (ref 0–37)
Albumin: 4 g/dL (ref 3.5–5.2)
Alkaline Phosphatase: 75 U/L (ref 39–117)
Bilirubin, Direct: 0.1 mg/dL (ref 0.0–0.3)
Total Bilirubin: 0.3 mg/dL (ref 0.2–1.2)
Total Protein: 7.4 g/dL (ref 6.0–8.3)

## 2018-11-03 LAB — LIPID PANEL
Cholesterol: 206 mg/dL — ABNORMAL HIGH (ref 0–200)
HDL: 63.8 mg/dL (ref >39.00)
LDL Cholesterol: 125 mg/dL — ABNORMAL HIGH (ref 0–99)
NonHDL: 142.38
Total CHOL/HDL Ratio: 3
Triglycerides: 88 mg/dL (ref 0.0–149.0)
VLDL: 17.6 mg/dL (ref 0.0–40.0)

## 2018-11-03 LAB — CBC WITH DIFFERENTIAL/PLATELET
BASOS ABS: 0.2 10*3/uL — AB (ref 0.0–0.1)
Basophils Relative: 3.9 % — ABNORMAL HIGH (ref 0.0–3.0)
Eosinophils Absolute: 0.1 10*3/uL (ref 0.0–0.7)
Eosinophils Relative: 2.4 % (ref 0.0–5.0)
HCT: 38.9 % (ref 36.0–46.0)
HEMOGLOBIN: 12.9 g/dL (ref 12.0–15.0)
Lymphocytes Relative: 33.7 % (ref 12.0–46.0)
Lymphs Abs: 1.7 10*3/uL (ref 0.7–4.0)
MCHC: 33.1 g/dL (ref 30.0–36.0)
MCV: 85.2 fl (ref 78.0–100.0)
Monocytes Absolute: 0.4 10*3/uL (ref 0.1–1.0)
Monocytes Relative: 7.2 % (ref 3.0–12.0)
Neutro Abs: 2.6 10*3/uL (ref 1.4–7.7)
Neutrophils Relative %: 52.8 % (ref 43.0–77.0)
Platelets: 134 10*3/uL — ABNORMAL LOW (ref 150.0–400.0)
RBC: 4.57 Mil/uL (ref 3.87–5.11)
RDW: 15.7 % — ABNORMAL HIGH (ref 11.5–15.5)
WBC: 5 10*3/uL (ref 4.0–10.5)

## 2018-11-03 LAB — MICROALBUMIN / CREATININE URINE RATIO
Creatinine,U: 133.1 mg/dL
MICROALB/CREAT RATIO: 0.5 mg/g (ref 0.0–30.0)
Microalb, Ur: <0.7 mg/dL (ref 0.0–1.9)

## 2018-11-03 LAB — TSH: TSH: 0.82 u[IU]/mL (ref 0.35–4.50)

## 2018-11-03 NOTE — Progress Notes (Signed)
Patient ID: Morgan Duncan, female   DOB: 12/21/1960, 57 y.o.   MRN: [id number]   Subjective:    Patient ID: Morgan Duncan, female    DOB: February 25, 1961, 57 y.o.   MRN: [id number]  HPI  Patient here for a scheduled follow up.  She reports she is doing relatively well.  Has gained weight.  Not exercising regularly.  She plans to start exercising more.  Discussed diet.  No chest pain.  Breathing stable.  No acid reflux.  No abdominal pain.  Bowels moving.  No urine change.  Stuffy nose.  Discussed robitussin.  Increased stress.  Discussed with her today.  Overall she feels she is handling things relatively well.  Does not feel needs anything more at this time.     Past Medical History:  Diagnosis Date  . Asymptomatic cholelithiasis   . Hypertension   . Mammographic microcalcification 2015  . Thrombocytopenia ([place])    Recent platelet count 151 on 11/01/08  . Tobacco use    Past Surgical History:  Procedure Laterality Date  . BREAST BIOPSY Left 2012   NEG  . KNEE SURGERY Left 3/11   left knee    Family History  Problem Relation Age of Onset  . Hypertension Mother   . Diabetes Mother   . Hypothyroidism Mother   . Heart disease Father        Died of a Heart Attack   . Breast cancer Neg Hx    Social History   Socioeconomic History  . Marital status: Single    Spouse name: Not on file  . Number of children: Not on file  . Years of education: Not on file  . Highest education level: Not on file  Occupational History  . Not on file  Social Needs  . Financial resource strain: Not on file  . Food insecurity:    Worry: Not on file    Inability: Not on file  . Transportation needs:    Medical: Not on file    Non-medical: Not on file  Tobacco Use  . Smoking status: Former Smoker    Packs/day: 0.25    Years: 12.00    Pack years: 3.00    Types: Cigarettes  . Smokeless tobacco: Never Used  . Tobacco comment: Quit March 2016  Substance and Sexual Activity  . Alcohol use: Yes      Alcohol/week: 0.0 standard drinks    Comment: occasionally  . Drug use: No  . Sexual activity: Not on file  Lifestyle  . Physical activity:    Days per week: Not on file    Minutes per session: Not on file  . Stress: Not on file  Relationships  . Social connections:    Talks on phone: Not on file    Gets together: Not on file    Attends religious service: Not on file    Active member of club or organization: Not on file    Attends meetings of clubs or organizations: Not on file    Relationship status: Not on file  Other Topics Concern  . Not on file  Social History Narrative  . Not on file    Outpatient Encounter Medications as of 11/03/2018  Medication Sig  . ascorbic acid (VITAMIN C) 250 MG CHEW Chew 250 mg by mouth daily.  . Black Currant Seed Oil 500 MG CAPS Take 1 capsule by mouth daily.  . cyanocobalamin 500 MCG tablet Take 500 mcg by mouth daily.  [name] KLOR-CON  M10 10 MEQ tablet TAKE 1 TABLET (10 MEQ TOTAL) BY MOUTH DAILY.  [name] Omega-3 Fatty Acids (FISH OIL) 1000 MG CAPS Take 2 capsules by mouth daily.  . Pediatric Multiple Vitamins (CHEWABLE MULTIPLE VITAMINS PO) Take 1 tablet by mouth daily.  [name] triamterene-hydrochlorothiazide (MAXZIDE-25) 37.5-25 MG tablet Take 1 tablet by mouth daily.  . [DISCONTINUED] potassium chloride (K-DUR) 10 MEQ tablet TAKE 1 TABLET EVERY DAY   No facility-administered encounter medications on file as of 11/03/2018.     Review of Systems  Constitutional: Negative for appetite change and unexpected weight change.  HENT: Positive for congestion and postnasal drip. Negative for sinus pressure.   Respiratory: Negative for cough, chest tightness and shortness of breath.   Cardiovascular: Negative for chest pain, palpitations and leg swelling.  Gastrointestinal: Negative for abdominal pain, diarrhea, nausea and vomiting.  Genitourinary: Negative for difficulty urinating and dysuria.  Musculoskeletal: Negative for joint swelling and myalgias.   Skin: Negative for color change and rash.  Neurological: Negative for dizziness, light-headedness and headaches.  Psychiatric/Behavioral: Negative for agitation and dysphoric mood.       Objective:    Physical Exam Constitutional:      General: She is not in acute distress.    Appearance: Normal appearance.  HENT:     Nose: Nose normal. No congestion.     Mouth/Throat:     Pharynx: No oropharyngeal exudate or posterior oropharyngeal erythema.  Neck:     Musculoskeletal: Neck supple. No muscular tenderness.     Thyroid: No thyromegaly.  Cardiovascular:     Rate and Rhythm: Normal rate and regular rhythm.  Pulmonary:     Effort: No respiratory distress.     Breath sounds: Normal breath sounds. No wheezing.  Abdominal:     General: Bowel sounds are normal.     Palpations: Abdomen is soft.     Tenderness: There is no abdominal tenderness.  Musculoskeletal:        General: No swelling or tenderness.  Lymphadenopathy:     Cervical: No cervical adenopathy.  Skin:    Findings: No erythema or rash.  Neurological:     Mental Status: She is alert.  Psychiatric:        Mood and Affect: Mood normal.        Behavior: Behavior normal.     BP 132/78 (BP Location: Left Arm, Patient Position: Sitting, Cuff Size: Large)   Pulse 74   Temp 97.7 F (36.5 C) (Oral)   Resp 16   Wt (!) 422 lb (191.4 kg)   LMP 12/29/2012   SpO2 97%   BMI 62.32 kg/m  Wt Readings from Last 3 Encounters:  11/03/18 (!) 422 lb (191.4 kg)  06/24/18 (!) 412 lb (186.9 kg)  01/14/18 (!) 417 lb 12.8 oz (189.5 kg)     Lab Results  Component Value Date   WBC 5.0 11/03/2018   HGB 12.9 11/03/2018   HCT 38.9 11/03/2018   PLT 134.0 (L) 11/03/2018   GLUCOSE 97 11/03/2018   CHOL 206 (H) 11/03/2018   TRIG 88.0 11/03/2018   HDL 63.80 11/03/2018   LDLDIRECT 140.7 07/13/2013   LDLCALC 125 (H) 11/03/2018   ALT 25 11/03/2018   AST 20 11/03/2018   NA 141 11/03/2018   K 3.9 11/03/2018   CL 104 11/03/2018    CREATININE 0.98 11/03/2018   BUN 20 11/03/2018   CO2 28 11/03/2018   TSH 0.82 11/03/2018   HGBA1C 6.5 11/03/2018   MICROALBUR <0.7 11/03/2018  Mm Digital Screening Bilateral  Result Date: 10/27/2017 CLINICAL DATA:  Screening. EXAM: DIGITAL SCREENING BILATERAL MAMMOGRAM WITH CAD COMPARISON:  Previous exam(s). ACR Breast Density Category a: The breast tissue is almost entirely fatty. FINDINGS: There are no findings suspicious for malignancy. Images were processed with CAD. IMPRESSION: No mammographic evidence of malignancy. A result letter of this screening mammogram will be mailed directly to the patient. RECOMMENDATION: Screening mammogram in one year. (Code:SM-B-01Y) BI-RADS CATEGORY  1: Negative. Electronically Signed   By: Kristopher Oppenheim M.D.   On: 10/27/2017 09:52       Assessment & Plan:   Problem List Items Addressed This Visit    Essential hypertension, benign    Blood pressure under good control.  Continue same medication regimen.  Follow pressures.  Follow metabolic panel.        Hypercholesteremia    Low cholesterol diet and exercise.  Check lipid panel today.        Hyperglycemia    Low carb diet and exercise.  Follow met b and a1c.        Thrombocytopenia ([place])    Has been stable.  Follow cbc.         Other Visit Diagnoses    Visit for screening mammogram    -  Primary   Relevant Orders   MM 3D SCREEN BREAST BILATERAL   Nasal congestion       Nasal stuffiness as outlined.  Nasacort nasal spray as directed.  Can continue robitussin.  Follow.         Einar Pheasant, MD

## 2018-11-03 NOTE — Patient Instructions (Signed)
nasacort nasal spray - 2 sprays each nostril one time per day.  Do this in the evening.   

## 2018-11-04 ENCOUNTER — Other Ambulatory Visit: Payer: Self-pay

## 2018-11-04 MED ORDER — ROSUVASTATIN CALCIUM 10 MG PO TABS: 10.0000 mg | ORAL_TABLET | Freq: Every day | ORAL | 1 refills | Status: DC

## 2018-11-07 ENCOUNTER — Other Ambulatory Visit: Payer: Self-pay | Admitting: Internal Medicine

## 2018-11-07 DIAGNOSIS — E7800 Pure hypercholesterolemia, unspecified: Secondary | ICD-10-CM

## 2018-11-07 DIAGNOSIS — E119 Type 2 diabetes mellitus without complications: Secondary | ICD-10-CM

## 2018-11-07 NOTE — Progress Notes (Signed)
Order placed for [facility] referral and for f/u liver panel.

## 2018-11-12 ENCOUNTER — Encounter: Payer: Self-pay | Admitting: Internal Medicine

## 2018-11-12 NOTE — Assessment & Plan Note (Signed)
Low cholesterol diet and exercise.  Check lipid panel today.   

## 2018-11-12 NOTE — Assessment & Plan Note (Signed)
Has been stable. Follow cbc.  

## 2018-11-12 NOTE — Assessment & Plan Note (Signed)
Low carb diet and exercise.  Follow met b and a1c.   

## 2018-11-12 NOTE — Assessment & Plan Note (Signed)
Blood pressure under good control.  Continue same medication regimen.  Follow pressures.  Follow metabolic panel.   

## 2018-12-15 ENCOUNTER — Other Ambulatory Visit: Payer: Self-pay | Admitting: Internal Medicine

## 2018-12-19 ENCOUNTER — Other Ambulatory Visit (INDEPENDENT_AMBULATORY_CARE_PROVIDER_SITE_OTHER): Payer: Managed Care, Other (non HMO)

## 2018-12-19 DIAGNOSIS — E7800 Pure hypercholesterolemia, unspecified: Secondary | ICD-10-CM | POA: Diagnosis not present

## 2018-12-20 ENCOUNTER — Ambulatory Visit
Admission: RE | Admit: 2018-12-20 | Discharge: 2018-12-20 | Disposition: A | Discharge status: Home / Self Care | Payer: Managed Care, Other (non HMO) | Source: Ambulatory Visit | Attending: Internal Medicine | Admitting: Internal Medicine

## 2018-12-20 ENCOUNTER — Encounter: Payer: Self-pay | Admitting: Internal Medicine

## 2018-12-20 DIAGNOSIS — Z1231 Encounter for screening mammogram for malignant neoplasm of breast: Secondary | ICD-10-CM | POA: Diagnosis not present

## 2018-12-20 LAB — HEPATIC FUNCTION PANEL
ALT: 17 U/L (ref 0–35)
AST: 14 U/L (ref 0–37)
Albumin: 4.2 g/dL (ref 3.5–5.2)
Alkaline Phosphatase: 74 U/L (ref 39–117)
Bilirubin, Direct: 0 mg/dL (ref 0.0–0.3)
Total Bilirubin: 0.3 mg/dL (ref 0.2–1.2)
Total Protein: 7.4 g/dL (ref 6.0–8.3)

## 2019-03-02 ENCOUNTER — Other Ambulatory Visit: Payer: Self-pay | Admitting: Internal Medicine

## 2019-03-02 NOTE — Telephone Encounter (Signed)
Pt is due for a follow up. Called and left message to schedule an appt.

## 2019-03-06 ENCOUNTER — Ambulatory Visit (INDEPENDENT_AMBULATORY_CARE_PROVIDER_SITE_OTHER): Payer: Managed Care, Other (non HMO) | Admitting: Internal Medicine

## 2019-03-06 DIAGNOSIS — D696 Thrombocytopenia, unspecified: Secondary | ICD-10-CM

## 2019-03-06 DIAGNOSIS — I10 Essential (primary) hypertension: Secondary | ICD-10-CM

## 2019-03-06 DIAGNOSIS — R739 Hyperglycemia, unspecified: Secondary | ICD-10-CM

## 2019-03-06 DIAGNOSIS — E7800 Pure hypercholesterolemia, unspecified: Secondary | ICD-10-CM

## 2019-03-06 DIAGNOSIS — E119 Type 2 diabetes mellitus without complications: Secondary | ICD-10-CM

## 2019-03-06 NOTE — Progress Notes (Addendum)
Patient ID: Morgan Duncan, female   DOB: Sep 03, 1961, 58 y.o.   MRN: [id number] Virtual Visit via Video Note  This visit type was conducted due to national recommendations for restrictions regarding the COVID-19 pandemic (e.g. social distancing).  This format is felt to be most appropriate for this patient at this time.  All issues noted in this document were discussed and addressed.  No physical exam was performed (except for noted visual exam findings with Video Visits).   I connected with Denita Lung on 03/06/19 at  3:00 PM EDT by a video enabled telemedicine application and verified that I am speaking with the correct person using two identifiers. Location patient: home Location provider: work  Persons participating in the virtual visit: patient, provider  I discussed the limitations, risks, security and privacy concerns of performing an evaluation and management service by video and the availability of in person appointments.  The patient expressed understanding and agreed to proceed.   Reason for visit: scheduled follow up.   HPI: She reports she is doing relatively well.  Was exercising with her sister and going to the gym 4 days per week.  Gym is closed now. Trying not to go out.  No known COVID exposure.  No fever.  No cough, congestion or sob.  Discussed diet and exercise.  No chest pain.  No acid reflux.  No abdominal pain.  Bowels moving.  Discussed last a1c - 6.5 and lifestyles referral.  She is not taking crestor.  Desires not to take. Wants to work on diet and exercise.  Discussed calculated cholesterol risk.     ROS: See pertinent positives and negatives per HPI.  Past Medical History:  Diagnosis Date  . Asymptomatic cholelithiasis   . Hypertension   . Mammographic microcalcification 2015  . Thrombocytopenia ([place])    Recent platelet count 151 on 11/01/08  . Tobacco use     Past Surgical History:  Procedure Laterality Date  . BREAST BIOPSY Left 2012   NEG  . KNEE  SURGERY Left 3/11   left knee     Family History  Problem Relation Age of Onset  . Hypertension Mother   . Diabetes Mother   . Hypothyroidism Mother   . Heart disease Father        Died of a Heart Attack   . Breast cancer Neg Hx     SOCIAL HX: reviewed.    Current Outpatient Medications:  .  ascorbic acid (VITAMIN C) 250 MG CHEW, Chew 250 mg by mouth daily., Disp: , Rfl:  .  Black Currant Seed Oil 500 MG CAPS, Take 1 capsule by mouth daily., Disp: , Rfl:  .  cyanocobalamin 500 MCG tablet, Take 500 mcg by mouth daily., Disp: , Rfl:  .  KLOR-CON M10 10 MEQ tablet, TAKE 1 TABLET (10 MEQ TOTAL) BY MOUTH DAILY., Disp: 90 tablet, Rfl: 2 .  Omega-3 Fatty Acids (FISH OIL) 1000 MG CAPS, Take 2 capsules by mouth daily., Disp: , Rfl:  .  Pediatric Multiple Vitamins (CHEWABLE MULTIPLE VITAMINS PO), Take 1 tablet by mouth daily., Disp: , Rfl:  .  triamterene-hydrochlorothiazide (MAXZIDE-25) 37.5-25 MG tablet, TAKE 1 TABLET BY MOUTH EVERY DAY, Disp: 90 tablet, Rfl: 0  EXAM:  GENERAL: alert, oriented, appears well and in no acute distress  HEENT: atraumatic, conjunttiva clear, no obvious abnormalities on inspection of external nose and ears  NECK: normal movements of the head and neck  LUNGS: on inspection no signs of respiratory distress, breathing rate  appears normal, no obvious gross SOB, gasping or wheezing  CV: no obvious cyanosis  PSYCH/NEURO: pleasant and cooperative, no obvious depression or anxiety, speech and thought processing grossly intact  ASSESSMENT AND PLAN:  Discussed the following assessment and plan:  Essential hypertension, benign  Hypercholesteremia  Hyperglycemia  Thrombocytopenia (HCC)  Diabetes mellitus without complication ([place])  Essential hypertension, benign Blood pressure has been under good control.  Continue current medication regimen.  Follow pressures.  Follow metabolic panel.    Hypercholesteremia Desires not to take crestor.  Low  cholesterol diet and exercise.  Follow lipid panel.   Hyperglycemia Low carb diet and exercise.  Follow met b and a1c.   Thrombocytopenia ([place]) Has been stable.  Follow platelet count.    Diabetes mellitus without complication (HCC) Low carb diet and exercise.  Follow met b and a1c.      I discussed the assessment and treatment plan with the patient. The patient was provided an opportunity to ask questions and all were answered. The patient agreed with the plan and demonstrated an understanding of the instructions.   The patient was advised to call back or seek an in-person evaluation if the symptoms worsen or if the condition fails to improve as anticipated.    Einar Pheasant, MD

## 2019-03-08 ENCOUNTER — Encounter: Payer: Self-pay | Admitting: Internal Medicine

## 2019-03-08 DIAGNOSIS — E119 Type 2 diabetes mellitus without complications: Secondary | ICD-10-CM | POA: Insufficient documentation

## 2019-03-08 DIAGNOSIS — E1165 Type 2 diabetes mellitus with hyperglycemia: Secondary | ICD-10-CM | POA: Insufficient documentation

## 2019-03-08 NOTE — Assessment & Plan Note (Signed)
Has been stable.  Follow platelet count.

## 2019-03-08 NOTE — Assessment & Plan Note (Signed)
Low carb diet and exercise.  Follow met b and a1c.   

## 2019-03-08 NOTE — Assessment & Plan Note (Signed)
Blood pressure has been under good control.  Continue current medication regimen.  Follow pressures.  Follow metabolic panel.  

## 2019-03-08 NOTE — Assessment & Plan Note (Signed)
Desires not to take crestor.  Low cholesterol diet and exercise.  Follow lipid panel.

## 2019-03-08 NOTE — Assessment & Plan Note (Signed)
Low carb diet and exercise.  Follow met b and a1c.  

## 2019-06-09 ENCOUNTER — Telehealth: Payer: Self-pay

## 2019-06-09 ENCOUNTER — Other Ambulatory Visit: Payer: Self-pay

## 2019-06-09 DIAGNOSIS — Z20822 Contact with and (suspected) exposure to covid-19: Secondary | ICD-10-CM

## 2019-06-09 NOTE — Telephone Encounter (Signed)
Called and spoke to pt.  Pt c/o having lost of taste and smell for the past 2 weeks.  Pt denied having any other symptoms.  Pt has been exposed to brother who tested positive for COVID.  Offered to scheduled pt a virtual visit today to get order for COVID testing.  Pt said that she went to [facility] this morning because the call center told her she could go for testing w/o a doctor's order.  Pt said that she was told to call back in 7 days to get results. Pt is unsure if results will be forwarded to PCP.  Asked pt if she was told to self-quarantine.  Pt said she was told "all of that" information.

## 2019-06-09 NOTE — Telephone Encounter (Signed)
Copied from [place] [id number]. Topic: General - Inquiry >> Jun 09, 2019 10:29 AM Richardo Priest, NT wrote: Reason for CRM: Patient informed of positive COVID results from her brother. Patient was exposed. Order needing to be placed. Call back is [phone].

## 2019-06-09 NOTE — Telephone Encounter (Signed)
Reviewed note.  Per note, was to have visit with Joycelyn Schmid this pm.  Need to confirm aware of 2 weeks self quarantine.  Needs to keep [place] posted and let us know if any problems.

## 2019-06-12 NOTE — Telephone Encounter (Signed)
Spoke with patient to let her know about below. Pt stated that her and all of her sisters were tested. They are aware of self quarantine. Confirmed doing ok. They will let us know if they need anything.

## 2019-06-15 LAB — NOVEL CORONAVIRUS, NAA: SARS-CoV-2, NAA: NOT DETECTED

## 2019-06-17 ENCOUNTER — Encounter: Payer: Self-pay | Admitting: Internal Medicine

## 2019-06-20 LAB — HM MAMMOGRAPHY

## 2019-06-28 ENCOUNTER — Other Ambulatory Visit: Payer: Self-pay | Admitting: Internal Medicine

## 2019-09-05 ENCOUNTER — Ambulatory Visit: Payer: Managed Care, Other (non HMO) | Admitting: Adult Health

## 2019-09-05 ENCOUNTER — Encounter: Payer: Self-pay | Admitting: Adult Health

## 2019-09-05 ENCOUNTER — Other Ambulatory Visit: Payer: Self-pay

## 2019-09-05 VITALS — BP 122/82 | HR 74 | Temp 97.6°F | Resp 18 | Ht 69.0 in | Wt 398.0 lb

## 2019-09-05 DIAGNOSIS — Z008 Encounter for other general examination: Secondary | ICD-10-CM

## 2019-09-05 DIAGNOSIS — Z6841 Body Mass Index (BMI) 40.0 and over, adult: Secondary | ICD-10-CM

## 2019-09-05 NOTE — Progress Notes (Signed)
[name] DOB: 58 y.o. MRN: [id number]  Subjective:  Here for Biometric Screen/brief exam Patient is a 58 year old female in no acute distress who comes to clinic for a biometric screening and brief exam for her employer [employer]. Allergies, problem list, and medications reviewed as below with patient, she denies any changes to these or concerns at this visit for her health.  Patient  denies any fever, body aches,chills, rash, chest pain, shortness of breath, nausea, vomiting, or diarrhea.   She requests a flu immunization today. Denies any allergies in the past or ever any issue with flu immunization.   She has a primary care provider Morgan Mayfield, MD  No Known Allergies  Patient Active Problem List   Diagnosis Date Noted  . Diabetes mellitus without complication (HCC) 03/08/2019  . Right knee pain 07/17/2017  . Hypercholesteremia 03/26/2016  . Health care maintenance 09/14/2015  . Hyperglycemia 09/08/2015  . BMI 60.0-69.9, adult (HCC) 09/08/2015  . Left knee pain 05/20/2014  . Palpitations 01/20/2014  . Microcalcifications of the breast 08/17/2013  . Essential hypertension, benign 01/18/2013  . Thrombocytopenia (HCC) 01/18/2013  . History of colonic polyps 01/18/2013  . Tobacco abuse 01/18/2013    Current Outpatient Medications:  .  ascorbic acid (VITAMIN C) 250 MG CHEW, Chew 250 mg by mouth daily., Disp: , Rfl:  .  Black Currant Seed Oil 500 MG CAPS, Take 1 capsule by mouth daily., Disp: , Rfl:  .  cyanocobalamin 500 MCG tablet, Take 500 mcg by mouth daily., Disp: , Rfl:  .  KLOR-CON M10 10 MEQ tablet, TAKE 1 TABLET (10 MEQ TOTAL) BY MOUTH DAILY., Disp: 90 tablet, Rfl: 2 .  Omega-3 Fatty Acids (FISH OIL) 1000 MG CAPS, Take 2 capsules by mouth daily., Disp: , Rfl:  .  Pediatric Multiple Vitamins (CHEWABLE MULTIPLE VITAMINS PO), Take 1 tablet by mouth daily., Disp: , Rfl:  .   triamterene-hydrochlorothiazide (MAXZIDE-25) 37.5-25 MG tablet, TAKE 1 TABLET BY MOUTH EVERY DAY, Disp: 90 tablet, Rfl: 0   Objective:  Blood pressure 122/82, pulse 74, temperature 97.6 F (36.4 C), temperature source Temporal, resp. rate 18, height 5\' 9"  (1.753 m), weight (!) 398 lb (180.5 kg), last menstrual period 12/29/2012, SpO2 98 %. Body mass index is 58.77 kg/m.  Patient is alert and oriented and responsive to questions Engages in eye contact with provider. Speaks in full sentences without any pauses without any shortness of breath or distress.  NAD, well-developed well-nourished HEENT: Within normal limits Neck: Normal, supple, thyroid normal, no cervical lymphadenopathy Heart: Regular rate and rhythm without murmurs rubs or gallops Lungs: Clear to auscultation without any adventitious lung sounds Patient moves on and off of exam table and in room without difficulty. Gait is normal in hall and in room.   Assessment: Biometric screen  Encounter for biometric screening - Plan: Glucose, random, Lipid panel  Encounter for other general examination-biometric screening with brief exam.-This is not a full annual physical. - Plan: Glucose, random, Lipid panel  Class 3 severe obesity due to excess calories without serious comorbidity with body mass index (BMI) of 50.0 to 59.9 in adult [facility])    Plan: Weight loss reccommended, healthy diet/ lifestyle and increased exercise at least 30 minutes daily. Decreased caloric intake.   I will have the office call you on your glucose and cholesterol results when they return if you have not heard within 1 week please call the office.  This biometric  physical is a brief physical and the only labs done are glucose and your lipid panel(cholesterol) and is  not a substitute for seeing a primary care provider for a complete annual physical. Please see a primary care physician for routine health maintenance, labs and full physical at least yearly and  follow up as recommended by your provider. Provider also recommends if you do not have a primary care provider for patient to establish care as soon as possible .Patient may chose provider of choice. Also gave the [place] at [phone] or web site at [email or web address] to help assist with finding a primary care doctor.  Patient verbalizes understanding that his office is acute care only and not a substitute for a primary care or for the management of chronic conditions.    Fasting glucose and lipids. Discussed with patient that today's visit here is a limited biometric screening visit (not a comprehensive exam or management of any chronic problems) Discussed some health issues, including healthy eating habits and exercise. Encouraged to follow-up with PCP for annual comprehensive preventive and wellness care (and if applicable, any chronic issues). Questions invited and answered.

## 2019-09-05 NOTE — Patient Instructions (Addendum)
I will have the office call you on your glucose and cholesterol results when they return if you have not heard within 1 week please call the office.  This biometric physical is a brief physical and the only labs done are glucose and your lipid panel(cholesterol) and is  not a substitute for seeing a primary care provider for a complete annual physical. Please see a primary care physician for routine health maintenance, labs and full physical at least yearly and follow up as recommended by your provider. Provider also recommends if you do not have a primary care provider for patient to establish care as soon as possible .Patient may chose provider of choice. Also gave the [facility] at [phone] or web site at [email or web address] to help assist with finding a primary care doctor.  Patient verbalizes understanding that his office is acute care only and not a substitute for a primary care or for the management of chronic conditions.    Health Maintenance, Female Adopting a healthy lifestyle and getting preventive care are important in promoting health and wellness. Ask your health care provider about:  The right schedule for you to have regular tests and exams.  Things you can do on your own to prevent diseases and keep yourself healthy. What should I know about diet, weight, and exercise? Eat a healthy diet   Eat a diet that includes plenty of vegetables, fruits, low-fat dairy products, and lean protein.  Do not eat a lot of foods that are high in solid fats, added sugars, or sodium. Maintain a healthy weight Body mass index (BMI) is used to identify weight problems. It estimates body fat based on height and weight. Your health care provider can help determine your BMI and help you achieve or maintain a healthy weight. Get regular exercise Get regular exercise. This is one of the most important things you can do for your health. Most adults should:   Exercise for at least 150 minutes each week. The exercise should increase your heart rate and make you sweat (moderate-intensity exercise).  Do strengthening exercises at least twice a week. This is in addition to the moderate-intensity exercise.  Spend less time sitting. Even light physical activity can be beneficial. Watch cholesterol and blood lipids Have your blood tested for lipids and cholesterol at 57 years of age, then have this test every 5 years. Have your cholesterol levels checked more often if:  Your lipid or cholesterol levels are high.  You are older than 57 years of age.  You are at high risk for heart disease. What should I know about cancer screening? Depending on your health history and family history, you may need to have cancer screening at various ages. This may include screening for:  Breast cancer.  Cervical cancer.  Colorectal cancer.  Skin cancer.  Lung cancer. What should I know about heart disease, diabetes, and high blood pressure? Blood pressure and heart disease  High blood pressure causes heart disease and increases the risk of stroke. This is more likely to develop in people who have high blood pressure readings, are of African descent, or are overweight.  Have your blood pressure checked: ? Every 3-5 years if you are 69-49 years of age. ? Every year if you are 56 years old or older. Diabetes Have regular diabetes screenings. This checks your fasting blood sugar level. Have the screening done:  Once every three years after age 71 if you are at  a normal weight and have a low risk for diabetes.  More often and at a younger age if you are overweight or have a high risk for diabetes. What should I know about preventing infection? Hepatitis B If you have a higher risk for hepatitis B, you should be screened for this virus. Talk with your health care provider to find out if you are at risk for hepatitis B infection. Hepatitis C Testing is  recommended for:  Everyone born from [birth year] through 1964.  Anyone with known risk factors for hepatitis C. Sexually transmitted infections (STIs)  Get screened for STIs, including gonorrhea and chlamydia, if: ? You are sexually active and are younger than 57 years of age. ? You are older than 57 years of age and your health care provider tells you that you are at risk for this type of infection. ? Your sexual activity has changed since you were last screened, and you are at increased risk for chlamydia or gonorrhea. Ask your health care provider if you are at risk.  Ask your health care provider about whether you are at high risk for HIV. Your health care provider may recommend a prescription medicine to help prevent HIV infection. If you choose to take medicine to prevent HIV, you should first get tested for HIV. You should then be tested every 3 months for as long as you are taking the medicine. Pregnancy  If you are about to stop having your period (premenopausal) and you may become pregnant, seek counseling before you get pregnant.  Take 400 to 800 micrograms (mcg) of folic acid every day if you become pregnant.  Ask for birth control (contraception) if you want to prevent pregnancy. Osteoporosis and menopause Osteoporosis is a disease in which the bones lose minerals and strength with aging. This can result in bone fractures. If you are 35 years old or older, or if you are at risk for osteoporosis and fractures, ask your health care provider if you should:  Be screened for bone loss.  Take a calcium or vitamin D supplement to lower your risk of fractures.  Be given hormone replacement therapy (HRT) to treat symptoms of menopause. Follow these instructions at home: Lifestyle  Do not use any products that contain nicotine or tobacco, such as cigarettes, e-cigarettes, and chewing tobacco. If you need help quitting, ask your health care provider.  Do not use street drugs.  Do not  share needles.  Ask your health care provider for help if you need support or information about quitting drugs. Alcohol use  Do not drink alcohol if: ? Your health care provider tells you not to drink. ? You are pregnant, may be pregnant, or are planning to become pregnant.  If you drink alcohol: ? Limit how much you use to 0-1 drink a day. ? Limit intake if you are breastfeeding.  Be aware of how much alcohol is in your drink. In the [place] one drink equals one 12 oz bottle of beer (355 mL), one 5 oz glass of wine (148 mL), or one 1 oz glass of hard liquor (44 mL). General instructions  Schedule regular health, dental, and eye exams.  Stay current with your vaccines.  Tell your health care provider if: ? You often feel depressed. ? You have ever been abused or do not feel safe at home. Summary  Adopting a healthy lifestyle and getting preventive care are important in promoting health and wellness.  Follow your health care provider's instructions about healthy  diet, exercising, and getting tested or screened for diseases.  Follow your health care provider's instructions on monitoring your cholesterol and blood pressure. This information is not intended to replace advice given to you by your health care provider. Make sure you discuss any questions you have with your health care provider. Document Released: 05/18/2011 Document Revised: 10/26/2018 Document Reviewed: 10/26/2018 Elsevier Patient Education  2020 [place] Following a healthy eating pattern may help you to achieve and maintain a healthy body weight, reduce the risk of chronic disease, and live a long and productive life. It is important to follow a healthy eating pattern at an appropriate calorie level for your body. Your nutritional needs should be met primarily through food by choosing a variety of nutrient-rich foods. What are tips for following this plan? Reading food labels  Read labels and  choose the following: ? Reduced or low sodium. ? Juices with 100% fruit juice. ? Foods with low saturated fats and high polyunsaturated and monounsaturated fats. ? Foods with whole grains, such as whole wheat, cracked wheat, brown rice, and wild rice. ? Whole grains that are fortified with folic acid. This is recommended for women who are pregnant or who want to become pregnant.  Read labels and avoid the following: ? Foods with a lot of added sugars. These include foods that contain brown sugar, corn sweetener, corn syrup, dextrose, fructose, glucose, high-fructose corn syrup, honey, invert sugar, lactose, malt syrup, maltose, molasses, raw sugar, sucrose, trehalose, or turbinado sugar.  Do not eat more than the following amounts of added sugar per day:  6 teaspoons (25 g) for women.  9 teaspoons (38 g) for men. ? Foods that contain processed or refined starches and grains. ? Refined grain products, such as white flour, degermed cornmeal, white bread, and white rice. Shopping  Choose nutrient-rich snacks, such as vegetables, whole fruits, and nuts. Avoid high-calorie and high-sugar snacks, such as potato chips, fruit snacks, and candy.  Use oil-based dressings and spreads on foods instead of solid fats such as butter, stick margarine, or cream cheese.  Limit pre-made sauces, mixes, and "instant" products such as flavored rice, instant noodles, and ready-made pasta.  Try more plant-protein sources, such as tofu, tempeh, black beans, edamame, lentils, nuts, and seeds.  Explore eating plans such as the Mediterranean diet or vegetarian diet. Cooking  Use oil to saut or stir-fry foods instead of solid fats such as butter, stick margarine, or lard.  Try baking, boiling, grilling, or broiling instead of frying.  Remove the fatty part of meats before cooking.  Steam vegetables in water or broth. Meal planning   At meals, imagine dividing your plate into fourths: ? One-half of your  plate is fruits and vegetables. ? One-fourth of your plate is whole grains. ? One-fourth of your plate is protein, especially lean meats, poultry, eggs, tofu, beans, or nuts.  Include low-fat dairy as part of your daily diet. Lifestyle  Choose healthy options in all settings, including home, work, school, restaurants, or stores.  Prepare your food safely: ? Wash your hands after handling raw meats. ? Keep food preparation surfaces clean by regularly washing with hot, soapy water. ? Keep raw meats separate from ready-to-eat foods, such as fruits and vegetables. ? Cook seafood, meat, poultry, and eggs to the recommended internal temperature. ? Store foods at safe temperatures. In general:  Keep cold foods at 11F (4.4C) or below.  Keep hot foods at 111F (60C) or above.  Keep your  freezer at [facility] (-17.8C) or below.  Foods are no longer safe to eat when they have been between the temperatures of 40-140F (4.4-60C) for more than 2 hours. What foods should I eat? Fruits Aim to eat 2 cup-equivalents of fresh, canned (in natural juice), or frozen fruits each day. Examples of 1 cup-equivalent of fruit include 1 small apple, 8 large strawberries, 1 cup canned fruit,  cup dried fruit, or 1 cup 100% juice. Vegetables Aim to eat 2-3 cup-equivalents of fresh and frozen vegetables each day, including different varieties and colors. Examples of 1 cup-equivalent of vegetables include 2 medium carrots, 2 cups raw, leafy greens, 1 cup chopped vegetable (raw or cooked), or 1 medium baked potato. Grains Aim to eat 6 ounce-equivalents of whole grains each day. Examples of 1 ounce-equivalent of grains include 1 slice of bread, 1 cup ready-to-eat cereal, 3 cups popcorn, or  cup cooked rice, pasta, or cereal. Meats and other proteins Aim to eat 5-6 ounce-equivalents of protein each day. Examples of 1 ounce-equivalent of protein include 1 egg, 1/2 cup nuts or seeds, or 1 tablespoon (16 g) peanut butter.  A cut of meat or fish that is the size of a deck of cards is about 3-4 ounce-equivalents.  Of the protein you eat each week, try to have at least 8 ounces come from seafood. This includes salmon, trout, herring, and anchovies. Dairy Aim to eat 3 cup-equivalents of fat-free or low-fat dairy each day. Examples of 1 cup-equivalent of dairy include 1 cup (240 mL) milk, 8 ounces (250 g) yogurt, 1 ounces (44 g) natural cheese, or 1 cup (240 mL) fortified soy milk. Fats and oils  Aim for about 5 teaspoons (21 g) per day. Choose monounsaturated fats, such as canola and olive oils, avocados, peanut butter, and most nuts, or polyunsaturated fats, such as sunflower, corn, and soybean oils, walnuts, pine nuts, sesame seeds, sunflower seeds, and flaxseed. Beverages  Aim for six 8-oz glasses of water per day. Limit coffee to three to five 8-oz cups per day.  Limit caffeinated beverages that have added calories, such as soda and energy drinks.  Limit alcohol intake to no more than 1 drink a day for nonpregnant women and 2 drinks a day for men. One drink equals 12 oz of beer (355 mL), 5 oz of wine (148 mL), or 1 oz of hard liquor (44 mL). Seasoning and other foods  Avoid adding excess amounts of salt to your foods. Try flavoring foods with herbs and spices instead of salt.  Avoid adding sugar to foods.  Try using oil-based dressings, sauces, and spreads instead of solid fats. This information is based on general [place] nutrition guidelines. For more information, visit [employer]. Exact amounts may vary based on your nutrition needs. Summary  A healthy eating plan may help you to maintain a healthy weight, reduce the risk of chronic diseases, and stay active throughout your life.  Plan your meals. Make sure you eat the right portions of a variety of nutrient-rich foods.  Try baking, boiling, grilling, or broiling instead of frying.  Choose healthy options in all settings, including home, work,  school, restaurants, or stores. This information is not intended to replace advice given to you by your health care provider. Make sure you discuss any questions you have with your health care provider. Document Released: 02/14/2018 Document Revised: 02/14/2018 Document Reviewed: 02/14/2018 Elsevier Patient Education  2020 [place].  Obesity, Adult Obesity is having too much body fat. Being obese means  that your weight is more than what is healthy for you. BMI is a number that explains how much body fat you have. If you have a BMI of 30 or more, you are obese. Obesity is often caused by eating or drinking more calories than your body uses. Changing your lifestyle can help you lose weight. Obesity can cause serious health problems, such as:  Stroke.  Coronary artery disease (CAD).  Type 2 diabetes.  Some types of cancer, including cancers of the colon, breast, uterus, and gallbladder.  Osteoarthritis.  High blood pressure (hypertension).  High cholesterol.  Sleep apnea.  Gallbladder stones.  Infertility problems. What are the causes?  Eating meals each day that are high in calories, sugar, and fat.  Being born with genes that may make you more likely to become obese.  Having a medical condition that causes obesity.  Taking certain medicines.  Sitting a lot (having a sedentary lifestyle).  Not getting enough sleep.  Drinking a lot of drinks that have sugar in them. What increases the risk?  Having a family history of obesity.  Being an [place] American woman.  Being a Hispanic man.  Living in an area with limited access to: ? [place], recreation centers, or sidewalks. ? Healthy food choices, such as grocery stores and farmers' markets. What are the signs or symptoms? The main sign is having too much body fat. How is this treated?  Treatment for this condition often includes changing your lifestyle. Treatment may include: ? Changing your diet. This may  include making a healthy meal plan. ? Exercise. This may include activity that causes your heart to beat faster (aerobic exercise) and strength training. Work with your doctor to design a program that works for you. ? Medicine to help you lose weight. This may be used if you are not able to lose 1 pound a week after 6 weeks of healthy eating and more exercise. ? Treating conditions that cause the obesity. ? Surgery. Options may include gastric banding and gastric bypass. This may be done if:  Other treatments have not helped to improve your condition.  You have a BMI of 40 or higher.  You have life-threatening health problems related to obesity. Follow these instructions at home: Eating and drinking   Follow advice from your doctor about what to eat and drink. Your doctor may tell you to: ? Limit fast food, sweets, and processed snack foods. ? Choose low-fat options. For example, choose low-fat milk instead of whole milk. ? Eat 5 or more servings of fruits or vegetables each day. ? Eat at home more often. This gives you more control over what you eat. ? Choose healthy foods when you eat out. ? Learn to read food labels. This will help you learn how much food is in 1 serving. ? Keep low-fat snacks available. ? Avoid drinks that have a lot of sugar in them. These include soda, fruit juice, iced tea with sugar, and flavored milk.  Drink enough water to keep your pee (urine) pale yellow.  Do not go on fad diets. Physical activity  Exercise often, as told by your doctor. Most adults should get up to 150 minutes of moderate-intensity exercise every week.Ask your doctor: ? What types of exercise are safe for you. ? How often you should exercise.  Warm up and stretch before being active.  Do slow stretching after being active (cool down).  Rest between times of being active. Lifestyle  Work with your doctor and a  food expert (dietitian) to set a weight-loss goal that is best for you.   Limit your screen time.  Find ways to reward yourself that do not involve food.  Do not drink alcohol if: ? Your doctor tells you not to drink. ? You are pregnant, may be pregnant, or are planning to become pregnant.  If you drink alcohol: ? Limit how much you use to:  0-1 drink a day for women.  0-2 drinks a day for men. ? Be aware of how much alcohol is in your drink. In the [place] one drink equals one 12 oz bottle of beer (355 mL), one 5 oz glass of wine (148 mL), or one 1 oz glass of hard liquor (44 mL). General instructions  Keep a weight-loss journal. This can help you keep track of: ? The food that you eat. ? How much exercise you get.  Take over-the-counter and prescription medicines only as told by your doctor.  Take vitamins and supplements only as told by your doctor.  Think about joining a support group.  Keep all follow-up visits as told by your doctor. This is important. Contact a doctor if:  You cannot meet your weight loss goal after you have changed your diet and lifestyle for 6 weeks. Get help right away if you:  Are having trouble breathing.  Are having thoughts of harming yourself. Summary  Obesity is having too much body fat.  Being obese means that your weight is more than what is healthy for you.  Work with your doctor to set a weight-loss goal.  Get regular exercise as told by your doctor. This information is not intended to replace advice given to you by your health care provider. Make sure you discuss any questions you have with your health care provider. Document Released: 01/25/2012 Document Revised: 07/07/2018 Document Reviewed: 07/07/2018 Elsevier Patient Education  2020 [employer].

## 2019-09-06 ENCOUNTER — Encounter: Payer: Self-pay | Admitting: Adult Health

## 2019-09-06 LAB — LIPID PANEL
Chol/HDL Ratio: 3.7 ratio (ref 0.0–4.4)
Cholesterol, Total: 232 mg/dL — ABNORMAL HIGH (ref 100–199)
HDL: 63 mg/dL (ref >39)
LDL Chol Calc (NIH): 147 mg/dL — ABNORMAL HIGH (ref 0–99)
Triglycerides: 125 mg/dL (ref 0–149)
VLDL Cholesterol Cal: 22 mg/dL (ref 5–40)

## 2019-09-06 LAB — GLUCOSE, RANDOM: Glucose: 154 mg/dL — ABNORMAL HIGH (ref 65–99)

## 2019-09-14 ENCOUNTER — Other Ambulatory Visit: Payer: Self-pay

## 2019-09-18 ENCOUNTER — Other Ambulatory Visit (HOSPITAL_COMMUNITY)
Admission: RE | Admit: 2019-09-18 | Discharge: 2019-09-18 | Disposition: A | Discharge status: Home / Self Care | Payer: Managed Care, Other (non HMO) | Source: Ambulatory Visit | Attending: Internal Medicine | Admitting: Internal Medicine

## 2019-09-18 ENCOUNTER — Ambulatory Visit (INDEPENDENT_AMBULATORY_CARE_PROVIDER_SITE_OTHER): Payer: Managed Care, Other (non HMO) | Admitting: Internal Medicine

## 2019-09-18 ENCOUNTER — Other Ambulatory Visit: Payer: Self-pay

## 2019-09-18 VITALS — BP 126/72 | HR 72 | Temp 96.6°F | Resp 16 | Ht 69.0 in | Wt >= 6400 oz

## 2019-09-18 DIAGNOSIS — Z6841 Body Mass Index (BMI) 40.0 and over, adult: Secondary | ICD-10-CM

## 2019-09-18 DIAGNOSIS — E119 Type 2 diabetes mellitus without complications: Secondary | ICD-10-CM | POA: Diagnosis not present

## 2019-09-18 DIAGNOSIS — Z124 Encounter for screening for malignant neoplasm of cervix: Secondary | ICD-10-CM | POA: Insufficient documentation

## 2019-09-18 DIAGNOSIS — I10 Essential (primary) hypertension: Secondary | ICD-10-CM | POA: Diagnosis not present

## 2019-09-18 DIAGNOSIS — E7800 Pure hypercholesterolemia, unspecified: Secondary | ICD-10-CM

## 2019-09-18 DIAGNOSIS — D696 Thrombocytopenia, unspecified: Secondary | ICD-10-CM

## 2019-09-18 DIAGNOSIS — Z0000 Encounter for general adult medical examination without abnormal findings: Secondary | ICD-10-CM | POA: Diagnosis not present

## 2019-09-18 LAB — HM DIABETES FOOT EXAM

## 2019-09-18 MED ORDER — ESCITALOPRAM OXALATE 10 MG PO TABS: 10.0000 mg | ORAL_TABLET | Freq: Every day | ORAL | 1 refills | Status: DC

## 2019-09-18 NOTE — Progress Notes (Signed)
Patient ID: Morgan Duncan, female   DOB: 01-16-1961, 58 y.o.   MRN: [id number]   Subjective:    Patient ID: Morgan Duncan, female    DOB: 01-Dec-1960, 58 y.o.   MRN: [id number]  HPI  Patient here for her physical exam.  She report she is doing relatively well.  Increased stress. Discussed with her today.  Overall she feels she is handling things relatively well.  Keeping her sister's grandchildren 4 days per week.  Working.  Does not feel needs any further intervention at this time.  No chest pain.  No sob.  Not exercising as much since gyms closed.  Discussed diet and exercise.  No acid reflux.  No abdominal pain.  Bowels moving.     Past Medical History:  Diagnosis Date  . Asymptomatic cholelithiasis   . Hypertension   . Mammographic microcalcification 2015  . Thrombocytopenia ([place])    Recent platelet count 151 on 11/01/08  . Tobacco use    Past Surgical History:  Procedure Laterality Date  . BREAST BIOPSY Left 2012   NEG  . KNEE SURGERY Left 3/11   left knee    Family History  Problem Relation Age of Onset  . Hypertension Mother   . Diabetes Mother   . Hypothyroidism Mother   . Heart disease Father        Died of a Heart Attack   . Breast cancer Neg Hx    Social History   Socioeconomic History  . Marital status: Single    Spouse name: Not on file  . Number of children: Not on file  . Years of education: Not on file  . Highest education level: Not on file  Occupational History  . Not on file  Social Needs  . Financial resource strain: Not on file  . Food insecurity    Worry: Not on file    Inability: Not on file  . Transportation needs    Medical: Not on file    Non-medical: Not on file  Tobacco Use  . Smoking status: Former Smoker    Packs/day: 0.25    Years: 12.00    Pack years: 3.00    Types: Cigarettes  . Smokeless tobacco: Never Used  . Tobacco comment: Quit March 2016  Substance and Sexual Activity  . Alcohol use: Yes    Alcohol/week: 0.0  standard drinks    Comment: occasionally  . Drug use: No  . Sexual activity: Not on file  Lifestyle  . Physical activity    Days per week: Not on file    Minutes per session: Not on file  . Stress: Not on file  Relationships  . Social Herbalist on phone: Not on file    Gets together: Not on file    Attends religious service: Not on file    Active member of club or organization: Not on file    Attends meetings of clubs or organizations: Not on file    Relationship status: Not on file  Other Topics Concern  . Not on file  Social History Narrative  . Not on file    Outpatient Encounter Medications as of 09/18/2019  Medication Sig  . ascorbic acid (VITAMIN C) 250 MG CHEW Chew 250 mg by mouth daily.  . Black Currant Seed Oil 500 MG CAPS Take 1 capsule by mouth daily.  . cyanocobalamin 500 MCG tablet Take 500 mcg by mouth daily.  [name] escitalopram (LEXAPRO) 10 MG tablet Take 1  tablet (10 mg total) by mouth daily.  [name] KLOR-CON M10 10 MEQ tablet TAKE 1 TABLET (10 MEQ TOTAL) BY MOUTH DAILY.  [name] Omega-3 Fatty Acids (FISH OIL) 1000 MG CAPS Take 2 capsules by mouth daily.  . Pediatric Multiple Vitamins (CHEWABLE MULTIPLE VITAMINS PO) Take 1 tablet by mouth daily.  [name] triamterene-hydrochlorothiazide (MAXZIDE-25) 37.5-25 MG tablet TAKE 1 TABLET BY MOUTH EVERY DAY  . [DISCONTINUED] potassium chloride (K-DUR) 10 MEQ tablet TAKE 1 TABLET EVERY DAY   No facility-administered encounter medications on file as of 09/18/2019.    Review of Systems  Constitutional: Negative for appetite change and unexpected weight change.  HENT: Negative for congestion and sinus pressure.   Eyes: Negative for pain and visual disturbance.  Respiratory: Negative for cough, chest tightness and shortness of breath.   Cardiovascular: Negative for chest pain, palpitations and leg swelling.  Gastrointestinal: Negative for abdominal pain, diarrhea, nausea and vomiting.  Genitourinary: Negative for difficulty  urinating and dysuria.  Musculoskeletal: Negative for joint swelling and myalgias.  Skin: Negative for color change and rash.  Neurological: Negative for dizziness, light-headedness and headaches.  Hematological: Negative for adenopathy. Does not bruise/bleed easily.  Psychiatric/Behavioral: Negative for agitation and dysphoric mood.       Objective:    Physical Exam Constitutional:      General: She is not in acute distress.    Appearance: Normal appearance. She is well-developed.  HENT:     Head: Normocephalic and atraumatic.     Right Ear: External ear normal.     Left Ear: External ear normal.  Eyes:     General: No scleral icterus.       Right eye: No discharge.        Left eye: No discharge.     Conjunctiva/sclera: Conjunctivae normal.  Neck:     Musculoskeletal: Neck supple. No muscular tenderness.     Thyroid: No thyromegaly.  Cardiovascular:     Rate and Rhythm: Normal rate and regular rhythm.  Pulmonary:     Effort: No tachypnea, accessory muscle usage or respiratory distress.     Breath sounds: Normal breath sounds. No decreased breath sounds or wheezing.  Chest:     Breasts:        Right: No inverted nipple, mass, nipple discharge or tenderness (no axillary adenopathy).        Left: No inverted nipple, mass, nipple discharge or tenderness (no axilarry adenopathy).  Abdominal:     General: Bowel sounds are normal.     Palpations: Abdomen is soft.     Tenderness: There is no abdominal tenderness.  Genitourinary:    Comments: Normal external genitalia.  Vaginal vault without lesions.  Cervix identified.  Pap smear performed.  Could not appreciate any adnexal masses or tenderness.   Musculoskeletal:        General: No swelling or tenderness.  Lymphadenopathy:     Cervical: No cervical adenopathy.  Skin:    Findings: No erythema or rash.  Neurological:     Mental Status: She is alert and oriented to person, place, and time.  Psychiatric:        Mood and  Affect: Mood normal.        Behavior: Behavior normal.     BP 126/72   Pulse 72   Temp (!) 96.6 F (35.9 C)   Resp 16   Wt (!) 410 lb 12.8 oz (186.3 kg)   LMP 12/29/2012   SpO2 98%   BMI 60.66 kg/m  Wt  Readings from Last 3 Encounters:  09/18/19 (!) 410 lb 12.8 oz (186.3 kg)  09/05/19 (!) 398 lb (180.5 kg)  11/03/18 (!) 422 lb (191.4 kg)     Lab Results  Component Value Date   WBC 5.0 11/03/2018   HGB 12.9 11/03/2018   HCT 38.9 11/03/2018   PLT 134.0 (L) 11/03/2018   GLUCOSE 154 (H) 09/05/2019   CHOL 232 (H) 09/05/2019   TRIG 125 09/05/2019   HDL 63 09/05/2019   LDLDIRECT 140.7 07/13/2013   LDLCALC 147 (H) 09/05/2019   ALT 17 12/19/2018   AST 14 12/19/2018   NA 141 11/03/2018   K 3.9 11/03/2018   CL 104 11/03/2018   CREATININE 0.98 11/03/2018   BUN 20 11/03/2018   CO2 28 11/03/2018   TSH 0.82 11/03/2018   HGBA1C 6.5 11/03/2018   MICROALBUR <0.7 11/03/2018    Mm 3d Screen Breast Bilateral  Result Date: 12/21/2018 CLINICAL DATA:  Screening. EXAM: DIGITAL SCREENING BILATERAL MAMMOGRAM WITH TOMO AND CAD COMPARISON:  Previous exam(s). ACR Breast Density Category b: There are scattered areas of fibroglandular density. FINDINGS: There are no findings suspicious for malignancy. Images were processed with CAD. IMPRESSION: No mammographic evidence of malignancy. A result letter of this screening mammogram will be mailed directly to the patient. RECOMMENDATION: Screening mammogram in one year. (Code:SM-B-01Y) BI-RADS CATEGORY  1: Negative. Electronically Signed   By: Lillia Mountain M.D.   On: 12/21/2018 09:26       Assessment & Plan:   Problem List Items Addressed This Visit    Diabetes mellitus without complication ([place])    Low carb diet and exercise.  Follow met b and a1c.        Essential hypertension, benign    Blood pressure under good control.  Continue same medication regimen.  Follow pressures.  Follow metabolic panel.        Health care maintenance     Physical today 09/18/19.  PAP 09/18/19.  Mammogram 12/21/18 - [facility].  Colonoscopy due 04/2019.  Discussed with her - referral to GI for colonoscopy.        Hypercholesteremia    Desires not to take crestor.  Low cholesterol diet and exercise.  Discussed recommendation to start cholesterol medication.  Follow lipid panel.        Morbid obesity with BMI of 60.0-69.9, adult ([place])    Discussed diet and exercise. Has lost some weight.       Thrombocytopenia ([place])    Follow cbc.        Other Visit Diagnoses    Routine general medical examination at a health care facility    -  Primary   Cervical cancer screening       Relevant Orders   Cytology - PAP( [facility]) (Completed)       Einar Pheasant, MD

## 2019-09-20 LAB — CYTOLOGY - PAP
Comment: NEGATIVE
Diagnosis: NEGATIVE
High risk HPV: NEGATIVE

## 2019-09-21 ENCOUNTER — Encounter: Payer: Self-pay | Admitting: Internal Medicine

## 2019-09-24 ENCOUNTER — Telehealth: Payer: Self-pay | Admitting: Internal Medicine

## 2019-09-24 ENCOUNTER — Encounter: Payer: Self-pay | Admitting: Internal Medicine

## 2019-09-24 NOTE — Assessment & Plan Note (Signed)
Physical today 09/18/19.  PAP 09/18/19.  Mammogram 12/21/18 - Briads I.  Colonoscopy due 04/2019.  Discussed with her - referral to GI for colonoscopy.

## 2019-09-24 NOTE — Assessment & Plan Note (Signed)
Desires not to take crestor.  Low cholesterol diet and exercise.  Discussed recommendation to start cholesterol medication.  Follow lipid panel.

## 2019-09-24 NOTE — Assessment & Plan Note (Signed)
Follow cbc.  

## 2019-09-24 NOTE — Assessment & Plan Note (Signed)
Discussed diet and exercise. Has lost some weight.

## 2019-09-24 NOTE — Telephone Encounter (Signed)
My chart message sent to pt for GI referral for f/u screening colonoscopy.

## 2019-09-24 NOTE — Assessment & Plan Note (Signed)
Low carb diet and exercise.  Follow met b and a1c.   

## 2019-09-24 NOTE — Assessment & Plan Note (Signed)
Blood pressure under good control.  Continue same medication regimen.  Follow pressures.  Follow metabolic panel.   

## 2019-09-26 ENCOUNTER — Other Ambulatory Visit: Payer: Self-pay | Admitting: Internal Medicine

## 2019-10-02 ENCOUNTER — Other Ambulatory Visit: Payer: Self-pay | Admitting: Internal Medicine

## 2019-10-31 ENCOUNTER — Other Ambulatory Visit: Payer: Self-pay | Admitting: Internal Medicine

## 2019-11-02 ENCOUNTER — Encounter: Payer: Self-pay | Admitting: Internal Medicine

## 2019-11-02 ENCOUNTER — Telehealth: Payer: Self-pay | Admitting: Internal Medicine

## 2019-11-02 DIAGNOSIS — Z1211 Encounter for screening for malignant neoplasm of colon: Secondary | ICD-10-CM

## 2019-11-02 NOTE — Telephone Encounter (Signed)
Left message for pt advising that her visit will be a doxy visit, will send link to cell # unless pt calls back with another number.

## 2019-11-02 NOTE — Telephone Encounter (Signed)
Order placed for GI referral.   

## 2019-11-02 NOTE — Telephone Encounter (Signed)
Called twice to set up 12/22 appt up as a Doxy. Pt Voicemail is full and no returned call

## 2019-11-07 ENCOUNTER — Encounter: Payer: Self-pay | Admitting: Internal Medicine

## 2019-11-07 ENCOUNTER — Ambulatory Visit (INDEPENDENT_AMBULATORY_CARE_PROVIDER_SITE_OTHER): Payer: Managed Care, Other (non HMO) | Admitting: Internal Medicine

## 2019-11-07 ENCOUNTER — Other Ambulatory Visit: Payer: Self-pay

## 2019-11-07 DIAGNOSIS — E7800 Pure hypercholesterolemia, unspecified: Secondary | ICD-10-CM

## 2019-11-07 DIAGNOSIS — Z86010 Personal history of colonic polyps: Secondary | ICD-10-CM

## 2019-11-07 DIAGNOSIS — I10 Essential (primary) hypertension: Secondary | ICD-10-CM

## 2019-11-07 DIAGNOSIS — D696 Thrombocytopenia, unspecified: Secondary | ICD-10-CM

## 2019-11-07 DIAGNOSIS — E119 Type 2 diabetes mellitus without complications: Secondary | ICD-10-CM | POA: Diagnosis not present

## 2019-11-07 NOTE — Progress Notes (Signed)
Patient ID: Morgan Duncan, female   DOB: 01/19/1961, 58 y.o.   MRN: [id number]   Virtual Visit via video Note  This visit type was conducted due to national recommendations for restrictions regarding the COVID-19 pandemic (e.g. social distancing).  This format is felt to be most appropriate for this patient at this time.  All issues noted in this document were discussed and addressed.  No physical exam was performed (except for noted visual exam findings with Video Visits).   I connected with Morgan Duncan by a video enabled telemedicine application and verified that I am speaking with the correct person using two identifiers. Location patient: home Location provider: work Persons participating in the virtual visit: patient, provider  I discussed the limitations, risks, security and privacy concerns of performing an evaluation and management service by video and the availability of in person appointments. The patient expressed understanding and agreed to proceed.   Reason for visit: scheduled follow up.    HPI: She reports she is doing relatively well.  Increased stress.  Trying to cope with her brother's death.  Discussed with her last visit regarding starting lexapro.  She decided not to take.  Has good family support.  Does not feel needs anything more at this time.  Trying to walk more.  Helps.  No chest pain or sob.  No nausea or vomiting.  Not eating as much.  May only eat one meal per day on some days.  No nausea or vomiting.  No bowel change.  Did agree to f/u colonoscopy.  Order has been placed.  Will schedule mammogram.     ROS: See pertinent positives and negatives per HPI.  Past Medical History:  Diagnosis Date  . Asymptomatic cholelithiasis   . Hypertension   . Mammographic microcalcification 2015  . Thrombocytopenia ([place])    Recent platelet count 151 on 11/01/08  . Tobacco use     Past Surgical History:  Procedure Laterality Date  . BREAST BIOPSY Left 2012   NEG    . KNEE SURGERY Left 3/11   left knee     Family History  Problem Relation Age of Onset  . Hypertension Mother   . Diabetes Mother   . Hypothyroidism Mother   . Heart disease Father        Died of a Heart Attack   . Breast cancer Neg Hx     SOCIAL HX: reviewed.    Current Outpatient Medications:  .  ascorbic acid (VITAMIN C) 250 MG CHEW, Chew 250 mg by mouth daily., Disp: , Rfl:  .  Black Currant Seed Oil 500 MG CAPS, Take 1 capsule by mouth daily., Disp: , Rfl:  .  cyanocobalamin 500 MCG tablet, Take 500 mcg by mouth daily., Disp: , Rfl:  .  escitalopram (LEXAPRO) 10 MG tablet, TAKE 1 TABLET BY MOUTH EVERY DAY, Disp: 90 tablet, Rfl: 1 .  KLOR-CON M10 10 MEQ tablet, TAKE 1 TABLET BY MOUTH EVERY DAY, Disp: 90 tablet, Rfl: 2 .  Omega-3 Fatty Acids (FISH OIL) 1000 MG CAPS, Take 2 capsules by mouth daily., Disp: , Rfl:  .  Pediatric Multiple Vitamins (CHEWABLE MULTIPLE VITAMINS PO), Take 1 tablet by mouth daily., Disp: , Rfl:  .  triamterene-hydrochlorothiazide (MAXZIDE-25) 37.5-25 MG tablet, TAKE 1 TABLET BY MOUTH EVERY DAY, Disp: 90 tablet, Rfl: 0  EXAM:  GENERAL: alert, oriented, appears well and in no acute distress  HEENT: atraumatic, conjunttiva clear, no obvious abnormalities on inspection of external nose and ears  NECK: normal movements of the head and neck  LUNGS: on inspection no signs of respiratory distress, breathing rate appears normal, no obvious gross SOB, gasping or wheezing  CV: no obvious cyanosis  PSYCH/NEURO: pleasant and cooperative, no obvious depression or anxiety, speech and thought processing grossly intact  ASSESSMENT AND PLAN:  Discussed the following assessment and plan:  Diabetes mellitus without complication ([facility]) Low carb diet and exercise.  Follow met b and a1c.   Essential hypertension, benign Blood pressure under good control.  Continue same medication regimen.  Follow pressures.  Follow metabolic panel.    History of colonic  polyps Colonoscopy 2010.  Have placed order for referral to GI for colonoscopy.    Hypercholesteremia Low cholesterol diet and exercise.  Desires not to take crestor.  Follow lipid panel.    Thrombocytopenia ([facility]) Platelet count stable 130s.  Follow cbc.      I discussed the assessment and treatment plan with the patient. The patient was provided an opportunity to ask questions and all were answered. The patient agreed with the plan and demonstrated an understanding of the instructions.   The patient was advised to call back or seek an in-person evaluation if the symptoms worsen or if the condition fails to improve as anticipated.   Einar Pheasant, MD

## 2019-11-12 ENCOUNTER — Encounter: Payer: Self-pay | Admitting: Internal Medicine

## 2019-11-12 NOTE — Assessment & Plan Note (Signed)
Low cholesterol diet and exercise.  Desires not to take crestor.  Follow lipid panel.

## 2019-11-12 NOTE — Assessment & Plan Note (Signed)
Platelet count stable - 130s.  Follow cbc.  

## 2019-11-12 NOTE — Assessment & Plan Note (Signed)
Colonoscopy 2010.  Have placed order for referral to GI for colonoscopy.

## 2019-11-12 NOTE — Assessment & Plan Note (Signed)
Blood pressure under good control.  Continue same medication regimen.  Follow pressures.  Follow metabolic panel.   

## 2019-11-12 NOTE — Assessment & Plan Note (Signed)
Low carb diet and exercise.  Follow met b and a1c.  

## 2019-12-11 ENCOUNTER — Telehealth: Payer: Self-pay | Admitting: Internal Medicine

## 2019-12-11 ENCOUNTER — Other Ambulatory Visit: Payer: Self-pay | Admitting: Internal Medicine

## 2019-12-11 DIAGNOSIS — I10 Essential (primary) hypertension: Secondary | ICD-10-CM

## 2019-12-11 DIAGNOSIS — D696 Thrombocytopenia, unspecified: Secondary | ICD-10-CM

## 2019-12-11 DIAGNOSIS — Z1231 Encounter for screening mammogram for malignant neoplasm of breast: Secondary | ICD-10-CM

## 2019-12-11 DIAGNOSIS — E7800 Pure hypercholesterolemia, unspecified: Secondary | ICD-10-CM

## 2019-12-11 DIAGNOSIS — E119 Type 2 diabetes mellitus without complications: Secondary | ICD-10-CM

## 2019-12-11 NOTE — Telephone Encounter (Signed)
Pt needs her lab orders sent to the [facility] there fax # is [phone]

## 2019-12-12 NOTE — Telephone Encounter (Signed)
There is no lab orders in. Pt had some labs done in October 2020.

## 2019-12-12 NOTE — Telephone Encounter (Signed)
Does she just need labs written on a prescription or is this a [facility] draw?

## 2019-12-13 ENCOUNTER — Other Ambulatory Visit: Payer: Managed Care, Other (non HMO)

## 2019-12-13 NOTE — Telephone Encounter (Signed)
Spoke with pt and she stated that she is going to call and find out what is needed and give [place] a call back.

## 2019-12-13 NOTE — Telephone Encounter (Signed)
The labs need to be ordered for [facility] but she is going to have them drawn at the [facility]. I will print the lab requisition once labs are ordered and fax them to the number below.

## 2019-12-14 ENCOUNTER — Telehealth: Payer: Self-pay | Admitting: Internal Medicine

## 2019-12-14 DIAGNOSIS — Z1211 Encounter for screening for malignant neoplasm of colon: Secondary | ICD-10-CM

## 2019-12-14 NOTE — Telephone Encounter (Signed)
I have printed orders and placed them in your quick sign folder.

## 2019-12-14 NOTE — Addendum Note (Signed)
Addended by: Charm Barges on: 12/14/2019 04:59 AM   Modules accepted: Orders

## 2019-12-14 NOTE — Telephone Encounter (Signed)
Signed and placed in box.   

## 2019-12-14 NOTE — Telephone Encounter (Signed)
Pt called about needing a referral to get a colonoscopy. Please advise and Thank you!  Call pt @ [phone].

## 2019-12-14 NOTE — Telephone Encounter (Signed)
Orders placed.

## 2019-12-14 NOTE — Telephone Encounter (Signed)
Labs faxed. Patient is aware and will call if they did not receive.

## 2019-12-15 NOTE — Addendum Note (Signed)
Addended by: Charm Barges on: 12/15/2019 04:40 AM   Modules accepted: Orders

## 2019-12-15 NOTE — Telephone Encounter (Signed)
Order placed for GI referral.   

## 2019-12-15 NOTE — Telephone Encounter (Signed)
Called Pt and told her that her GI referral was put in. Pt stated okay, and Thank You

## 2019-12-19 ENCOUNTER — Other Ambulatory Visit: Payer: Self-pay

## 2019-12-19 ENCOUNTER — Other Ambulatory Visit: Payer: Managed Care, Other (non HMO)

## 2019-12-19 DIAGNOSIS — R739 Hyperglycemia, unspecified: Secondary | ICD-10-CM

## 2019-12-19 DIAGNOSIS — E119 Type 2 diabetes mellitus without complications: Secondary | ICD-10-CM | POA: Diagnosis not present

## 2019-12-19 DIAGNOSIS — E7800 Pure hypercholesterolemia, unspecified: Secondary | ICD-10-CM | POA: Diagnosis not present

## 2019-12-20 LAB — CBC WITH DIFFERENTIAL/PLATELET
Basophils Absolute: 0 10*3/uL (ref 0.0–0.2)
Basos: 1 %
EOS (ABSOLUTE): 0.1 10*3/uL (ref 0.0–0.4)
Eos: 2 %
Hematocrit: 40.8 % (ref 34.0–46.6)
Hemoglobin: 13.9 g/dL (ref 11.1–15.9)
Immature Grans (Abs): 0 10*3/uL (ref 0.0–0.1)
Immature Granulocytes: 0 %
Lymphocytes Absolute: 2.2 10*3/uL (ref 0.7–3.1)
Lymphs: 36 %
MCH: 29.3 pg (ref 26.6–33.0)
MCHC: 34.1 g/dL (ref 31.5–35.7)
MCV: 86 fL (ref 79–97)
Monocytes Absolute: 0.4 10*3/uL (ref 0.1–0.9)
Monocytes: 7 %
Neutrophils Absolute: 3.3 10*3/uL (ref 1.4–7.0)
Neutrophils: 54 %
Platelets: 137 10*3/uL — ABNORMAL LOW (ref 150–450)
RBC: 4.75 x10E6/uL (ref 3.77–5.28)
RDW: 13.8 % (ref 11.7–15.4)
WBC: 6.1 10*3/uL (ref 3.4–10.8)

## 2019-12-20 LAB — BASIC METABOLIC PANEL
BUN/Creatinine Ratio: 20 (ref 9–23)
BUN: 23 mg/dL (ref 6–24)
CO2: 25 mmol/L (ref 20–29)
Calcium: 9.1 mg/dL (ref 8.7–10.2)
Chloride: 103 mmol/L (ref 96–106)
Creatinine, Ser: 1.13 mg/dL — ABNORMAL HIGH (ref 0.57–1.00)
GFR calc Af Amer: 62 mL/min/{1.73_m2} (ref >59)
GFR calc non Af Amer: 54 mL/min/{1.73_m2} — ABNORMAL LOW (ref >59)
Glucose: 124 mg/dL — ABNORMAL HIGH (ref 65–99)
Potassium: 4.6 mmol/L (ref 3.5–5.2)
Sodium: 143 mmol/L (ref 134–144)

## 2019-12-20 LAB — HEPATIC FUNCTION PANEL
ALT: 20 IU/L (ref 0–32)
AST: 19 IU/L (ref 0–40)
Albumin: 4.6 g/dL (ref 3.8–4.9)
Alkaline Phosphatase: 96 IU/L (ref 39–117)
Bilirubin Total: 0.3 mg/dL (ref 0.0–1.2)
Bilirubin, Direct: 0.1 mg/dL (ref 0.00–0.40)
Total Protein: 7.5 g/dL (ref 6.0–8.5)

## 2019-12-20 LAB — HGB A1C W/O EAG: Hgb A1c MFr Bld: 6.3 % — ABNORMAL HIGH (ref 4.8–5.6)

## 2019-12-20 LAB — MICROALBUMIN / CREATININE URINE RATIO
Creatinine, Urine: 211.7 mg/dL
Microalb/Creat Ratio: 5 mg/g creat (ref 0–29)
Microalbumin, Urine: 10.9 ug/mL

## 2019-12-20 LAB — LIPID PANEL
Chol/HDL Ratio: 3.3 ratio (ref 0.0–4.4)
Cholesterol, Total: 238 mg/dL — ABNORMAL HIGH (ref 100–199)
HDL: 72 mg/dL (ref >39)
LDL Chol Calc (NIH): 148 mg/dL — ABNORMAL HIGH (ref 0–99)
Triglycerides: 103 mg/dL (ref 0–149)
VLDL Cholesterol Cal: 18 mg/dL (ref 5–40)

## 2019-12-20 LAB — TSH: TSH: 1.13 u[IU]/mL (ref 0.450–4.500)

## 2019-12-21 ENCOUNTER — Telehealth: Payer: Self-pay | Admitting: Internal Medicine

## 2019-12-21 DIAGNOSIS — R944 Abnormal results of kidney function studies: Secondary | ICD-10-CM

## 2019-12-21 NOTE — Telephone Encounter (Signed)
-----   Message from Charlynne Pander, RN sent at 12/20/2019  3:10 PM EST ----- Lab results

## 2019-12-21 NOTE — Telephone Encounter (Signed)
Notify pt that I reviewed her outside labs.  Her cholesterol is elevated.  It is recommended for her to start a cholesterol medication.  If agreeable, would like to start crestor 10mg  q day.  Will need liver panel checked 6 weeks after starting crestor.  Overall sugar control improved some when compared to last check. Platelet count stable.  Still slightly decreased but stable.  Hgb, thyroid test and liver function tests are wnl.  Kidney function slightly decreased.  Stay hydrated. Recheck non fasting lab in 2-3 weeks.

## 2019-12-22 NOTE — Telephone Encounter (Signed)
Called pt, unable to leave message.

## 2019-12-25 NOTE — Telephone Encounter (Signed)
Patient aware of results. Will start crestor. Stated that she has crestor at home but never started it. Will start taking tomorrow [facility].

## 2019-12-26 ENCOUNTER — Ambulatory Visit
Admission: RE | Admit: 2019-12-26 | Discharge: 2019-12-26 | Disposition: A | Discharge status: Home / Self Care | Payer: Managed Care, Other (non HMO) | Source: Ambulatory Visit | Attending: Internal Medicine | Admitting: Internal Medicine

## 2019-12-26 DIAGNOSIS — Z1231 Encounter for screening mammogram for malignant neoplasm of breast: Secondary | ICD-10-CM | POA: Insufficient documentation

## 2020-01-01 ENCOUNTER — Encounter: Payer: Self-pay | Admitting: Internal Medicine

## 2020-02-16 ENCOUNTER — Other Ambulatory Visit: Payer: Self-pay | Admitting: Internal Medicine

## 2020-03-11 ENCOUNTER — Other Ambulatory Visit: Payer: Self-pay

## 2020-03-11 ENCOUNTER — Other Ambulatory Visit: Payer: Self-pay | Admitting: Internal Medicine

## 2020-03-12 ENCOUNTER — Ambulatory Visit (INDEPENDENT_AMBULATORY_CARE_PROVIDER_SITE_OTHER): Payer: Managed Care, Other (non HMO) | Admitting: Internal Medicine

## 2020-03-12 DIAGNOSIS — D696 Thrombocytopenia, unspecified: Secondary | ICD-10-CM | POA: Diagnosis not present

## 2020-03-12 DIAGNOSIS — E7800 Pure hypercholesterolemia, unspecified: Secondary | ICD-10-CM | POA: Diagnosis not present

## 2020-03-12 DIAGNOSIS — E119 Type 2 diabetes mellitus without complications: Secondary | ICD-10-CM

## 2020-03-12 DIAGNOSIS — R739 Hyperglycemia, unspecified: Secondary | ICD-10-CM | POA: Diagnosis not present

## 2020-03-12 DIAGNOSIS — Z86010 Personal history of colonic polyps: Secondary | ICD-10-CM

## 2020-03-12 DIAGNOSIS — I10 Essential (primary) hypertension: Secondary | ICD-10-CM

## 2020-03-12 DIAGNOSIS — R0683 Snoring: Secondary | ICD-10-CM

## 2020-03-12 DIAGNOSIS — Z6841 Body Mass Index (BMI) 40.0 and over, adult: Secondary | ICD-10-CM

## 2020-03-12 MED ORDER — ROSUVASTATIN CALCIUM 10 MG PO TABS: 10.0000 mg | ORAL_TABLET | Freq: Every day | ORAL | 1 refills | Status: DC

## 2020-03-12 NOTE — Progress Notes (Signed)
Patient ID: DOLORA RIDGELY, female   DOB: March 12, 1961, 59 y.o.   MRN: [id number]   Subjective:    Patient ID: [name], female    DOB: 1961/07/05, 59 y.o.   MRN: [id number]  HPI This visit occurred during the SARS-CoV-2 public health emergency.  Safety protocols were in place, including screening questions prior to the visit, additional usage of staff PPE, and extensive cleaning of exam room while observing appropriate contact time as indicated for disinfecting solutions.  Patient here for a scheduled follow up.  Reports she is doing relatively well.  Still dealing with increased stress - dealing with her brother's death.  Has good family support.  Grief counseling.  Increased stress with work, Social research officer, government.  Does not feel she needs any further intervention at this time.  Sister reports she snores (loud snoring).  Also question of episodes of not breathing when sleeping. Discussed possible sleep apnea.  She is agreeable to referral to pulmonary.  No chest pain or sob reported.  No abdominal pain or bowel change reported.  Due colonoscopy.  Blood pressures averaging 124/70s.    Past Medical History:  Diagnosis Date  . Asymptomatic cholelithiasis   . Hypertension   . Mammographic microcalcification 2015  . Thrombocytopenia ([place])    Recent platelet count 151 on 11/01/08  . Tobacco use    Past Surgical History:  Procedure Laterality Date  . BREAST BIOPSY Left 2012   NEG  . KNEE SURGERY Left 3/11   left knee    Family History  Problem Relation Age of Onset  . Hypertension Mother   . Diabetes Mother   . Hypothyroidism Mother   . Heart disease Father        Died of a Heart Attack   . Breast cancer Neg Hx    Social History   Socioeconomic History  . Marital status: Single    Spouse name: Not on file  . Number of children: Not on file  . Years of education: Not on file  . Highest education level: Not on file  Occupational History  . Not on file  Tobacco Use  . Smoking status: Former  Smoker    Packs/day: 0.25    Years: 12.00    Pack years: 3.00    Types: Cigarettes  . Smokeless tobacco: Never Used  . Tobacco comment: Quit March 2016  Substance and Sexual Activity  . Alcohol use: Yes    Alcohol/week: 0.0 standard drinks    Comment: occasionally  . Drug use: No  . Sexual activity: Not on file  Other Topics Concern  . Not on file  Social History Narrative  . Not on file   Social Determinants of Health   Financial Resource Strain:   . Difficulty of Paying Living Expenses:   Food Insecurity:   . Worried About Charity fundraiser in the Last Year:   . Arboriculturist in the Last Year:   Transportation Needs:   . Film/video editor (Medical):   [name] Lack of Transportation (Non-Medical):   Physical Activity:   . Days of Exercise per Week:   . Minutes of Exercise per Session:   Stress:   . Feeling of Stress :   Social Connections:   . Frequency of Communication with Friends and Family:   . Frequency of Social Gatherings with Friends and Family:   . Attends Religious Services:   . Active Member of Clubs or Organizations:   . Attends Archivist  Meetings:   . Marital Status:     Outpatient Encounter Medications as of 03/12/2020  Medication Sig  . ascorbic acid (VITAMIN C) 250 MG CHEW Chew 250 mg by mouth daily.  . Black Currant Seed Oil 500 MG CAPS Take 1 capsule by mouth daily.  . cyanocobalamin 500 MCG tablet Take 500 mcg by mouth daily.  . KLOR-CON M10 10 MEQ tablet TAKE 1 TABLET BY MOUTH EVERY DAY  . Omega-3 Fatty Acids (FISH OIL) 1000 MG CAPS Take 2 capsules by mouth daily.  . Pediatric Multiple Vitamins (CHEWABLE MULTIPLE VITAMINS PO) Take 1 tablet by mouth daily.  . rosuvastatin (CRESTOR) 10 MG tablet Take 1 tablet (10 mg total) by mouth daily.  . triamterene-hydrochlorothiazide (MAXZIDE-25) 37.5-25 MG tablet TAKE 1 TABLET BY MOUTH EVERY DAY  . [DISCONTINUED] escitalopram (LEXAPRO) 10 MG tablet TAKE 1 TABLET BY MOUTH EVERY DAY  .  [DISCONTINUED] potassium chloride (K-DUR) 10 MEQ tablet TAKE 1 TABLET EVERY DAY   No facility-administered encounter medications on file as of 03/12/2020.    Review of Systems  Constitutional: Negative for appetite change and unexpected weight change.  HENT: Negative for congestion and sinus pressure.   Respiratory: Negative for cough, chest tightness and shortness of breath.   Cardiovascular: Negative for chest pain, palpitations and leg swelling.  Gastrointestinal: Negative for abdominal pain, diarrhea, nausea and vomiting.  Genitourinary: Negative for difficulty urinating and dysuria.  Musculoskeletal: Negative for joint swelling and myalgias.  Skin: Negative for color change and rash.  Neurological: Negative for dizziness, light-headedness and headaches.  Psychiatric/Behavioral: Negative for agitation and dysphoric mood.       Objective:    Physical Exam Vitals reviewed.  Constitutional:      General: She is not in acute distress.    Appearance: Normal appearance.  HENT:     Head: Normocephalic and atraumatic.     Right Ear: External ear normal.     Left Ear: External ear normal.  Eyes:     General: No scleral icterus.       Right eye: No discharge.        Left eye: No discharge.     Conjunctiva/sclera: Conjunctivae normal.  Neck:     Thyroid: No thyromegaly.  Cardiovascular:     Rate and Rhythm: Normal rate and regular rhythm.  Pulmonary:     Effort: No respiratory distress.     Breath sounds: Normal breath sounds. No wheezing.  Abdominal:     General: Bowel sounds are normal.     Palpations: Abdomen is soft.     Tenderness: There is no abdominal tenderness.  Musculoskeletal:        General: No swelling or tenderness.     Cervical back: Neck supple. No tenderness.  Lymphadenopathy:     Cervical: No cervical adenopathy.  Skin:    Findings: No erythema or rash.  Neurological:     Mental Status: She is alert.  Psychiatric:        Mood and Affect: Mood normal.         Behavior: Behavior normal.     BP 124/76   Pulse 82   Temp (!) 97.5 F (36.4 C)   Resp 16   Ht 5' 10" (1.778 m)   Wt (!) 419 lb (190.1 kg)   LMP 12/29/2012   SpO2 98%   BMI 60.12 kg/m  Wt Readings from Last 3 Encounters:  03/12/20 (!) 419 lb (190.1 kg)  11/07/19 (!) 410 lb (186 kg)  09/18/19 (!)   410 lb 12.8 oz (186.3 kg)     Lab Results  Component Value Date   WBC 6.1 12/19/2019   HGB 13.9 12/19/2019   HCT 40.8 12/19/2019   PLT 137 (L) 12/19/2019   GLUCOSE 124 (H) 12/19/2019   CHOL 238 (H) 12/19/2019   TRIG 103 12/19/2019   HDL 72 12/19/2019   LDLDIRECT 140.7 07/13/2013   LDLCALC 148 (H) 12/19/2019   ALT 20 12/19/2019   AST 19 12/19/2019   NA 143 12/19/2019   K 4.6 12/19/2019   CL 103 12/19/2019   CREATININE 1.13 (H) 12/19/2019   BUN 23 12/19/2019   CO2 25 12/19/2019   TSH 1.130 12/19/2019   HGBA1C 6.3 (H) 12/19/2019   MICROALBUR <0.7 11/03/2018    MM 3D SCREEN BREAST BILATERAL  Result Date: 12/26/2019 CLINICAL DATA:  Screening. EXAM: DIGITAL SCREENING BILATERAL MAMMOGRAM WITH TOMO AND CAD COMPARISON:  Previous exam(s). ACR Breast Density Category a: The breast tissue is almost entirely fatty. FINDINGS: There are no findings suspicious for malignancy. Images were processed with CAD. IMPRESSION: No mammographic evidence of malignancy. A result letter of this screening mammogram will be mailed directly to the patient. RECOMMENDATION: Screening mammogram in one year. (Code:SM-B-01Y) BI-RADS CATEGORY  1: Negative. Electronically Signed   By: Everlean Alstrom M.D.   On: 12/26/2019 16:37       Assessment & Plan:   Problem List Items Addressed This Visit    Diabetes mellitus without complication ([place])    Low carb diet and exercise.  On no medication.  Follow met b and a1c.        Relevant Medications   rosuvastatin (CRESTOR) 10 MG tablet   Essential hypertension, benign    Continue triam/hctz.  Follow pressures.  Follow metabolic panel.       Relevant  Medications   rosuvastatin (CRESTOR) 10 MG tablet   Other Relevant Orders   Basic metabolic panel   History of colonic polyps    Due f/u colonoscopy.  Refer to GI.        Relevant Orders   Ambulatory referral to Gastroenterology   Hypercholesteremia    Crestor.  Low cholesterol diet and exercise.  Follow lipid panel and liver function tests.        Relevant Medications   rosuvastatin (CRESTOR) 10 MG tablet   Other Relevant Orders   Hepatic function panel   Hyperglycemia    Low carb diet and exercise.  Follow met b and a1c.       Morbid obesity with BMI of 60.0-69.9, adult ([place])    Discussed diet and exercise.  Follow.        Snoring    Loud snoring and question of apneic episodes.  Refer to pulmonary for further evaluation of question of sleep apnea.        Relevant Orders   Ambulatory referral to Pulmonology   Thrombocytopenia ([place])    Platelet count slightly decreased.  Recheck cbc with next labs.            Einar Pheasant, MD

## 2020-03-17 ENCOUNTER — Encounter: Payer: Self-pay | Admitting: Internal Medicine

## 2020-03-17 DIAGNOSIS — R0683 Snoring: Secondary | ICD-10-CM | POA: Insufficient documentation

## 2020-03-17 NOTE — Assessment & Plan Note (Signed)
Loud snoring and question of apneic episodes.  Refer to pulmonary for further evaluation of question of sleep apnea.

## 2020-03-17 NOTE — Assessment & Plan Note (Signed)
Discussed diet and exercise.  Follow.  

## 2020-03-17 NOTE — Assessment & Plan Note (Signed)
Low carb diet and exercise.  Follow met b and a1c.  

## 2020-03-17 NOTE — Assessment & Plan Note (Signed)
Low carb diet and exercise.  On no medication.  Follow met b and a1c.   

## 2020-03-17 NOTE — Assessment & Plan Note (Signed)
Continue triam/hctz.  Follow pressures.  Follow metabolic panel.  

## 2020-03-17 NOTE — Assessment & Plan Note (Signed)
Platelet count slightly decreased.  Recheck cbc with next labs.

## 2020-03-17 NOTE — Assessment & Plan Note (Signed)
Due f/u colonoscopy.  Refer to GI.  

## 2020-03-17 NOTE — Assessment & Plan Note (Addendum)
Crestor. Low cholesterol diet and exercise.  Follow lipid panel and liver function tests.  

## 2020-03-18 ENCOUNTER — Telehealth: Payer: Self-pay | Admitting: Internal Medicine

## 2020-03-18 DIAGNOSIS — Z1211 Encounter for screening for malignant neoplasm of colon: Secondary | ICD-10-CM

## 2020-03-18 DIAGNOSIS — Z860100 Personal history of colon polyps, unspecified: Secondary | ICD-10-CM

## 2020-03-18 NOTE — Telephone Encounter (Signed)
Rejection Reason - Other - Pt was originally scheduled to see [facility] on 01/02/20, but the pt cancelled appt: "cx per pt -- [facility] is out of network with her insurance"  Pt has the same insurance. " [facility] said about 4 hours ago

## 2020-03-18 NOTE — Telephone Encounter (Signed)
Is [facility] in her network (Dr Tobi Bastos, Dr Servando Snare, Dr Allegra Lai and Dr Hazel Sams)?  If so, then do I need to place an order for new referral.

## 2020-03-20 NOTE — Addendum Note (Signed)
Addended by: Charm Barges on: 03/20/2020 09:09 PM   Modules accepted: Orders

## 2020-03-20 NOTE — Telephone Encounter (Signed)
I have placed the order for GI referral to [facility].

## 2020-03-20 NOTE — Telephone Encounter (Signed)
Can you place referral for [facility]? Thank you!

## 2020-03-20 NOTE — Telephone Encounter (Signed)
Order placed for GI referral.   

## 2020-03-28 NOTE — Telephone Encounter (Signed)
Thank you :)

## 2020-03-28 NOTE — Telephone Encounter (Signed)
Ok Thank you! Referral was sent on 05/06

## 2020-03-29 NOTE — Telephone Encounter (Signed)
y w

## 2020-05-24 ENCOUNTER — Telehealth: Payer: Self-pay | Admitting: Internal Medicine

## 2020-05-24 NOTE — Telephone Encounter (Signed)
I have been unable to reach this patient by phone.  A letter is being sent.  Note from [facility]

## 2020-05-24 NOTE — Telephone Encounter (Signed)
This was sent to me, but there is no message attached that I can see.  Did I need to do something with this.  Thanks.

## 2020-06-13 ENCOUNTER — Telehealth: Payer: Self-pay | Admitting: Internal Medicine

## 2020-06-13 NOTE — Telephone Encounter (Signed)
Rejection Reason - Patient did not respond" °[facility] said 1 day ago °

## 2020-07-16 ENCOUNTER — Other Ambulatory Visit: Payer: Self-pay

## 2020-07-16 ENCOUNTER — Encounter: Payer: Self-pay | Admitting: Internal Medicine

## 2020-07-16 ENCOUNTER — Telehealth (INDEPENDENT_AMBULATORY_CARE_PROVIDER_SITE_OTHER): Payer: Managed Care, Other (non HMO) | Admitting: Internal Medicine

## 2020-07-16 DIAGNOSIS — R739 Hyperglycemia, unspecified: Secondary | ICD-10-CM | POA: Diagnosis not present

## 2020-07-16 DIAGNOSIS — D696 Thrombocytopenia, unspecified: Secondary | ICD-10-CM | POA: Diagnosis not present

## 2020-07-16 DIAGNOSIS — E119 Type 2 diabetes mellitus without complications: Secondary | ICD-10-CM

## 2020-07-16 DIAGNOSIS — E7800 Pure hypercholesterolemia, unspecified: Secondary | ICD-10-CM

## 2020-07-16 DIAGNOSIS — Z86010 Personal history of colonic polyps: Secondary | ICD-10-CM

## 2020-07-16 DIAGNOSIS — Z1211 Encounter for screening for malignant neoplasm of colon: Secondary | ICD-10-CM

## 2020-07-16 DIAGNOSIS — I10 Essential (primary) hypertension: Secondary | ICD-10-CM

## 2020-07-16 NOTE — Progress Notes (Deleted)
Patient ID: Morgan Duncan, female   DOB: [birth year], 59 y.o.   MRN: [id number]   Subjective:    Patient ID: Morgan Duncan, female    DOB: 10/20/1961, 59 y.o.   MRN: [id number]  HPI This visit occurred during the SARS-CoV-2 public health emergency.  Safety protocols were in place, including screening questions prior to the visit, additional usage of staff PPE, and extensive cleaning of exam room while observing appropriate contact time as indicated for disinfecting solutions.  Patient here for a scheduled follow up.   Past Medical History:  Diagnosis Date  . Asymptomatic cholelithiasis   . Hypertension   . Mammographic microcalcification 2015  . Thrombocytopenia ([facility])    Recent platelet count 151 on 11/01/08  . Tobacco use    Past Surgical History:  Procedure Laterality Date  . BREAST BIOPSY Left 2012   NEG  . KNEE SURGERY Left 3/11   left knee    Family History  Problem Relation Age of Onset  . Hypertension Mother   . Diabetes Mother   . Hypothyroidism Mother   . Heart disease Father        Died of a Heart Attack   . Breast cancer Neg Hx    Social History   Socioeconomic History  . Marital status: Single    Spouse name: Not on file  . Number of children: Not on file  . Years of education: Not on file  . Highest education level: Not on file  Occupational History  . Not on file  Tobacco Use  . Smoking status: Former Smoker    Packs/day: 0.25    Years: 12.00    Pack years: 3.00    Types: Cigarettes  . Smokeless tobacco: Never Used  . Tobacco comment: Quit March 2016  Substance and Sexual Activity  . Alcohol use: Yes    Alcohol/week: 0.0 standard drinks    Comment: occasionally  . Drug use: No  . Sexual activity: Not on file  Other Topics Concern  . Not on file  Social History Narrative  . Not on file   Social Determinants of Health   Financial Resource Strain:   . Difficulty of Paying Living Expenses: Not on file  Food Insecurity:   . Worried About  Programme researcher, broadcasting/film/video in the Last Year: Not on file  . Ran Out of Food in the Last Year: Not on file  Transportation Needs:   . Lack of Transportation (Medical): Not on file  . Lack of Transportation (Non-Medical): Not on file  Physical Activity:   . Days of Exercise per Week: Not on file  . Minutes of Exercise per Session: Not on file  Stress:   . Feeling of Stress : Not on file  Social Connections:   . Frequency of Communication with Friends and Family: Not on file  . Frequency of Social Gatherings with Friends and Family: Not on file  . Attends Religious Services: Not on file  . Active Member of Clubs or Organizations: Not on file  . Attends Banker Meetings: Not on file  . Marital Status: Not on file    Outpatient Encounter Medications as of 07/16/2020  Medication Sig  . ascorbic acid (VITAMIN C) 250 MG CHEW Chew 250 mg by mouth daily.  . Black Currant Seed Oil 500 MG CAPS Take 1 capsule by mouth daily.  . cyanocobalamin 500 MCG tablet Take 500 mcg by mouth daily.  [name] KLOR-CON M10 10 MEQ tablet TAKE 1  TABLET BY MOUTH EVERY DAY  . Omega-3 Fatty Acids (FISH OIL) 1000 MG CAPS Take 2 capsules by mouth daily.  . Pediatric Multiple Vitamins (CHEWABLE MULTIPLE VITAMINS PO) Take 1 tablet by mouth daily.  . rosuvastatin (CRESTOR) 10 MG tablet Take 1 tablet (10 mg total) by mouth daily.  [name] triamterene-hydrochlorothiazide (MAXZIDE-25) 37.5-25 MG tablet TAKE 1 TABLET BY MOUTH EVERY DAY  . [DISCONTINUED] potassium chloride (K-DUR) 10 MEQ tablet TAKE 1 TABLET EVERY DAY   No facility-administered encounter medications on file as of 07/16/2020.    Review of Systems     Objective:    Physical Exam  LMP 12/29/2012  Wt Readings from Last 3 Encounters:  03/12/20 (!) 419 lb (190.1 kg)  11/07/19 (!) 410 lb (186 kg)  09/18/19 (!) 410 lb 12.8 oz (186.3 kg)     Lab Results  Component Value Date   WBC 6.1 12/19/2019   HGB 13.9 12/19/2019   HCT 40.8 12/19/2019   PLT 137 (L)  12/19/2019   GLUCOSE 124 (H) 12/19/2019   CHOL 238 (H) 12/19/2019   TRIG 103 12/19/2019   HDL 72 12/19/2019   LDLDIRECT 140.7 07/13/2013   LDLCALC 148 (H) 12/19/2019   ALT 20 12/19/2019   AST 19 12/19/2019   NA 143 12/19/2019   K 4.6 12/19/2019   CL 103 12/19/2019   CREATININE 1.13 (H) 12/19/2019   BUN 23 12/19/2019   CO2 25 12/19/2019   TSH 1.130 12/19/2019   HGBA1C 6.3 (H) 12/19/2019   MICROALBUR <0.7 11/03/2018    MM 3D SCREEN BREAST BILATERAL  Result Date: 12/26/2019 CLINICAL DATA:  Screening. EXAM: DIGITAL SCREENING BILATERAL MAMMOGRAM WITH TOMO AND CAD COMPARISON:  Previous exam(s). ACR Breast Density Category a: The breast tissue is almost entirely fatty. FINDINGS: There are no findings suspicious for malignancy. Images were processed with CAD. IMPRESSION: No mammographic evidence of malignancy. A result letter of this screening mammogram will be mailed directly to the patient. RECOMMENDATION: Screening mammogram in one year. (Code:SM-B-01Y) BI-RADS CATEGORY  1: Negative. Electronically Signed   By: Edwin Cap M.D.   On: 12/26/2019 16:37       Assessment & Plan:   Problem List Items Addressed This Visit    Thrombocytopenia (HCC) - Primary   Relevant Orders   CBC with Differential/Platelet   Hyperglycemia   Hypercholesteremia   Relevant Orders   Hepatic function panel   Lipid panel   Diabetes mellitus without complication (HCC)   Relevant Orders   Hemoglobin A1c   Basic metabolic panel   Microalbumin / creatinine urine ratio       Dale Marcellus, MD

## 2020-07-22 ENCOUNTER — Encounter: Payer: Self-pay | Admitting: Internal Medicine

## 2020-07-22 NOTE — Assessment & Plan Note (Signed)
On crestor.  Low cholesterol diet and exercise.  Check lipid panel and liver function tests.

## 2020-07-22 NOTE — Assessment & Plan Note (Signed)
Due f/u colonoscopy.  Refer to GI.

## 2020-07-22 NOTE — Assessment & Plan Note (Signed)
Continue triam/hctz.  Follow pressures.  Follow metabolic panel.  

## 2020-07-22 NOTE — Assessment & Plan Note (Signed)
Low carb diet and exercise.  Follow met b and a1c.   

## 2020-07-22 NOTE — Assessment & Plan Note (Signed)
Low carb diet and exercise.  Follow met b and a1c.  

## 2020-07-22 NOTE — Assessment & Plan Note (Signed)
Follow cbc.  

## 2020-07-22 NOTE — Progress Notes (Addendum)
Patient ID: Morgan Duncan, female   DOB: Sep 26, 1961, 59 y.o.   MRN: [id number]   Virtual Visit via video Note  This visit type was conducted due to national recommendations for restrictions regarding the COVID-19 pandemic (e.g. social distancing).  This format is felt to be most appropriate for this patient at this time.  All issues noted in this document were discussed and addressed.  No physical exam was performed (except for noted visual exam findings with Video Visits).   I connected with Morgan Duncan by a video enabled telemedicine application and verified that I am speaking with the correct person using two identifiers. Location patient: home Location provider: work  Persons participating in the virtual visit: patient, provider  The limitations, risks, security and privacy concerns of performing an evaluation and management service by video and the availability of in person appointments have been discussed.  It has also been discussed with the patient that there may be a patient responsible charge related to this service. The patient expressed understanding and agreed to proceed.   Reason for visit: scheduled follow up.    HPI: She reports she is doing relatively well. Handling stress ok. Does not feel needs any further intervention.  Has good support.  Trying to stay active.  No chest pain or sob with increased activity or exertion.  Going to gym 2-3 days/week.  No acid reflux reported.  No abdominal pain.  Bowels moving.  Due colonoscopy. Discussed with her.  Discussed due labs.     ROS: See pertinent positives and negatives per HPI.  Past Medical History:  Diagnosis Date  . Asymptomatic cholelithiasis   . Hypertension   . Mammographic microcalcification 2015  . Thrombocytopenia ([place])    Recent platelet count 151 on 11/01/08  . Tobacco use     Past Surgical History:  Procedure Laterality Date  . BREAST BIOPSY Left 2012   NEG  . KNEE SURGERY Left 3/11   left knee      Family History  Problem Relation Age of Onset  . Hypertension Mother   . Diabetes Mother   . Hypothyroidism Mother   . Heart disease Father        Died of a Heart Attack   . Breast cancer Neg Hx     SOCIAL HX: reviewed.    Current Outpatient Medications:  .  ascorbic acid (VITAMIN C) 250 MG CHEW, Chew 250 mg by mouth daily., Disp: , Rfl:  .  Black Currant Seed Oil 500 MG CAPS, Take 1 capsule by mouth daily., Disp: , Rfl:  .  cyanocobalamin 500 MCG tablet, Take 500 mcg by mouth daily., Disp: , Rfl:  .  KLOR-CON M10 10 MEQ tablet, TAKE 1 TABLET BY MOUTH EVERY DAY, Disp: 90 tablet, Rfl: 2 .  Omega-3 Fatty Acids (FISH OIL) 1000 MG CAPS, Take 2 capsules by mouth daily., Disp: , Rfl:  .  Pediatric Multiple Vitamins (CHEWABLE MULTIPLE VITAMINS PO), Take 1 tablet by mouth daily., Disp: , Rfl:  .  rosuvastatin (CRESTOR) 10 MG tablet, Take 1 tablet (10 mg total) by mouth daily., Disp: 90 tablet, Rfl: 1 .  triamterene-hydrochlorothiazide (MAXZIDE-25) 37.5-25 MG tablet, TAKE 1 TABLET BY MOUTH EVERY DAY, Disp: 90 tablet, Rfl: 1  EXAM:  GENERAL: alert, oriented, appears well and in no acute distress  HEENT: atraumatic, conjunttiva clear, no obvious abnormalities on inspection of external nose and ears  NECK: normal movements of the head and neck  LUNGS: on inspection no signs of respiratory  distress, breathing rate appears normal, no obvious gross SOB, gasping or wheezing  CV: no obvious cyanosis  PSYCH/NEURO: pleasant and cooperative, no obvious depression or anxiety, speech and thought processing grossly intact  ASSESSMENT AND PLAN:  Discussed the following assessment and plan:  Thrombocytopenia ([place]) Follow cbc.   Hyperglycemia Low carb diet and exercise.  Follow met b and a1c.   Hypercholesteremia On crestor.  Low cholesterol diet and exercise.  Check lipid panel and liver function tests.    History of colonic polyps Due f/u colonoscopy.  Refer to GI.   Essential  hypertension, benign Continue triam/hctz.  Follow pressures.  Follow metabolic panel.   Diabetes mellitus without complication (HCC) Low carb diet and exercise.  Follow met b and a1c.     Orders Placed This Encounter  Procedures  . CBC with Differential/Platelet    Standing Status:   Future    Standing Expiration Date:   07/16/2021  . Hemoglobin A1c    Standing Status:   Future    Standing Expiration Date:   07/16/2021  . Hepatic function panel    Standing Status:   Future    Standing Expiration Date:   07/16/2021  . Lipid panel    Standing Status:   Future    Standing Expiration Date:   07/16/2021  . Basic metabolic panel    Standing Status:   Future    Standing Expiration Date:   07/16/2021  . Microalbumin / creatinine urine ratio    Standing Status:   Future    Standing Expiration Date:   07/16/2021  . Ambulatory referral to Gastroenterology    Referral Priority:   Routine    Referral Type:   Consultation    Referral Reason:   Specialty Services Required    Number of Visits Requested:   1     I discussed the assessment and treatment plan with the patient. The patient was provided an opportunity to ask questions and all were answered. The patient agreed with the plan and demonstrated an understanding of the instructions.   The patient was advised to call back or seek an in-person evaluation if the symptoms worsen or if the condition fails to improve as anticipated.   Einar Pheasant, MD

## 2020-07-22 NOTE — Addendum Note (Signed)
Addended by: Charm Barges on: 07/22/2020 06:53 PM   Modules accepted: Orders

## 2020-07-26 ENCOUNTER — Other Ambulatory Visit: Payer: Managed Care, Other (non HMO)

## 2020-07-31 ENCOUNTER — Other Ambulatory Visit: Payer: Self-pay

## 2020-07-31 ENCOUNTER — Other Ambulatory Visit: Payer: Managed Care, Other (non HMO)

## 2020-07-31 ENCOUNTER — Ambulatory Visit: Payer: Managed Care, Other (non HMO) | Admitting: Physician Assistant

## 2020-07-31 VITALS — BP 130/90 | HR 73 | Temp 97.6°F | Resp 15 | Ht 69.0 in | Wt >= 6400 oz

## 2020-07-31 DIAGNOSIS — I10 Essential (primary) hypertension: Secondary | ICD-10-CM | POA: Diagnosis not present

## 2020-07-31 DIAGNOSIS — Z008 Encounter for other general examination: Secondary | ICD-10-CM | POA: Diagnosis not present

## 2020-07-31 DIAGNOSIS — E7800 Pure hypercholesterolemia, unspecified: Secondary | ICD-10-CM

## 2020-07-31 DIAGNOSIS — Z0000 Encounter for general adult medical examination without abnormal findings: Secondary | ICD-10-CM

## 2020-07-31 DIAGNOSIS — E119 Type 2 diabetes mellitus without complications: Secondary | ICD-10-CM | POA: Diagnosis not present

## 2020-07-31 NOTE — Progress Notes (Signed)
   Subjective:    Patient ID: Morgan Duncan, female    DOB: [birth year], 59 y.o.   MRN: [id number]  Pt  Is a 59 y/o female who works with the [employer]. She presents to the clinic for biometric exam. She has been with the [employer] services for 33 years. She denies any new medical problems. She is treated for hypertension, and states it has been under good control. She has not been able to exercise like usual due to COVID. She denies surgery or hospitalizations. She is up to date on immunizations. She reports receiving both COVID shots.  No problems with current medications.     Review of Systems  Constitutional: Negative for activity change, appetite change and fatigue.  HENT: Negative.   Eyes: Negative.   Respiratory: Negative.   Cardiovascular: Negative.   Gastrointestinal: Negative.   Musculoskeletal: Positive for arthralgias.  Neurological: Negative.   Psychiatric/Behavioral: Negative.        Objective:   Physical Exam Constitutional:      Appearance: Normal appearance. She is obese.  HENT:     Head: Normocephalic.     Right Ear: Tympanic membrane and ear canal normal.     Left Ear: Tympanic membrane and ear canal normal.     Mouth/Throat:     Mouth: Mucous membranes are moist.     Pharynx: Oropharynx is clear.  Eyes:     Extraocular Movements: Extraocular movements intact.     Pupils: Pupils are equal, round, and reactive to light.  Cardiovascular:     Rate and Rhythm: Normal rate and regular rhythm.     Pulses: Normal pulses.     Heart sounds: Normal heart sounds.  Pulmonary:     Effort: Pulmonary effort is normal.     Breath sounds: Normal breath sounds.  Abdominal:     General: Bowel sounds are normal.  Musculoskeletal:        General: No tenderness.     Cervical back: Normal range of motion and neck supple.  Skin:    General: Skin is warm.  Neurological:     General: No focal deficit present.     Mental Status: She is alert and oriented to person,  place, and time.  Psychiatric:        Mood and Affect: Mood normal.           Assessment & Plan:

## 2020-07-31 NOTE — Patient Instructions (Signed)
Obesity, Adult Obesity is having too much body fat. Being obese means that your weight is more than what is healthy for you. BMI is a number that explains how much body fat you have. If you have a BMI of 30 or more, you are obese. Obesity is often caused by eating or drinking more calories than your body uses. Changing your lifestyle can help you lose weight. Obesity can cause serious health problems, such as:  Stroke.  Coronary artery disease (CAD).  Type 2 diabetes.  Some types of cancer, including cancers of the colon, breast, uterus, and gallbladder.  Osteoarthritis.  High blood pressure (hypertension).  High cholesterol.  Sleep apnea.  Gallbladder stones.  Infertility problems. What are the causes?  Eating meals each day that are high in calories, sugar, and fat.  Being born with genes that may make you more likely to become obese.  Having a medical condition that causes obesity.  Taking certain medicines.  Sitting a lot (having a sedentary lifestyle).  Not getting enough sleep.  Drinking a lot of drinks that have sugar in them. What increases the risk?  Having a family history of obesity.  Being an African American woman.  Being a Hispanic man.  Living in an area with limited access to: ? Parks, recreation centers, or sidewalks. ? Healthy food choices, such as grocery stores and farmers' markets. What are the signs or symptoms? The main sign is having too much body fat. How is this treated?  Treatment for this condition often includes changing your lifestyle. Treatment may include: ? Changing your diet. This may include making a healthy meal plan. ? Exercise. This may include activity that causes your heart to beat faster (aerobic exercise) and strength training. Work with your doctor to design a program that works for you. ? Medicine to help you lose weight. This may be used if you are not able to lose 1 pound a week after 6 weeks of healthy eating and  more exercise. ? Treating conditions that cause the obesity. ? Surgery. Options may include gastric banding and gastric bypass. This may be done if:  Other treatments have not helped to improve your condition.  You have a BMI of 40 or higher.  You have life-threatening health problems related to obesity. Follow these instructions at home: Eating and drinking   Follow advice from your doctor about what to eat and drink. Your doctor may tell you to: ? Limit fast food, sweets, and processed snack foods. ? Choose low-fat options. For example, choose low-fat milk instead of whole milk. ? Eat 5 or more servings of fruits or vegetables each day. ? Eat at home more often. This gives you more control over what you eat. ? Choose healthy foods when you eat out. ? Learn to read food labels. This will help you learn how much food is in 1 serving. ? Keep low-fat snacks available. ? Avoid drinks that have a lot of sugar in them. These include soda, fruit juice, iced tea with sugar, and flavored milk.  Drink enough water to keep your pee (urine) pale yellow.  Do not go on fad diets. Physical activity  Exercise often, as told by your doctor. Most adults should get up to 150 minutes of moderate-intensity exercise every week.Ask your doctor: ? What types of exercise are safe for you. ? How often you should exercise.  Warm up and stretch before being active.  Do slow stretching after being active (cool down).  Rest between   times of being active. Lifestyle  Work with your doctor and a food expert (dietitian) to set a weight-loss goal that is best for you.  Limit your screen time.  Find ways to reward yourself that do not involve food.  Do not drink alcohol if: ? Your doctor tells you not to drink. ? You are pregnant, may be pregnant, or are planning to become pregnant.  If you drink alcohol: ? Limit how much you use to:  0-1 drink a day for women.  0-2 drinks a day for men. ? Be  aware of how much alcohol is in your drink. In the [place] one drink equals one 12 oz bottle of beer (355 mL), one 5 oz glass of wine (148 mL), or one 1 oz glass of hard liquor (44 mL). General instructions  Keep a weight-loss journal. This can help you keep track of: ? The food that you eat. ? How much exercise you get.  Take over-the-counter and prescription medicines only as told by your doctor.  Take vitamins and supplements only as told by your doctor.  Think about joining a support group.  Keep all follow-up visits as told by your doctor. This is important. Contact a doctor if:  You cannot meet your weight loss goal after you have changed your diet and lifestyle for 6 weeks. Get help right away if you:  Are having trouble breathing.  Are having thoughts of harming yourself. Summary  Obesity is having too much body fat.  Being obese means that your weight is more than what is healthy for you.  Work with your doctor to set a weight-loss goal.  Get regular exercise as told by your doctor. This information is not intended to replace advice given to you by your health care provider. Make sure you discuss any questions you have with your health care provider. Document Revised: 07/07/2018 Document Reviewed: 07/07/2018 Elsevier Patient Education  2020 [employer]. Health Maintenance, Female Adopting a healthy lifestyle and getting preventive care are important in promoting health and wellness. Ask your health care provider about:  The right schedule for you to have regular tests and exams.  Things you can do on your own to prevent diseases and keep yourself healthy. What should I know about diet, weight, and exercise? Eat a healthy diet   Eat a diet that includes plenty of vegetables, fruits, low-fat dairy products, and lean protein.  Do not eat a lot of foods that are high in solid fats, added sugars, or sodium. Maintain a healthy weight Body mass index (BMI) is used  to identify weight problems. It estimates body fat based on height and weight. Your health care provider can help determine your BMI and help you achieve or maintain a healthy weight. Get regular exercise Get regular exercise. This is one of the most important things you can do for your health. Most adults should:  Exercise for at least 150 minutes each week. The exercise should increase your heart rate and make you sweat (moderate-intensity exercise).  Do strengthening exercises at least twice a week. This is in addition to the moderate-intensity exercise.  Spend less time sitting. Even light physical activity can be beneficial. Watch cholesterol and blood lipids Have your blood tested for lipids and cholesterol at 58 years of age, then have this test every 5 years. Have your cholesterol levels checked more often if:  Your lipid or cholesterol levels are high.  You are older than 58 years of age.  You  are at high risk for heart disease. What should I know about cancer screening? Depending on your health history and family history, you may need to have cancer screening at various ages. This may include screening for:  Breast cancer.  Cervical cancer.  Colorectal cancer.  Skin cancer.  Lung cancer. What should I know about heart disease, diabetes, and high blood pressure? Blood pressure and heart disease  High blood pressure causes heart disease and increases the risk of stroke. This is more likely to develop in people who have high blood pressure readings, are of African descent, or are overweight.  Have your blood pressure checked: ? Every 3-5 years if you are 74-73 years of age. ? Every year if you are 3 years old or older. Diabetes Have regular diabetes screenings. This checks your fasting blood sugar level. Have the screening done:  Once every three years after age 27 if you are at a normal weight and have a low risk for diabetes.  More often and at a younger age if  you are overweight or have a high risk for diabetes. What should I know about preventing infection? Hepatitis B If you have a higher risk for hepatitis B, you should be screened for this virus. Talk with your health care provider to find out if you are at risk for hepatitis B infection. Hepatitis C Testing is recommended for:  Everyone born from [birth year] through 1964.  Anyone with known risk factors for hepatitis C. Sexually transmitted infections (STIs)  Get screened for STIs, including gonorrhea and chlamydia, if: ? You are sexually active and are younger than 58 years of age. ? You are older than 58 years of age and your health care provider tells you that you are at risk for this type of infection. ? Your sexual activity has changed since you were last screened, and you are at increased risk for chlamydia or gonorrhea. Ask your health care provider if you are at risk.  Ask your health care provider about whether you are at high risk for HIV. Your health care provider may recommend a prescription medicine to help prevent HIV infection. If you choose to take medicine to prevent HIV, you should first get tested for HIV. You should then be tested every 3 months for as long as you are taking the medicine. Pregnancy  If you are about to stop having your period (premenopausal) and you may become pregnant, seek counseling before you get pregnant.  Take 400 to 800 micrograms (mcg) of folic acid every day if you become pregnant.  Ask for birth control (contraception) if you want to prevent pregnancy. Osteoporosis and menopause Osteoporosis is a disease in which the bones lose minerals and strength with aging. This can result in bone fractures. If you are 23 years old or older, or if you are at risk for osteoporosis and fractures, ask your health care provider if you should:  Be screened for bone loss.  Take a calcium or vitamin D supplement to lower your risk of fractures.  Be given hormone  replacement therapy (HRT) to treat symptoms of menopause. Follow these instructions at home: Lifestyle  Do not use any products that contain nicotine or tobacco, such as cigarettes, e-cigarettes, and chewing tobacco. If you need help quitting, ask your health care provider.  Do not use street drugs.  Do not share needles.  Ask your health care provider for help if you need support or information about quitting drugs. Alcohol use  Do not  drink alcohol if: ? Your health care provider tells you not to drink. ? You are pregnant, may be pregnant, or are planning to become pregnant.  If you drink alcohol: ? Limit how much you use to 0-1 drink a day. ? Limit intake if you are breastfeeding.  Be aware of how much alcohol is in your drink. In the [place] one drink equals one 12 oz bottle of beer (355 mL), one 5 oz glass of wine (148 mL), or one 1 oz glass of hard liquor (44 mL). General instructions  Schedule regular health, dental, and eye exams.  Stay current with your vaccines.  Tell your health care provider if: ? You often feel depressed. ? You have ever been abused or do not feel safe at home. Summary  Adopting a healthy lifestyle and getting preventive care are important in promoting health and wellness.  Follow your health care provider's instructions about healthy diet, exercising, and getting tested or screened for diseases.  Follow your health care provider's instructions on monitoring your cholesterol and blood pressure. This information is not intended to replace advice given to you by your health care provider. Make sure you discuss any questions you have with your health care provider. Document Revised: 10/26/2018 Document Reviewed: 10/26/2018 Elsevier Patient Education  2020 [employer].

## 2020-08-01 LAB — HEPATIC FUNCTION PANEL
ALT: 13 IU/L (ref 0–32)
AST: 11 IU/L (ref 0–40)
Albumin: 4.4 g/dL (ref 3.8–4.9)
Alkaline Phosphatase: 84 IU/L (ref 44–121)
Bilirubin Total: 0.2 mg/dL (ref 0.0–1.2)
Bilirubin, Direct: 0.1 mg/dL (ref 0.00–0.40)
Total Protein: 7.4 g/dL (ref 6.0–8.5)

## 2020-08-01 LAB — CBC WITH DIFFERENTIAL/PLATELET
Basophils Absolute: 0 10*3/uL (ref 0.0–0.2)
Basos: 1 %
EOS (ABSOLUTE): 0.1 10*3/uL (ref 0.0–0.4)
Eos: 2 %
Hematocrit: 40.9 % (ref 34.0–46.6)
Hemoglobin: 13.4 g/dL (ref 11.1–15.9)
Immature Grans (Abs): 0 10*3/uL (ref 0.0–0.1)
Immature Granulocytes: 0 %
Lymphocytes Absolute: 1.9 10*3/uL (ref 0.7–3.1)
Lymphs: 38 %
MCH: 27.6 pg (ref 26.6–33.0)
MCHC: 32.8 g/dL (ref 31.5–35.7)
MCV: 84 fL (ref 79–97)
Monocytes Absolute: 0.4 10*3/uL (ref 0.1–0.9)
Monocytes: 7 %
Neutrophils Absolute: 2.7 10*3/uL (ref 1.4–7.0)
Neutrophils: 52 %
Platelets: 116 10*3/uL — ABNORMAL LOW (ref 150–450)
RBC: 4.85 x10E6/uL (ref 3.77–5.28)
RDW: 13.9 % (ref 11.7–15.4)
WBC: 5.1 10*3/uL (ref 3.4–10.8)

## 2020-08-01 LAB — BASIC METABOLIC PANEL
BUN/Creatinine Ratio: 24 — ABNORMAL HIGH (ref 9–23)
BUN: 22 mg/dL (ref 6–24)
CO2: 24 mmol/L (ref 20–29)
Calcium: 9.3 mg/dL (ref 8.7–10.2)
Chloride: 102 mmol/L (ref 96–106)
Creatinine, Ser: 0.93 mg/dL (ref 0.57–1.00)
GFR calc Af Amer: 78 mL/min/{1.73_m2} (ref >59)
GFR calc non Af Amer: 68 mL/min/{1.73_m2} (ref >59)
Glucose: 113 mg/dL — ABNORMAL HIGH (ref 65–99)
Potassium: 4.2 mmol/L (ref 3.5–5.2)
Sodium: 140 mmol/L (ref 134–144)

## 2020-08-01 LAB — LIPID PANEL
Chol/HDL Ratio: 2.8 ratio (ref 0.0–4.4)
Cholesterol, Total: 173 mg/dL (ref 100–199)
HDL: 61 mg/dL (ref >39)
LDL Chol Calc (NIH): 92 mg/dL (ref 0–99)
Triglycerides: 110 mg/dL (ref 0–149)
VLDL Cholesterol Cal: 20 mg/dL (ref 5–40)

## 2020-08-01 LAB — MICROALBUMIN / CREATININE URINE RATIO
Creatinine, Urine: 131.7 mg/dL
Microalb/Creat Ratio: 3 mg/g creat (ref 0–29)
Microalbumin, Urine: 4.4 ug/mL

## 2020-08-01 LAB — HGB A1C W/O EAG: Hgb A1c MFr Bld: 6.5 % — ABNORMAL HIGH (ref 4.8–5.6)

## 2020-08-06 ENCOUNTER — Encounter: Payer: Self-pay | Admitting: *Deleted

## 2020-08-07 ENCOUNTER — Telehealth: Payer: Self-pay

## 2020-08-07 ENCOUNTER — Telehealth: Payer: Self-pay | Admitting: Internal Medicine

## 2020-08-07 NOTE — Telephone Encounter (Signed)
LMTCB for lab results.  

## 2020-08-07 NOTE — Telephone Encounter (Signed)
Rejection Reason - Patient Declinedundefined" [facility] said on Aug 07, 2020 2:25 PM

## 2020-08-08 NOTE — Telephone Encounter (Signed)
See result note.  

## 2020-08-08 NOTE — Telephone Encounter (Signed)
Patient was returning call for lab results 

## 2020-08-09 NOTE — Telephone Encounter (Signed)
Patient was returning returning call for result but she wanted someone to call her and explain it so she can understand it on her terms

## 2020-08-09 NOTE — Telephone Encounter (Signed)
Discussed with pt

## 2020-08-10 ENCOUNTER — Other Ambulatory Visit: Payer: Self-pay | Admitting: Internal Medicine

## 2020-08-10 DIAGNOSIS — D696 Thrombocytopenia, unspecified: Secondary | ICD-10-CM

## 2020-08-10 NOTE — Progress Notes (Signed)
Order placed for hematology referral.  

## 2020-08-16 ENCOUNTER — Inpatient Hospital Stay: Payer: Managed Care, Other (non HMO) | Attending: Oncology | Admitting: Oncology

## 2020-08-16 ENCOUNTER — Encounter: Payer: Self-pay | Admitting: Oncology

## 2020-08-16 ENCOUNTER — Inpatient Hospital Stay: Payer: Managed Care, Other (non HMO)

## 2020-08-16 ENCOUNTER — Other Ambulatory Visit: Payer: Self-pay

## 2020-08-16 VITALS — BP 111/53 | HR 73 | Temp 97.7°F | Resp 20 | Wt >= 6400 oz

## 2020-08-16 DIAGNOSIS — Z87891 Personal history of nicotine dependence: Secondary | ICD-10-CM | POA: Diagnosis not present

## 2020-08-16 DIAGNOSIS — D696 Thrombocytopenia, unspecified: Secondary | ICD-10-CM | POA: Diagnosis present

## 2020-08-16 LAB — IRON AND TIBC
Iron: 54 ug/dL (ref 28–170)
Saturation Ratios: 14 % (ref 10.4–31.8)
TIBC: 374 ug/dL (ref 250–450)
UIBC: 320 ug/dL

## 2020-08-16 LAB — FOLATE: Folate: 8.4 ng/mL (ref >5.9)

## 2020-08-16 LAB — CBC
HCT: 38.2 % (ref 36.0–46.0)
Hemoglobin: 12.7 g/dL (ref 12.0–15.0)
MCH: 27.7 pg (ref 26.0–34.0)
MCHC: 33.2 g/dL (ref 30.0–36.0)
MCV: 83.2 fL (ref 80.0–100.0)
Platelets: 142 10*3/uL — ABNORMAL LOW (ref 150–400)
RBC: 4.59 MIL/uL (ref 3.87–5.11)
RDW: 14.3 % (ref 11.5–15.5)
WBC: 5.7 10*3/uL (ref 4.0–10.5)
nRBC: 0 % (ref 0.0–0.2)

## 2020-08-16 LAB — FERRITIN: Ferritin: 123 ng/mL (ref 11–307)

## 2020-08-16 LAB — LACTATE DEHYDROGENASE: LDH: 164 U/L (ref 98–192)

## 2020-08-16 LAB — VITAMIN B12: Vitamin B-12: 342 pg/mL (ref 180–914)

## 2020-08-16 NOTE — Progress Notes (Signed)
[place]  Telephone:(336) [phone] Fax:(336) [phone]  ID: Morgan Duncan OB: 08-10-1961  MR#: [id number]  ZYS#:[id number]  Patient Care Team: Einar Pheasant, MD as PCP - General (Internal Medicine) Bary Castilla Forest Gleason, MD (General Surgery)  CHIEF COMPLAINT: Thrombocytopenia.  INTERVAL HISTORY: Patient is a 59 year old female who was noted to have a declining platelet count on routine blood work.  She currently feels well and is asymptomatic.  She denies any new medications.  She denies any recent fevers or illnesses.  She denies any easy bleeding or bruising.  She has no neurologic complaints.  She has good appetite and denies weight loss.  She has no chest pain, shortness of breath, cough, or hemoptysis.  She denies any nausea, vomiting, constipation, or diarrhea.  She has no urinary complaints.  Patient feels at her baseline and offers no specific complaints today.  REVIEW OF SYSTEMS:   Review of Systems  Constitutional: Negative.  Negative for fever, malaise/fatigue and weight loss.  Respiratory: Negative.  Negative for cough, hemoptysis and shortness of breath.   Cardiovascular: Negative.  Negative for chest pain and leg swelling.  Gastrointestinal: Negative.  Negative for abdominal pain.  Genitourinary: Negative.  Negative for dysuria and hematuria.  Musculoskeletal: Negative for back pain.  Skin: Negative.  Negative for rash.  Neurological: Negative.  Negative for dizziness, focal weakness, weakness and headaches.  Endo/Heme/Allergies: Does not bruise/bleed easily.    As per HPI. Otherwise, a complete review of systems is negative.  PAST MEDICAL HISTORY: Past Medical History:  Diagnosis Date  . Asymptomatic cholelithiasis   . Hypertension   . Mammographic microcalcification 2015  . Thrombocytopenia ([place])    Recent platelet count 151 on 11/01/08  . Tobacco use     PAST SURGICAL HISTORY: Past Surgical History:  Procedure Laterality Date  . BREAST  BIOPSY Left 2012   NEG  . KNEE SURGERY Left 3/11   left knee     FAMILY HISTORY: Family History  Problem Relation Age of Onset  . Hypertension Mother   . Diabetes Mother   . Hypothyroidism Mother   . Heart disease Father        Died of a Heart Attack   . Breast cancer Neg Hx     ADVANCED DIRECTIVES (Y/N):  N  HEALTH MAINTENANCE: Social History   Tobacco Use  . Smoking status: Former Smoker    Packs/day: 0.25    Years: 12.00    Pack years: 3.00    Types: Cigarettes  . Smokeless tobacco: Never Used  . Tobacco comment: Quit March 2016  Substance Use Topics  . Alcohol use: Yes    Alcohol/week: 0.0 standard drinks    Comment: occasionally  . Drug use: No     Colonoscopy:  PAP:  Bone density:  Lipid panel:  No Known Allergies  Current Outpatient Medications  Medication Sig Dispense Refill  . ascorbic acid (VITAMIN C) 250 MG CHEW Chew 250 mg by mouth daily.    [name] aspirin EC 81 MG tablet Take 81 mg by mouth daily. Swallow whole.    [name] KLOR-CON M10 10 MEQ tablet TAKE 1 TABLET BY MOUTH EVERY DAY 90 tablet 2  . rosuvastatin (CRESTOR) 10 MG tablet Take 1 tablet (10 mg total) by mouth daily. 90 tablet 1  . triamterene-hydrochlorothiazide (MAXZIDE-25) 37.5-25 MG tablet TAKE 1 TABLET BY MOUTH EVERY DAY 90 tablet 1  . cyanocobalamin 500 MCG tablet Take 500 mcg by mouth daily. (Patient not taking: Reported on 08/16/2020)  No current facility-administered medications for this visit.    OBJECTIVE: Vitals:   08/16/20 1331  BP: (!) 111/53  Pulse: 73  Resp: 20  Temp: 97.7 F (36.5 C)  SpO2: 100%     Body mass index is 61.05 kg/m.    ECOG FS:0 - Asymptomatic  General: Well-developed, well-nourished, no acute distress. Eyes: Pink conjunctiva, anicteric sclera. HEENT: Normocephalic, moist mucous membranes. Lungs: No audible wheezing or coughing. Heart: Regular rate and rhythm. Abdomen: Soft, nontender, no obvious distention. Musculoskeletal: No edema, cyanosis, or  clubbing. Neuro: Alert, answering all questions appropriately. Cranial nerves grossly intact. Skin: No rashes or petechiae noted. Psych: Normal affect. Lymphatics: No cervical, calvicular, axillary or inguinal LAD.   LAB RESULTS:  Lab Results  Component Value Date   NA 140 07/31/2020   K 4.2 07/31/2020   CL 102 07/31/2020   CO2 24 07/31/2020   GLUCOSE 113 (H) 07/31/2020   BUN 22 07/31/2020   CREATININE 0.93 07/31/2020   CALCIUM 9.3 07/31/2020   PROT 7.4 07/31/2020   ALBUMIN 4.4 07/31/2020   AST 11 07/31/2020   ALT 13 07/31/2020   ALKPHOS 84 07/31/2020   BILITOT 0.2 07/31/2020   GFRNONAA 68 07/31/2020   GFRAA 78 07/31/2020    Lab Results  Component Value Date   WBC 5.7 08/16/2020   NEUTROABS 2.7 07/31/2020   HGB 12.7 08/16/2020   HCT 38.2 08/16/2020   MCV 83.2 08/16/2020   PLT 142 (L) 08/16/2020     STUDIES: No results found.  ASSESSMENT: Thrombocytopenia.  PLAN:    1.  Thrombocytopenia: Patient's platelet count initially trended down to 116, but now is improving and is nearly within normal limits at 142.  The remainder of her blood work from today is pending at time of dictation.  No intervention is needed at this time.  Patient does not require bone marrow biopsy.  Return to clinic in 3 weeks with video assisted telemedicine visit for further evaluation and discussion of her laboratory results.  I spent a total of 45 minutes reviewing chart data, face-to-face evaluation with the patient, counseling and coordination of care as detailed above.   Patient expressed understanding and was in agreement with this plan. She also understands that She can call clinic at any time with any questions, concerns, or complaints.    Lloyd Huger, MD   08/16/2020 2:17 PM

## 2020-08-18 LAB — PLATELET ANTIBODY PROFILE
Glycoprotein IV Antibody: NEGATIVE
HLA Ab Ser Ql EIA: NEGATIVE
IA/IIA Antibody: NEGATIVE
IB/IX Antibody: NEGATIVE
IIB/IIIA Antibody: NEGATIVE

## 2020-08-19 LAB — PROTEIN ELECTROPHORESIS, SERUM
A/G Ratio: 1 (ref 0.7–1.7)
Albumin ELP: 3.6 g/dL (ref 2.9–4.4)
Alpha-1-Globulin: 0.3 g/dL (ref 0.0–0.4)
Alpha-2-Globulin: 0.9 g/dL (ref 0.4–1.0)
Beta Globulin: 1.2 g/dL (ref 0.7–1.3)
Gamma Globulin: 1.3 g/dL (ref 0.4–1.8)
Globulin, Total: 3.6 g/dL (ref 2.2–3.9)
Total Protein ELP: 7.2 g/dL (ref 6.0–8.5)

## 2020-08-30 NOTE — Progress Notes (Signed)
[place]  Telephone:(336) [phone] Fax:(336) [phone]  ID: Morgan Duncan OB: 1961-10-22  MR#: [id number]  PYP#:[id number]  Patient Care Team: Einar Pheasant, MD as PCP - General (Internal Medicine) Bary Castilla Forest Gleason, MD (General Surgery)  I connected with Morgan Duncan on 09/06/20 at  1:15 PM EDT by video enabled telemedicine visit and verified that I am speaking with the correct person using two identifiers.   I discussed the limitations, risks, security and privacy concerns of performing an evaluation and management service by telemedicine and the availability of in-person appointments. I also discussed with the patient that there may be a patient responsible charge related to this service. The patient expressed understanding and agreed to proceed.   Other persons participating in the visit and their role in the encounter: Patient, MD.  Patient's location: Home. Provider's location: Clinic.  CHIEF COMPLAINT: Thrombocytopenia.  INTERVAL HISTORY: Patient agreed to video assisted telemedicine visit for further evaluation and discussion of her laboratory results.  She continues to feel well and remains asymptomatic. She denies any recent fevers or illnesses.  She denies any easy bleeding or bruising.  She has no neurologic complaints.  She has a good appetite and denies weight loss.  She has no chest pain, shortness of breath, cough, or hemoptysis.  She denies any nausea, vomiting, constipation, or diarrhea.  She has no urinary complaints.  Patient offers no specific complaints today.  REVIEW OF SYSTEMS:   Review of Systems  Constitutional: Negative.  Negative for fever, malaise/fatigue and weight loss.  Respiratory: Negative.  Negative for cough, hemoptysis and shortness of breath.   Cardiovascular: Negative.  Negative for chest pain and leg swelling.  Gastrointestinal: Negative.  Negative for abdominal pain.  Genitourinary: Negative.  Negative for dysuria and  hematuria.  Musculoskeletal: Negative for back pain.  Skin: Negative.  Negative for rash.  Neurological: Negative.  Negative for dizziness, focal weakness, weakness and headaches.  Endo/Heme/Allergies: Does not bruise/bleed easily.    As per HPI. Otherwise, a complete review of systems is negative.  PAST MEDICAL HISTORY: Past Medical History:  Diagnosis Date  . Asymptomatic cholelithiasis   . Hypertension   . Mammographic microcalcification 2015  . Thrombocytopenia ([place])    Recent platelet count 151 on 11/01/08  . Tobacco use     PAST SURGICAL HISTORY: Past Surgical History:  Procedure Laterality Date  . BREAST BIOPSY Left 2012   NEG  . KNEE SURGERY Left 3/11   left knee     FAMILY HISTORY: Family History  Problem Relation Age of Onset  . Hypertension Mother   . Diabetes Mother   . Hypothyroidism Mother   . Heart disease Father        Died of a Heart Attack   . Breast cancer Neg Hx     ADVANCED DIRECTIVES (Y/N):  N  HEALTH MAINTENANCE: Social History   Tobacco Use  . Smoking status: Former Smoker    Packs/day: 0.25    Years: 12.00    Pack years: 3.00    Types: Cigarettes  . Smokeless tobacco: Never Used  . Tobacco comment: Quit March 2016  Substance Use Topics  . Alcohol use: Yes    Alcohol/week: 0.0 standard drinks    Comment: occasionally  . Drug use: No     Colonoscopy:  PAP:  Bone density:  Lipid panel:  No Known Allergies  Current Outpatient Medications  Medication Sig Dispense Refill  . acetaminophen (TYLENOL) 500 MG tablet Take 500 mg by mouth  every 6 (six) hours as needed.    [name] ascorbic acid (VITAMIN C) 250 MG CHEW Chew 250 mg by mouth daily.    [name] aspirin EC 81 MG tablet Take 81 mg by mouth daily. Swallow whole.    [name] KLOR-CON M10 10 MEQ tablet TAKE 1 TABLET BY MOUTH EVERY DAY 90 tablet 2  . rosuvastatin (CRESTOR) 10 MG tablet Take 1 tablet (10 mg total) by mouth daily. 90 tablet 1  . triamterene-hydrochlorothiazide (MAXZIDE-25) 37.5-25  MG tablet TAKE 1 TABLET BY MOUTH EVERY DAY 90 tablet 1  . cyanocobalamin 500 MCG tablet Take 500 mcg by mouth daily. (Patient not taking: Reported on 08/16/2020)     No current facility-administered medications for this visit.    OBJECTIVE: There were no vitals filed for this visit.   There is no height or weight on file to calculate BMI.    ECOG FS:0 - Asymptomatic  General: Well-developed, well-nourished, no acute distress. HEENT: Normocephalic. Neuro: Alert, answering all questions appropriately. Cranial nerves grossly intact. Psych: Normal affect.   LAB RESULTS:  Lab Results  Component Value Date   NA 140 07/31/2020   K 4.2 07/31/2020   CL 102 07/31/2020   CO2 24 07/31/2020   GLUCOSE 113 (H) 07/31/2020   BUN 22 07/31/2020   CREATININE 0.93 07/31/2020   CALCIUM 9.3 07/31/2020   PROT 7.4 07/31/2020   ALBUMIN 4.4 07/31/2020   AST 11 07/31/2020   ALT 13 07/31/2020   ALKPHOS 84 07/31/2020   BILITOT 0.2 07/31/2020   GFRNONAA 68 07/31/2020   GFRAA 78 07/31/2020    Lab Results  Component Value Date   WBC 5.7 08/16/2020   NEUTROABS 2.7 07/31/2020   HGB 12.7 08/16/2020   HCT 38.2 08/16/2020   MCV 83.2 08/16/2020   PLT 142 (L) 08/16/2020     STUDIES: No results found.  ASSESSMENT: Thrombocytopenia.  PLAN:    1.  Thrombocytopenia: Patient's platelet count initially trended down to 116, but now is improving and is nearly within normal limits at 142.  The remainder of her blood work including iron stores, B12, folate, SPEP, and platelet antibodies are either negative or within normal limits.  No intervention is needed at this time.  Patient does not require bone marrow biopsy.  After lengthy discussion with the patient, it was agreed upon that no further follow-up is necessary.  Please monitor her CBC 1 or 2 times per year and refer patient back if platelets decrease and remain persistently below 100.    I provided 20 minutes of face-to-face video visit time during this  encounter which included chart review, counseling, and coordination of care as documented above.   Patient expressed understanding and was in agreement with this plan. She also understands that She can call clinic at any time with any questions, concerns, or complaints.    Lloyd Huger, MD   09/06/2020 1:18 PM

## 2020-09-06 ENCOUNTER — Other Ambulatory Visit: Payer: Self-pay

## 2020-09-06 ENCOUNTER — Encounter: Payer: Self-pay | Admitting: Oncology

## 2020-09-06 ENCOUNTER — Inpatient Hospital Stay (HOSPITAL_BASED_OUTPATIENT_CLINIC_OR_DEPARTMENT_OTHER): Payer: Managed Care, Other (non HMO) | Admitting: Oncology

## 2020-09-06 DIAGNOSIS — D696 Thrombocytopenia, unspecified: Secondary | ICD-10-CM

## 2020-09-06 NOTE — Progress Notes (Signed)
Patient called/ pre- screened for virtual appoinment today with oncologist. No concerns at this time.   

## 2020-09-12 ENCOUNTER — Other Ambulatory Visit: Payer: Self-pay | Admitting: Internal Medicine

## 2020-09-13 ENCOUNTER — Other Ambulatory Visit: Payer: Self-pay | Admitting: Internal Medicine

## 2020-10-25 ENCOUNTER — Encounter: Payer: Self-pay | Admitting: Internal Medicine

## 2020-10-25 ENCOUNTER — Other Ambulatory Visit: Payer: Self-pay

## 2020-10-25 ENCOUNTER — Ambulatory Visit (INDEPENDENT_AMBULATORY_CARE_PROVIDER_SITE_OTHER): Payer: Managed Care, Other (non HMO) | Admitting: Internal Medicine

## 2020-10-25 VITALS — BP 138/74 | HR 83 | Temp 98.5°F | Ht 69.02 in | Wt >= 6400 oz

## 2020-10-25 DIAGNOSIS — Z860100 Personal history of colon polyps, unspecified: Secondary | ICD-10-CM

## 2020-10-25 DIAGNOSIS — Z6841 Body Mass Index (BMI) 40.0 and over, adult: Secondary | ICD-10-CM | POA: Diagnosis not present

## 2020-10-25 DIAGNOSIS — Z23 Encounter for immunization: Secondary | ICD-10-CM | POA: Diagnosis not present

## 2020-10-25 DIAGNOSIS — Z0001 Encounter for general adult medical examination with abnormal findings: Secondary | ICD-10-CM | POA: Diagnosis not present

## 2020-10-25 DIAGNOSIS — I10 Essential (primary) hypertension: Secondary | ICD-10-CM

## 2020-10-25 DIAGNOSIS — E119 Type 2 diabetes mellitus without complications: Secondary | ICD-10-CM

## 2020-10-25 DIAGNOSIS — E7800 Pure hypercholesterolemia, unspecified: Secondary | ICD-10-CM | POA: Diagnosis not present

## 2020-10-25 DIAGNOSIS — D696 Thrombocytopenia, unspecified: Secondary | ICD-10-CM | POA: Diagnosis not present

## 2020-10-25 DIAGNOSIS — Z0000 Encounter for general adult medical examination without abnormal findings: Secondary | ICD-10-CM

## 2020-10-25 LAB — HM DIABETES FOOT EXAM

## 2020-10-25 NOTE — Progress Notes (Signed)
Patient ID: Morgan Duncan, female   DOB: [birth year], 59 y.o.   MRN: [id number]   Subjective:    Patient ID: Morgan Duncan, female    DOB: [birth year], 59 y.o.   MRN: [id number]  HPI This visit occurred during the SARS-CoV-2 public health emergency.  Safety protocols were in place, including screening questions prior to the visit, additional usage of staff PPE, and extensive cleaning of exam room while observing appropriate contact time as indicated for disinfecting solutions.  Patient here for her physical exam.  She is concerned regarding her weight.  Discussed diet and exercise. She is agreeable to see a nutritionist.  Prefers to go to [facility].  Also discussed exercise.  Discussed other treatment options including GLP-1 receptor agonists.  She does have an elevated a1c.  Discussed medication and possible side effects.  No chest pain or sob reported.  No abdominal pain or bowel change reported.  Blood pressure doing well.  Increased stress.  Still trying to cope with her brother's death.  Has contacted [employer] (through her work).    Past Medical History:  Diagnosis Date   Asymptomatic cholelithiasis    Hypertension    Mammographic microcalcification 2015   Thrombocytopenia (HCC)    Recent platelet count 151 on 11/01/08   Tobacco use    Past Surgical History:  Procedure Laterality Date   BREAST BIOPSY Left 2012   NEG   KNEE SURGERY Left 3/11   left knee    Family History  Problem Relation Age of Onset   Hypertension Mother    Diabetes Mother    Hypothyroidism Mother    Heart disease Father        Died of a Heart Attack    Breast cancer Neg Hx    Social History   Socioeconomic History   Marital status: Single    Spouse name: Not on file   Number of children: Not on file   Years of education: Not on file   Highest education level: Not on file  Occupational History   Not on file  Tobacco Use   Smoking status: Former Smoker    Packs/day: 0.25    Years: 12.00     Pack years: 3.00    Types: Cigarettes   Smokeless tobacco: Never Used   Tobacco comment: Quit March 2016  Substance and Sexual Activity   Alcohol use: Yes    Alcohol/week: 0.0 standard drinks    Comment: occasionally   Drug use: No   Sexual activity: Not on file  Other Topics Concern   Not on file  Social History Narrative   Not on file   Social Determinants of Health   Financial Resource Strain: Not on file  Food Insecurity: Not on file  Transportation Needs: Not on file  Physical Activity: Not on file  Stress: Not on file  Social Connections: Not on file    Outpatient Encounter Medications as of 10/25/2020  Medication Sig   acetaminophen (TYLENOL) 500 MG tablet Take 500 mg by mouth every 6 (six) hours as needed.   ascorbic acid (VITAMIN C) 250 MG CHEW Chew 250 mg by mouth daily.   aspirin EC 81 MG tablet Take 81 mg by mouth daily. Swallow whole.   cyanocobalamin 500 MCG tablet Take 500 mcg by mouth daily.   KLOR-CON M10 10 MEQ tablet TAKE 1 TABLET BY MOUTH EVERY DAY   rosuvastatin (CRESTOR) 10 MG tablet TAKE 1 TABLET BY MOUTH EVERY DAY   triamterene-hydrochlorothiazide (MAXZIDE-25) 37.5-25 MG  tablet TAKE 1 TABLET BY MOUTH EVERY DAY   [DISCONTINUED] potassium chloride (K-DUR) 10 MEQ tablet TAKE 1 TABLET EVERY DAY   No facility-administered encounter medications on file as of 10/25/2020.    Review of Systems  Constitutional: Negative for appetite change and unexpected weight change.  HENT: Negative for congestion, sinus pressure and sore throat.   Eyes: Negative for visual disturbance.  Respiratory: Negative for cough, chest tightness and shortness of breath.   Cardiovascular: Negative for chest pain, palpitations and leg swelling.  Gastrointestinal: Negative for abdominal pain, diarrhea, nausea and vomiting.  Genitourinary: Negative for difficulty urinating and frequency.  Musculoskeletal: Negative for joint swelling and myalgias.  Skin: Negative for  color change and rash.  Neurological: Negative for dizziness, light-headedness and headaches.  Hematological: Negative for adenopathy. Does not bruise/bleed easily.  Psychiatric/Behavioral: Negative for agitation and dysphoric mood.       Objective:    Physical Exam Vitals reviewed.  Constitutional:      General: She is not in acute distress.    Appearance: Normal appearance. She is well-developed and well-nourished.  HENT:     Head: Normocephalic and atraumatic.     Right Ear: External ear normal.     Left Ear: External ear normal.     Mouth/Throat:     Mouth: Oropharynx is clear and moist.  Eyes:     General: No scleral icterus.       Right eye: No discharge.        Left eye: No discharge.     Conjunctiva/sclera: Conjunctivae normal.  Neck:     Thyroid: No thyromegaly.  Cardiovascular:     Rate and Rhythm: Normal rate and regular rhythm.  Pulmonary:     Effort: No tachypnea, accessory muscle usage or respiratory distress.     Breath sounds: Normal breath sounds. No decreased breath sounds or wheezing.  Chest:  Breasts:     Right: No inverted nipple, mass, nipple discharge or tenderness (no axillary adenopathy).     Left: No inverted nipple, mass, nipple discharge or tenderness (no axilarry adenopathy).    Abdominal:     General: Bowel sounds are normal.     Palpations: Abdomen is soft.     Tenderness: There is no abdominal tenderness.  Musculoskeletal:        General: No swelling, tenderness or edema.     Cervical back: Neck supple. No tenderness.  Lymphadenopathy:     Cervical: No cervical adenopathy.  Skin:    Findings: No erythema or rash.  Neurological:     Mental Status: She is alert and oriented to person, place, and time.  Psychiatric:        Mood and Affect: Mood and affect and mood normal.        Behavior: Behavior normal.     BP 138/74 (BP Location: Left Arm, Patient Position: Sitting)    Pulse 83    Temp 98.5 F (36.9 C)    Ht 5' 9.02" (1.753 m)     Wt (!) 424 lb 12.8 oz (192.7 kg)    LMP 12/29/2012    SpO2 98%    BMI 62.70 kg/m  Wt Readings from Last 3 Encounters:  10/25/20 (!) 424 lb 12.8 oz (192.7 kg)  08/16/20 (!) 413 lb 6.4 oz (187.5 kg)  07/31/20 (!) 419 lb (190.1 kg)     Lab Results  Component Value Date   WBC 5.7 08/16/2020   HGB 12.7 08/16/2020   HCT 38.2 08/16/2020  PLT 142 (L) 08/16/2020   GLUCOSE 113 (H) 07/31/2020   CHOL 173 07/31/2020   TRIG 110 07/31/2020   HDL 61 07/31/2020   LDLDIRECT 140.7 07/13/2013   LDLCALC 92 07/31/2020   ALT 13 07/31/2020   AST 11 07/31/2020   NA 140 07/31/2020   K 4.2 07/31/2020   CL 102 07/31/2020   CREATININE 0.93 07/31/2020   BUN 22 07/31/2020   CO2 24 07/31/2020   TSH 1.130 12/19/2019   HGBA1C 6.5 (H) 07/31/2020   MICROALBUR <0.7 11/03/2018    MM 3D SCREEN BREAST BILATERAL  Result Date: 12/26/2019 CLINICAL DATA:  Screening. EXAM: DIGITAL SCREENING BILATERAL MAMMOGRAM WITH TOMO AND CAD COMPARISON:  Previous exam(s). ACR Breast Density Category a: The breast tissue is almost entirely fatty. FINDINGS: There are no findings suspicious for malignancy. Images were processed with CAD. IMPRESSION: No mammographic evidence of malignancy. A result letter of this screening mammogram will be mailed directly to the patient. RECOMMENDATION: Screening mammogram in one year. (Code:SM-B-01Y) BI-RADS CATEGORY  1: Negative. Electronically Signed   By: Everlean Alstrom M.D.   On: 12/26/2019 16:37       Assessment & Plan:   Problem List Items Addressed This Visit    Thrombocytopenia ([place])    Follow cbc.       Relevant Orders   CBC with Differential/Platelet   Morbid obesity with BMI of 60.0-69.9, adult ([place])    Discussed diet and exercise.  Desires to meet with a nutritionist.  [place] location.  Discussed medications - GLP 1 receptor agonists.  Agreeable to CCM referral to discuss cost effective treatment options.       Relevant Orders   AMB Referral to [facility]  Coordinaton   Referral to Nutrition and Diabetes Services   Hypercholesteremia    On crestor.  Low cholesterol diet and exercise.  Follow lipid panel and liver function tests.        Relevant Orders   Hepatic function panel   Lipid panel   History of colonic polyps    Had to cancel her scheduled colonoscopy. Plans to reschedule.       Health care maintenance    Physical today 10/25/20.  PAP 09/18/19 - negative with negative HPV.  Colonoscopy overdue. She had to cancel the scheduled colonoscopy. Plans to call and reschedule.  Mammogram 12/26/19 - Birads I.       Essential hypertension, benign    On triam/hctz.  Follow pressures.  Follow metabolic panel.       Relevant Orders   TSH   Diabetes mellitus without complication (HCC)    Low carb diet and exercise.  Follow met b and a1c. Discussed treatment - including GLP 1 receptor agonists to help with sugars and weight.  Follow.       Relevant Orders   AMB Referral to [facility] Coordinaton   Referral to Nutrition and Diabetes Services   Hemoglobin M0L   Basic metabolic panel    Other Visit Diagnoses    Encounter for general adult medical examination with abnormal findings    -  Primary   Need for immunization against influenza       Relevant Orders   Flu Vaccine QUAD 36+ mos IM (Completed)       Einar Pheasant, MD

## 2020-10-25 NOTE — Patient Instructions (Signed)
Go by and get labs drawn one week before f/u appt.

## 2020-10-26 ENCOUNTER — Encounter: Payer: Self-pay | Admitting: Internal Medicine

## 2020-10-26 NOTE — Assessment & Plan Note (Addendum)
Low carb diet and exercise.  Follow met b and a1c. Discussed treatment - including GLP 1 receptor agonists to help with sugars and weight.  Follow.

## 2020-10-26 NOTE — Assessment & Plan Note (Signed)
On crestor.  Low cholesterol diet and exercise.  Follow lipid panel and liver function tests.   

## 2020-10-26 NOTE — Assessment & Plan Note (Signed)
Physical today 10/25/20.  PAP 09/18/19 - negative with negative HPV.  Colonoscopy overdue. She had to cancel the scheduled colonoscopy. Plans to call and reschedule.  Mammogram 12/26/19 - Birads I.

## 2020-10-26 NOTE — Assessment & Plan Note (Signed)
On triam/hctz.  Follow pressures.  Follow metabolic panel. 

## 2020-10-26 NOTE — Assessment & Plan Note (Signed)
Had to cancel her scheduled colonoscopy. Plans to reschedule.

## 2020-10-26 NOTE — Assessment & Plan Note (Signed)
Discussed diet and exercise.  Desires to meet with a nutritionist.  Desires [facility] location.  Discussed medications - GLP 1 receptor agonists.  Agreeable to CCM referral to discuss cost effective treatment options.

## 2020-10-26 NOTE — Assessment & Plan Note (Signed)
Follow cbc.  

## 2020-10-28 ENCOUNTER — Telehealth: Payer: Self-pay

## 2020-10-28 NOTE — Chronic Care Management (AMB) (Addendum)
  Care Management   Note  10/28/2020 Name: [name] MRN: [id number] DOB: 1961/06/02  Morgan Duncan is a 59 y.o. year old female who is a primary care patient of Dale [place], MD. I reached out to Michaela Corner by phone today in response to a referral sent by Ms. Marilynne Drivers Hyer's PCP, Dr. Lorin Picket.    Ms. Shone was given information about care management services today including:  1. Care management services include personalized support from designated clinical staff supervised by her physician, including individualized plan of care and coordination with other care providers 2. 24/7 contact phone numbers for assistance for urgent and routine care needs. 3. The patient may stop care management services at any time by phone call to the office staff.  Patient agreed to services and verbal consent obtained.   Follow up plan: Telephone appointment with care management team member scheduled for:11/20/2020  Penne Lash, RMA Care Guide, Embedded Care Coordination [facility]  [place], [place] [address] Direct Dial: [phone] [email or web address] Website: [email or web address]

## 2020-10-29 LAB — HM DIABETES EYE EXAM

## 2020-11-13 ENCOUNTER — Telehealth: Payer: Self-pay | Admitting: Internal Medicine

## 2020-11-13 MED ORDER — TRIAMTERENE-HCTZ 37.5-25 MG PO TABS: 1.0000 | ORAL_TABLET | Freq: Every day | ORAL | 1 refills | Status: DC

## 2020-11-13 NOTE — Telephone Encounter (Signed)
Patient called in need refill for triamterene-hydrochlorothiazide (MAXZIDE-25) 37.5-25 MG tablet

## 2020-11-20 ENCOUNTER — Ambulatory Visit: Payer: Managed Care, Other (non HMO) | Admitting: Pharmacist

## 2020-11-20 DIAGNOSIS — R739 Hyperglycemia, unspecified: Secondary | ICD-10-CM

## 2020-11-20 DIAGNOSIS — I10 Essential (primary) hypertension: Secondary | ICD-10-CM

## 2020-11-20 DIAGNOSIS — E7800 Pure hypercholesterolemia, unspecified: Secondary | ICD-10-CM

## 2020-11-20 MED ORDER — SEMAGLUTIDE-WEIGHT MANAGEMENT 1.7 MG/0.75ML ~~LOC~~ SOAJ: 1.7000 mg | SUBCUTANEOUS | 0 refills | Status: DC

## 2020-11-20 MED ORDER — SEMAGLUTIDE-WEIGHT MANAGEMENT 2.4 MG/0.75ML ~~LOC~~ SOAJ: 2.4000 mg | SUBCUTANEOUS | 0 refills | Status: DC

## 2020-11-20 MED ORDER — SEMAGLUTIDE-WEIGHT MANAGEMENT 1 MG/0.5ML ~~LOC~~ SOAJ: 1.0000 mg | SUBCUTANEOUS | 0 refills | Status: DC

## 2020-11-20 MED ORDER — SEMAGLUTIDE-WEIGHT MANAGEMENT 0.25 MG/0.5ML ~~LOC~~ SOAJ: 0.2500 mg | SUBCUTANEOUS | 0 refills | Status: AC

## 2020-11-20 MED ORDER — SEMAGLUTIDE-WEIGHT MANAGEMENT 0.5 MG/0.5ML ~~LOC~~ SOAJ: 0.5000 mg | SUBCUTANEOUS | 0 refills | Status: DC

## 2020-11-20 NOTE — Patient Instructions (Signed)
Visit Information  Patient Care Plan: Medication Management    Problem Identified: Obesity, Diabetes, Hypertension, Hyperlipidemia     Long-Range Goal: Disease Progression Prevention   This Visit's Progress: On track  Priority: High  Note:   Current Barriers:  . Unable to achieve control of weight   Pharmacist Clinical Goal(s):  [name] Over the next 90 days, patient will achieve 5-10% reduction in body weight through collaboration with PharmD and provider.   Interventions: . 1:1 collaboration with Dale [place]p, MD regarding development and update of comprehensive plan of care as evidenced by provider attestation and co-signature . Inter-disciplinary care team collaboration (see longitudinal plan of care) . Comprehensive medication review performed; medication list updated in electronic medical record  Obesity, Diabetes: . A1c controlled, but unable to independently manage weight; current treatment: none  . Baseline weight: 424 lbs (BMI 62) . Current meal patterns: breakfast: generally skipping on the way to work; lunch ~ 1 pm: leftovers, sometimes take out, heavier than "it should be" because she gets very hungry by not eating breakfast; dinner ~ 7-8 pm: heavy snacks or fast food; drinks: water at work . Current exercise: none. Previously working out 3-4 days per week, but has been hesitant to return to the gym d/t immunocompromised family members . Patient has been unable to achieve 5-10% reduction in body weight. Additionally, patient has T2DM. GLP1 therapy is appropriate. Will pursue coverage for [facility] given need for greater weight management as per referral from PCP for GLP1 for weight loss. If not covered by insurance, will pursue coverage for Ozempic. Counseled on side effects, mechanism of action. Patient verbalizes understanding . Discussed to incorporate a regular breakfast, such as protein shake. Discussed incorporating healthy fruits, vegetables, lean proteins to have at home for  quick supper meals. Patient notes that nutrition referral was not covered by her insurance and she was unable to afford copay  Hypertension: . Controlled per last clinic reading; current treatment: triamterene/HCTZ 37.5/25 mg daily, potassium 10 mEq daily . Recommended to continue current regimen at this time  Hyperlipidemia: . Uncontrolled given DM + risk factors of CKD, family history of ASCVD (Father died from MI in his 21s, brother died from MI in his 54s); current treatment: rosuvastatin 10 mg daily  . Hx tobacco use, quit ~7 years ago . Antiplatelet regimen: aspirin 81 mg daily  . Moving forward, recommend increasing rosuvastatin to 20 mg daily to target goal LDL <70  Patient Goals/Self-Care Activities . Over the next 90 days, patient will:  - take medications as prescribed collaborate with provider on medication access solutions target a minimum of 150 minutes of moderate intensity exercise weekly engage in dietary modifications by incorporating healthy proteins, vegetables, fruits, reducing quick carb meals  Follow Up Plan: Telephone follow up appointment with care management team member scheduled for: ~ 4 weeks after therapy initiation.       [name] was given information about care management services today including:  1. CCM service includes personalized support from designated clinical staff supervised by her physician, including individualized plan of care and coordination with other care providers 2. 24/7 contact phone numbers for assistance for urgent and routine care needs. 3. The patient may stop CCM services at any time (effective at the end of the month) by phone call to the office staff.  Patient agreed to services and verbal consent obtained.   The patient verbalized understanding of instructions, educational materials, and care plan provided today and declined offer to receive copy of  patient instructions, educational materials, and care plan.   Plan:  Telephone follow up appointment with care management team member scheduled for:~ 4 weeks after therapy initiation  [name], PharmD, [place], CPP Clinical Pharmacist [employer] [facility] [employer] [phone]

## 2020-11-20 NOTE — Chronic Care Management (AMB) (Signed)
Care Management   Pharmacy Note  11/20/2020 Name: Morgan Duncan MRN: [id number] DOB: [birth year]  Morgan Duncan is a 60 y.o. year old female who is a primary care patient of Einar Pheasant, MD. The Care Management/Care Coordination team team was consulted for assistance with care management and care coordination needs.    Engaged with patient by telephone for initial visit in response to provider referral for pharmacy case management and/or care coordination services.   Consent to Services:  Patient was given the following information about care management and care coordination services today, agreed to services, and gave verbal consent: 1.care management/care coordination services include personalized support from designated clinical staff supervised by their physician, including individualized plan of care and coordination with other care providers 2. 24/7 contact phone numbers for assistance for urgent and routine care needs. 3. The patient may stop care management/care coordination services at any time by phone call to the office staff.  Review of patient status, including review of consultants reports, laboratory and other test data, was performed as part of comprehensive evaluation and provision of chronic care management services.   SDOH (Social Determinants of Health) assessments and interventions performed:  none needed  Objective:  Lab Results  Component Value Date   CREATININE 0.93 07/31/2020   CREATININE 1.13 (H) 12/19/2019   CREATININE 0.98 11/03/2018    Lab Results  Component Value Date   HGBA1C 6.5 (H) 07/31/2020       Component Value Date/Time   CHOL 173 07/31/2020 1000   TRIG 110 07/31/2020 1000   HDL 61 07/31/2020 1000   CHOLHDL 2.8 07/31/2020 1000   CHOLHDL 3 11/03/2018 1008   VLDL 17.6 11/03/2018 1008   LDLCALC 92 07/31/2020 1000   LDLDIRECT 140.7 07/13/2013 1138     Clinical ASCVD: No  The 10-year ASCVD risk score Mikey Bussing DC Jr., et al., 2013) is:  14.1%   Values used to calculate the score:     Age: 69 years     Sex: Female     Is Non-Hispanic African American: Yes     Diabetic: Yes     Tobacco smoker: No     Systolic Blood Pressure: 500 mmHg     Is BP treated: Yes     HDL Cholesterol: 61 mg/dL     Total Cholesterol: 173 mg/dL     BP Readings from Last 3 Encounters:  10/25/20 138/74  08/16/20 (!) 111/53  07/31/20 130/90    Care Plan  No Known Allergies  Medications Reviewed Today    Reviewed by Einar Pheasant, MD (Physician) on 10/26/20 at 2230  Med List Status: <None>  Medication Order Taking? Sig Documenting Provider Last Dose Status Informant  acetaminophen (TYLENOL) 500 MG tablet [id number] Yes Take 500 mg by mouth every 6 (six) hours as needed. [provider] Taking Active   ascorbic acid (VITAMIN C) 250 MG CHEW [id number] Yes Chew 250 mg by mouth daily. [provider] Taking Active   aspirin EC 81 MG tablet [id number] Yes Take 81 mg by mouth daily. Swallow whole. [provider] Taking Active   cyanocobalamin 500 MCG tablet [id number] Yes Take 500 mcg by mouth daily. [provider] Taking Active   KLOR-CON M10 10 MEQ tablet [id number] Yes TAKE 1 TABLET BY MOUTH EVERY DAY Einar Pheasant, MD Taking Active         Discontinued 04/16/14 [phone] (Reorder)   rosuvastatin (CRESTOR) 10 MG tablet [id number] Yes TAKE 1 TABLET BY MOUTH EVERY DAY Scott,  Westley Hummer, MD Taking Active   triamterene-hydrochlorothiazide (MAXZIDE-25) 37.5-25 MG tablet [id number] Yes TAKE 1 TABLET BY MOUTH EVERY DAY Dale [place], MD Taking Active           Patient Active Problem List   Diagnosis Date Noted  . Snoring 03/17/2020  . Diabetes mellitus without complication (HCC) 03/08/2019  . Right knee pain 07/17/2017  . Hypercholesteremia 03/26/2016  . Health care maintenance 09/14/2015  . Hyperglycemia 09/08/2015  . Morbid obesity with BMI of 60.0-69.9, adult (HCC) 09/08/2015  . Left knee pain 05/20/2014  .  Palpitations 01/20/2014  . Microcalcifications of the breast 08/17/2013  . Essential hypertension, benign 01/18/2013  . Thrombocytopenia (HCC) 01/18/2013  . History of colonic polyps 01/18/2013  . Tobacco abuse 01/18/2013    Conditions to be addressed/monitored: HTN, HLD, DM and obesity  Patient Care Plan: Medication Management    Problem Identified: Obesity, Diabetes, Hypertension, Hyperlipidemia     Long-Range Goal: Disease Progression Prevention   This Visit's Progress: On track  Priority: High  Note:   Current Barriers:  . Unable to achieve control of weight   Pharmacist Clinical Goal(s):  [name] Over the next 90 days, patient will achieve 5-10% reduction in body weight through collaboration with PharmD and provider.   Interventions: . 1:1 collaboration with Dale [place], MD regarding development and update of comprehensive plan of care as evidenced by provider attestation and co-signature . Inter-disciplinary care team collaboration (see longitudinal plan of care) . Comprehensive medication review performed; medication list updated in electronic medical record  Obesity, Diabetes: . A1c controlled, but unable to independently manage weight; current treatment: none  . Baseline weight: 424 lbs (BMI 62) . Current meal patterns: breakfast: generally skipping on the way to work; lunch ~ 1 pm: leftovers, sometimes take out, heavier than "it should be" because she gets very hungry by not eating breakfast; dinner ~ 7-8 pm: heavy snacks or fast food; drinks: water at work . Current exercise: none. Previously working out 3-4 days per week, but has been hesitant to return to the gym d/t immunocompromised family members . Patient has been unable to achieve 5-10% reduction in body weight. Additionally, patient has T2DM. GLP1 therapy is appropriate. Will pursue coverage for [facility] given need for greater weight management as per referral from PCP for GLP1 for weight loss. If not covered by  insurance, will pursue coverage for Ozempic. Counseled on side effects, mechanism of action. Patient verbalizes understanding . Discussed to incorporate a regular breakfast, such as protein shake. Discussed incorporating healthy fruits, vegetables, lean proteins to have at home for quick supper meals. Patient notes that nutrition referral was not covered by her insurance and she was unable to afford copay  Hypertension: . Controlled per last clinic reading; current treatment: triamterene/HCTZ 37.5/25 mg daily, potassium 10 mEq daily . Recommended to continue current regimen at this time  Hyperlipidemia: . Uncontrolled given DM + risk factors of CKD, family history of ASCVD (Father died from MI in his 29s, brother died from MI in his 58s); current treatment: rosuvastatin 10 mg daily  . Hx tobacco use, quit ~7 years ago . Antiplatelet regimen: aspirin 81 mg daily  . Moving forward, recommend increasing rosuvastatin to 20 mg daily to target goal LDL <70  Patient Goals/Self-Care Activities . Over the next 90 days, patient will:  - take medications as prescribed collaborate with provider on medication access solutions target a minimum of 150 minutes of moderate intensity exercise weekly engage in dietary modifications by incorporating healthy proteins,  vegetables, fruits, reducing quick carb meals  Follow Up Plan: Telephone follow up appointment with care management team member scheduled for: ~ 4 weeks after therapy initiation.       Medication Assistance: None required. Patient affirms current coverage meets needs.   Plan: Telephone follow up appointment with care management team member scheduled for:~ 4 weeks after therapy initiation  [name], PharmD, [place], CPP Clinical Pharmacist [employer] at [employer] [phone]

## 2020-11-27 ENCOUNTER — Encounter: Payer: Self-pay | Admitting: Internal Medicine

## 2020-12-31 ENCOUNTER — Encounter: Payer: Self-pay | Admitting: Internal Medicine

## 2021-01-01 ENCOUNTER — Ambulatory Visit: Payer: Managed Care, Other (non HMO) | Admitting: Pharmacist

## 2021-01-01 DIAGNOSIS — Z6841 Body Mass Index (BMI) 40.0 and over, adult: Secondary | ICD-10-CM

## 2021-01-01 DIAGNOSIS — R739 Hyperglycemia, unspecified: Secondary | ICD-10-CM

## 2021-01-01 DIAGNOSIS — I10 Essential (primary) hypertension: Secondary | ICD-10-CM

## 2021-01-01 DIAGNOSIS — E119 Type 2 diabetes mellitus without complications: Secondary | ICD-10-CM

## 2021-01-01 DIAGNOSIS — E7800 Pure hypercholesterolemia, unspecified: Secondary | ICD-10-CM

## 2021-01-01 MED ORDER — OZEMPIC (0.25 OR 0.5 MG/DOSE) 2 MG/1.5ML ~~LOC~~ SOPN: PEN_INJECTOR | SUBCUTANEOUS | 2 refills | Status: DC

## 2021-01-01 NOTE — Chronic Care Management (AMB) (Signed)
Care Management   Pharmacy Note  01/01/2021 Name: Morgan Duncan MRN: [id number] DOB: [birth year]  Subjective: Morgan Duncan is a 60 y.o. year old female who is a primary care patient of Dale [place], MD. The Care Management team was consulted for assistance with care management and care coordination needs.    Engaged with patient by telephone for follow up visit in response to provider referral for pharmacy case management and/or care coordination services.   The patient was given information about Care Management services today including:  1. Care Management services includes personalized support from designated clinical staff supervised by the patient's primary care provider, including individualized plan of care and coordination with other care providers. 2. 24/7 contact phone numbers for assistance for urgent and routine care needs. 3. The patient may stop case management services at any time by phone call to the office staff.  Patient agreed to services and consent obtained.  Assessment:  Review of patient status, including review of consultants reports, laboratory and other test data, was performed as part of comprehensive evaluation and provision of chronic care management services.   SDOH (Social Determinants of Health) assessments and interventions performed:    Objective:  Lab Results  Component Value Date   CREATININE 0.93 07/31/2020   CREATININE 1.13 (H) 12/19/2019   CREATININE 0.98 11/03/2018    Lab Results  Component Value Date   HGBA1C 6.5 (H) 07/31/2020       Component Value Date/Time   CHOL 173 07/31/2020 1000   TRIG 110 07/31/2020 1000   HDL 61 07/31/2020 1000   CHOLHDL 2.8 07/31/2020 1000   CHOLHDL 3 11/03/2018 1008   VLDL 17.6 11/03/2018 1008   LDLCALC 92 07/31/2020 1000   LDLDIRECT 140.7 07/13/2013 1138    Clinical ASCVD: No  The 10-year ASCVD risk score Denman George DC Jr., et al., 2013) is: 14.1%   Values used to calculate the score:     Age:  46 years     Sex: Female     Is Non-Hispanic African American: Yes     Diabetic: Yes     Tobacco smoker: No     Systolic Blood Pressure: 138 mmHg     Is BP treated: Yes     HDL Cholesterol: 61 mg/dL     Total Cholesterol: 173 mg/dL     BP Readings from Last 3 Encounters:  10/25/20 138/74  08/16/20 (!) 111/53  07/31/20 130/90    Care Plan  No Known Allergies  Medications Reviewed Today    Reviewed by Dale [place], MD (Physician) on 10/26/20 at 2230  Med List Status: <None>  Medication Order Taking? Sig Documenting Provider Last Dose Status Informant  acetaminophen (TYLENOL) 500 MG tablet [id number] Yes Take 500 mg by mouth every 6 (six) hours as needed. [provider] Taking Active   ascorbic acid (VITAMIN C) 250 MG CHEW [id number] Yes Chew 250 mg by mouth daily. [provider] Taking Active   aspirin EC 81 MG tablet [id number] Yes Take 81 mg by mouth daily. Swallow whole. [provider] Taking Active   cyanocobalamin 500 MCG tablet [id number] Yes Take 500 mcg by mouth daily. [provider] Taking Active   KLOR-CON M10 10 MEQ tablet [id number] Yes TAKE 1 TABLET BY MOUTH EVERY DAY Dale [place], MD Taking Active         Discontinued 04/16/14 [phone] (Reorder)   rosuvastatin (CRESTOR) 10 MG tablet [id number] Yes TAKE 1 TABLET BY MOUTH EVERY DAY Dale [place], MD Taking  Active   triamterene-hydrochlorothiazide (MAXZIDE-25) 37.5-25 MG tablet [id number] Yes TAKE 1 TABLET BY MOUTH EVERY DAY Dale [place], MD Taking Active           Patient Active Problem List   Diagnosis Date Noted  . Snoring 03/17/2020  . Diabetes mellitus without complication (HCC) 03/08/2019  . Right knee pain 07/17/2017  . Hypercholesteremia 03/26/2016  . Health care maintenance 09/14/2015  . Hyperglycemia 09/08/2015  . Morbid obesity with BMI of 60.0-69.9, adult (HCC) 09/08/2015  . Left knee pain 05/20/2014  . Palpitations 01/20/2014  . Microcalcifications of the  breast 08/17/2013  . Essential hypertension, benign 01/18/2013  . Thrombocytopenia (HCC) 01/18/2013  . History of colonic polyps 01/18/2013  . Tobacco abuse 01/18/2013    Conditions to be addressed/monitored: HLD and DMII  Care Plan : Medication Management  Updates made by Lourena Simmonds, RPH-CPP since 01/01/2021 12:00 AM    Problem: Obesity, Diabetes, Hypertension, Hyperlipidemia     Long-Range Goal: Disease Progression Prevention   Recent Progress: On track  Priority: High  Note:   Current Barriers:  . Unable to achieve control of weight   Pharmacist Clinical Goal(s):  [name] Over the next 90 days, patient will achieve 5-10% reduction in body weight through collaboration with PharmD and provider.   Interventions: . 1:1 collaboration with Dale [place], MD regarding development and update of comprehensive plan of care as evidenced by provider attestation and co-signature . Inter-disciplinary care team collaboration (see longitudinal plan of care) . Comprehensive medication review performed; medication list updated in electronic medical record  Obesity, Diabetes: . A1c controlled, but unable to independently manage weight; current treatment: none  . Baseline weight: 424 lbs (BMI 62) . Current meal patterns: breakfast: generally skipping on the way to work; lunch ~ 1 pm: leftovers, sometimes take out, heavier than "it should be" because she gets very hungry by not eating breakfast; dinner ~ 7-8 pm: heavy snacks or fast food; drinks: water at work . Current exercise: none. Previously working out 3-4 days per week, but has been hesitant to return to the gym d/t immunocompromised family members . Patient has been unable to achieve 5-10% reduction in body weight. GLP1 would be an appropriate option for this patient.  Reginal Lutes was covered by insurance, but cost was still too high, even with savings card information.  . Discussed Ozempic. Patient amenable. Start Ozempic 0.25 mg weekly  for 4 weeks, then increase to 0.5 mg weekly. Will plan to increase to 1 mg weekly after at least 4 weeks on Ozempic 0.5 mg weekly. Counseled patient on GI effects including increased satiety, constipation, stomach upset. Encouraged adequate hydration, fiber intake if concerns with constipation. PA submitted today on Cover My Meds   Hypertension: . Controlled per last clinic reading; current treatment: triamterene/HCTZ 37.5/25 mg daily, potassium 10 mEq daily . Recommended to continue current regimen at this time  Hyperlipidemia: . Uncontrolled given DM + risk factors of CKD, family history of ASCVD (Father died from MI in his 55s, brother died from MI in his 13s); current treatment: rosuvastatin 10 mg daily  . Hx tobacco use, quit ~7 years ago . Antiplatelet regimen: aspirin 81 mg daily  . Moving forward, recommend increasing rosuvastatin to 20 mg daily to target goal LDL <70  Patient Goals/Self-Care Activities . Over the next 90 days, patient will:  - take medications as prescribed collaborate with provider on medication access solutions target a minimum of 150 minutes of moderate intensity exercise weekly engage in dietary  modifications by incorporating healthy proteins, vegetables, fruits, reducing quick carb meals  Follow Up Plan: Telephone follow up appointment with care management team member scheduled for: ~ 4 weeks after therapy initiation.       Medication Assistance:  None required.  Patient affirms current coverage meets needs.  Follow Up:  Patient agrees to Care Plan and Follow-up.  Plan: Telephone follow up appointment with care management team member scheduled for:  ~ 4-5 weeks  [name], PharmD, [place], CPP Clinical Pharmacist [employer] at [employer] [phone]

## 2021-01-01 NOTE — Patient Instructions (Signed)
Visit Information  PATIENT GOALS: Goals Addressed              This Visit's Progress     Patient Stated   .  Medication Monitoring (pt-stated)        Patient Goals/Self-Care Activities . Over the next 90 days, patient will:  - take medications as prescribed collaborate with provider on medication access solutions target a minimum of 150 minutes of moderate intensity exercise weekly engage in dietary modifications by incorporating healthy proteins, vegetables, fruits, reducing quick carb meals        Patient verbalizes understanding of instructions provided today and agrees to view in MyChart.   Plan: Telephone follow up appointment with care management team member scheduled for:  ~ 4-5 weeks  [name], PharmD, [place], CPP Clinical Pharmacist [employer] at [employer] [phone]

## 2021-01-08 ENCOUNTER — Other Ambulatory Visit: Payer: Self-pay | Admitting: Internal Medicine

## 2021-01-09 NOTE — Telephone Encounter (Signed)
Catie, see attached.  Reviewed your note.  Ozempic covered?  Not sure if this was sent by mistake.  Just let me know what I need to do.

## 2021-01-10 ENCOUNTER — Other Ambulatory Visit: Payer: Self-pay | Admitting: Internal Medicine

## 2021-01-10 DIAGNOSIS — Z1231 Encounter for screening mammogram for malignant neoplasm of breast: Secondary | ICD-10-CM

## 2021-01-14 ENCOUNTER — Ambulatory Visit: Payer: Managed Care, Other (non HMO) | Admitting: Pharmacist

## 2021-01-14 DIAGNOSIS — I10 Essential (primary) hypertension: Secondary | ICD-10-CM

## 2021-01-14 DIAGNOSIS — E7800 Pure hypercholesterolemia, unspecified: Secondary | ICD-10-CM

## 2021-01-14 DIAGNOSIS — Z6841 Body Mass Index (BMI) 40.0 and over, adult: Secondary | ICD-10-CM

## 2021-01-14 DIAGNOSIS — E119 Type 2 diabetes mellitus without complications: Secondary | ICD-10-CM

## 2021-01-14 NOTE — Progress Notes (Signed)
Will d/w Morgan Duncan at her upcoming appt regarding a trial of metformin.   Dr Lorin Picket

## 2021-01-14 NOTE — Telephone Encounter (Signed)
I think Dr Lorin Picket meant to route this to you

## 2021-01-14 NOTE — Patient Instructions (Signed)
Visit Information  Goals Addressed              This Visit's Progress     Patient Stated   .  Medication Monitoring (pt-stated)        Patient Goals/Self-Care Activities . Over the next 90 days, patient will:  - take medications as prescribed collaborate with provider on medication access solutions target a minimum of 150 minutes of moderate intensity exercise weekly engage in dietary modifications by incorporating healthy proteins, vegetables, fruits, reducing quick carb meals          The patient verbalized understanding of instructions, educational materials, and care plan provided today and declined offer to receive copy of patient instructions, educational materials, and care plan.   Plan: Telephone follow up appointment with care management team member scheduled for:  will follow for PCP decision  [name], PharmD, [place], CPP Clinical Pharmacist [employer] at [facility] [phone]

## 2021-01-14 NOTE — Chronic Care Management (AMB) (Signed)
Care Management   Pharmacy Note  01/14/2021 Name: Morgan Duncan MRN: [id number] DOB: 20-Aug-1961  Subjective: Morgan Duncan is a 60 y.o. year old female who is a primary care patient of Dale [place], MD. The Care Management team was consulted for assistance with care management and care coordination needs.    Care coordination for medication access in response to provider referral for pharmacy case management and/or care coordination services.   The patient was given information about Care Management services today including:  1. Care Management services includes personalized support from designated clinical staff supervised by the patient's primary care provider, including individualized plan of care and coordination with other care providers. 2. 24/7 contact phone numbers for assistance for urgent and routine care needs. 3. The patient may stop case management services at any time by phone call to the office staff.  Patient agreed to services and consent obtained.  Assessment:  Review of patient status, including review of consultants reports, laboratory and other test data, was performed as part of comprehensive evaluation and provision of chronic care management services.   SDOH (Social Determinants of Health) assessments and interventions performed:  none today  Objective:  Lab Results  Component Value Date   CREATININE 0.93 07/31/2020   CREATININE 1.13 (H) 12/19/2019   CREATININE 0.98 11/03/2018    Lab Results  Component Value Date   HGBA1C 6.5 (H) 07/31/2020       Component Value Date/Time   CHOL 173 07/31/2020 1000   TRIG 110 07/31/2020 1000   HDL 61 07/31/2020 1000   CHOLHDL 2.8 07/31/2020 1000   CHOLHDL 3 11/03/2018 1008   VLDL 17.6 11/03/2018 1008   LDLCALC 92 07/31/2020 1000   LDLDIRECT 140.7 07/13/2013 1138   Clinical ASCVD: No  The 10-year ASCVD risk score Denman George DC Jr., et al., 2013) is: 14.1%   Values used to calculate the score:     Age: 58  years     Sex: Female     Is Non-Hispanic African American: Yes     Diabetic: Yes     Tobacco smoker: No     Systolic Blood Pressure: 138 mmHg     Is BP treated: Yes     HDL Cholesterol: 61 mg/dL     Total Cholesterol: 173 mg/dL    BP Readings from Last 3 Encounters:  10/25/20 138/74  08/16/20 (!) 111/53  07/31/20 130/90    Care Plan  No Known Allergies  Medications Reviewed Today    Reviewed by Dale [place], MD (Physician) on 10/26/20 at 2230  Med List Status: <None>  Medication Order Taking? Sig Documenting Provider Last Dose Status Informant  acetaminophen (TYLENOL) 500 MG tablet [id number] Yes Take 500 mg by mouth every 6 (six) hours as needed. [provider] Taking Active   ascorbic acid (VITAMIN C) 250 MG CHEW [id number] Yes Chew 250 mg by mouth daily. [provider] Taking Active   aspirin EC 81 MG tablet [id number] Yes Take 81 mg by mouth daily. Swallow whole. [provider] Taking Active   cyanocobalamin 500 MCG tablet [id number] Yes Take 500 mcg by mouth daily. [provider] Taking Active   KLOR-CON M10 10 MEQ tablet [id number] Yes TAKE 1 TABLET BY MOUTH EVERY DAY Dale [place], MD Taking Active         Discontinued 04/16/14 [phone] (Reorder)   rosuvastatin (CRESTOR) 10 MG tablet [id number] Yes TAKE 1 TABLET BY MOUTH EVERY DAY Dale , MD Taking Active   triamterene-hydrochlorothiazide [facility])  37.5-25 MG tablet [id number] Yes TAKE 1 TABLET BY MOUTH EVERY DAY Dale [place], MD Taking Active           Patient Active Problem List   Diagnosis Date Noted  . Snoring 03/17/2020  . Diabetes mellitus without complication (HCC) 03/08/2019  . Right knee pain 07/17/2017  . Hypercholesteremia 03/26/2016  . Health care maintenance 09/14/2015  . Hyperglycemia 09/08/2015  . Morbid obesity with BMI of 60.0-69.9, adult (HCC) 09/08/2015  . Left knee pain 05/20/2014  . Palpitations 01/20/2014  . Microcalcifications of the breast  08/17/2013  . Essential hypertension, benign 01/18/2013  . Thrombocytopenia (HCC) 01/18/2013  . History of colonic polyps 01/18/2013  . Tobacco abuse 01/18/2013    Conditions to be addressed/monitored: HLD, DMII and Obesity  Care Plan : Medication Management  Updates made by Lourena Simmonds, RPH-CPP since 01/14/2021 12:00 AM    Problem: Obesity, Diabetes, Hypertension, Hyperlipidemia     Long-Range Goal: Disease Progression Prevention   Recent Progress: On track  Priority: High  Note:   Current Barriers:  . Unable to achieve control of weight   Pharmacist Clinical Goal(s):  [name] Over the next 90 days, patient will achieve 5-10% reduction in body weight through collaboration with PharmD and provider.   Interventions: . 1:1 collaboration with Dale [place], MD regarding development and update of comprehensive plan of care as evidenced by provider attestation and co-signature . Inter-disciplinary care team collaboration (see longitudinal plan of care) . Comprehensive medication review performed; medication list updated in electronic medical record  Obesity, Diabetes: . A1c controlled, but unable to independently manage weight; current treatment: none  . Baseline weight: 424 lbs (BMI 62) . Received notice that PA for Ozempic was denied due to patient not having tried metformin (or having a contraindication to metformin therapy).  . Patient has f/u with PCP next week. Encouraged further discussion about weight management options. Consider trial of metformin vs referral to [facility] clinic.  Hypertension: . Controlled per last clinic reading; current treatment: triamterene/HCTZ 37.5/25 mg daily, potassium 10 mEq daily . Recommended to continue current regimen at this time  Hyperlipidemia: . Uncontrolled given DM + risk factors of CKD, family history of ASCVD (Father died from MI in his 62s, brother died from MI in his 37s); current treatment: rosuvastatin 10 mg  daily  . Hx tobacco use, quit ~7 years ago . Antiplatelet regimen: aspirin 81 mg daily  . Moving forward, recommend increasing rosuvastatin to 20 mg daily to target goal LDL <70  Patient Goals/Self-Care Activities . Over the next 90 days, patient will:  - take medications as prescribed collaborate with provider on medication access solutions target a minimum of 150 minutes of moderate intensity exercise weekly engage in dietary modifications by incorporating healthy proteins, vegetables, fruits, reducing quick carb meals  Follow Up Plan: Telephone follow up appointment with care management team member scheduled for: pending discussion w/PCP      Medication Assistance:  None required.  Patient affirms current coverage meets needs.  Follow Up:  Patient agrees to Care Plan and Follow-up.  Plan: Telephone follow up appointment with care management team member scheduled for:  will follow for PCP decision  [name], PharmD, [place], CPP Clinical Pharmacist [employer] at [facility] [phone]

## 2021-01-16 ENCOUNTER — Ambulatory Visit: Payer: Managed Care, Other (non HMO) | Admitting: Pharmacist

## 2021-01-16 DIAGNOSIS — E119 Type 2 diabetes mellitus without complications: Secondary | ICD-10-CM

## 2021-01-16 DIAGNOSIS — Z6841 Body Mass Index (BMI) 40.0 and over, adult: Secondary | ICD-10-CM

## 2021-01-16 DIAGNOSIS — I10 Essential (primary) hypertension: Secondary | ICD-10-CM

## 2021-01-16 DIAGNOSIS — E7800 Pure hypercholesterolemia, unspecified: Secondary | ICD-10-CM

## 2021-01-16 NOTE — Patient Instructions (Signed)
Visit Information  Goals Addressed              This Visit's Progress     Patient Stated   .  Medication Monitoring (pt-stated)        Patient Goals/Self-Care Activities . Over the next 90 days, patient will:  - take medications as prescribed collaborate with provider on medication access solutions target a minimum of 150 minutes of moderate intensity exercise weekly engage in dietary modifications by incorporating healthy proteins, vegetables, fruits, reducing quick carb meals           The patient verbalized understanding of instructions, educational materials, and care plan provided today and declined offer to receive copy of patient instructions, educational materials, and care plan.   Plan: Telephone follow up appointment with care management team member scheduled for:  pending discussion w/ PCP at appointment next week  [name], PharmD, [place], CPP Clinical Pharmacist [employer] at [facility] [phone]

## 2021-01-16 NOTE — Chronic Care Management (AMB) (Signed)
Care Management   Pharmacy Note  01/16/2021 Name: Morgan Duncan MRN: [id number] DOB: 11-23-1960  Subjective: Morgan Duncan is a 60 y.o. year old female who is a primary care patient of Dale Solomons, MD. The Care Management team was consulted for assistance with care management and care coordination needs.    Engaged with patient by telephone for in response to a return call from her with medication management questions in response to provider referral for pharmacy case management and/or care coordination services.   The patient was given information about Care Management services today including:  1. Care Management services includes personalized support from designated clinical staff supervised by the patient's primary care provider, including individualized plan of care and coordination with other care providers. 2. 24/7 contact phone numbers for assistance for urgent and routine care needs. 3. The patient may stop case management services at any time by phone call to the office staff.  Patient agreed to services and consent obtained.  Assessment:  Review of patient status, including review of consultants reports, laboratory and other test data, was performed as part of comprehensive evaluation and provision of chronic care management services.   SDOH (Social Determinants of Health) assessments and interventions performed:    Objective:  Lab Results  Component Value Date   CREATININE 0.93 07/31/2020   CREATININE 1.13 (H) 12/19/2019   CREATININE 0.98 11/03/2018    Lab Results  Component Value Date   HGBA1C 6.5 (H) 07/31/2020       Component Value Date/Time   CHOL 173 07/31/2020 1000   TRIG 110 07/31/2020 1000   HDL 61 07/31/2020 1000   CHOLHDL 2.8 07/31/2020 1000   CHOLHDL 3 11/03/2018 1008   VLDL 17.6 11/03/2018 1008   LDLCALC 92 07/31/2020 1000   LDLDIRECT 140.7 07/13/2013 1138    Clinical ASCVD: No  The 10-year ASCVD risk score Denman George DC Jr., et al., 2013)  is: 14.1%   Values used to calculate the score:     Age: 10 years     Sex: Female     Is Non-Hispanic African American: Yes     Diabetic: Yes     Tobacco smoker: No     Systolic Blood Pressure: 138 mmHg     Is BP treated: Yes     HDL Cholesterol: 61 mg/dL     Total Cholesterol: 173 mg/dL     BP Readings from Last 3 Encounters:  10/25/20 138/74  08/16/20 (!) 111/53  07/31/20 130/90    Care Plan  No Known Allergies  Medications Reviewed Today    Reviewed by Dale G[place], MD (Physician) on 10/26/20 at 2230  Med List Status: <None>  Medication Order Taking? Sig Documenting Provider Last Dose Status Informant  acetaminophen (TYLENOL) 500 MG tablet [id number] Yes Take 500 mg by mouth every 6 (six) hours as needed. [provider] Taking Active   ascorbic acid (VITAMIN C) 250 MG CHEW [id number] Yes Chew 250 mg by mouth daily. [provider] Taking Active   aspirin EC 81 MG tablet [id number] Yes Take 81 mg by mouth daily. Swallow whole. [provider] Taking Active   cyanocobalamin 500 MCG tablet [id number] Yes Take 500 mcg by mouth daily. [provider] Taking Active   KLOR-CON M10 10 MEQ tablet [id number] Yes TAKE 1 TABLET BY MOUTH EVERY DAY Dale [place], MD Taking Active         Discontinued 04/16/14 [phone] (Reorder)   rosuvastatin (CRESTOR) 10 MG tablet [id number] Yes TAKE 1  TABLET BY MOUTH EVERY DAY Dale [place], MD Taking Active   triamterene-hydrochlorothiazide (MAXZIDE-25) 37.5-25 MG tablet [id number] Yes TAKE 1 TABLET BY MOUTH EVERY DAY Dale [place], MD Taking Active           Patient Active Problem List   Diagnosis Date Noted  . Snoring 03/17/2020  . Diabetes mellitus without complication (HCC) 03/08/2019  . Right knee pain 07/17/2017  . Hypercholesteremia 03/26/2016  . Health care maintenance 09/14/2015  . Hyperglycemia 09/08/2015  . Morbid obesity with BMI of 60.0-69.9, adult (HCC) 09/08/2015  . Left knee pain 05/20/2014   . Palpitations 01/20/2014  . Microcalcifications of the breast 08/17/2013  . Essential hypertension, benign 01/18/2013  . Thrombocytopenia (HCC) 01/18/2013  . History of colonic polyps 01/18/2013  . Tobacco abuse 01/18/2013    Conditions to be addressed/monitored: HTN, HLD, DMII and obesity  Care Plan : Medication Management  Updates made by Lourena Simmonds, RPH-CPP since 01/16/2021 12:00 AM    Problem: Obesity, Diabetes, Hypertension, Hyperlipidemia     Long-Range Goal: Disease Progression Prevention   Recent Progress: On track  Priority: High  Note:   Current Barriers:  . Unable to achieve control of weight   Pharmacist Clinical Goal(s):  [name] Over the next 90 days, patient will achieve 5-10% reduction in body weight through collaboration with PharmD and provider.   Interventions: . 1:1 collaboration with Dale [place], MD regarding development and update of comprehensive plan of care as evidenced by provider attestation and co-signature . Inter-disciplinary care team collaboration (see longitudinal plan of care) . Comprehensive medication review performed; medication list updated in electronic medical record  Obesity, Diabetes: . A1c controlled, but unable to independently manage weight; current treatment: none  . Baseline weight: 424 lbs (BMI 62) . Reviewed with patient that Ozempic requires step therapy by starting with (or trying and failing) metformin therapy. Discussed benefits, side effects of metformin therapy. Encouraged to discuss next steps (metformin vs no medication changes, considering referral to Weight Management) with PCP at visit next week, and then to let me know and we can decide our follow up plan.   Hypertension: . Controlled per last clinic reading; current treatment: triamterene/HCTZ 37.5/25 mg daily, potassium 10 mEq daily . Recommended to continue current regimen at this time  Hyperlipidemia: . Uncontrolled given DM + risk factors of CKD, family  history of ASCVD (Father died from MI in his 33s, brother died from MI in his 29s); current treatment: rosuvastatin 10 mg daily  . Hx tobacco use, quit ~7 years ago . Antiplatelet regimen: aspirin 81 mg daily  . Moving forward, recommend increasing rosuvastatin to 20 mg daily to target goal LDL <70  Patient Goals/Self-Care Activities . Over the next 90 days, patient will:  - take medications as prescribed collaborate with provider on medication access solutions target a minimum of 150 minutes of moderate intensity exercise weekly engage in dietary modifications by incorporating healthy proteins, vegetables, fruits, reducing quick carb meals  Follow Up Plan: Telephone follow up appointment with care management team member scheduled for: pending discussion w/PCP      Medication Assistance:  None required.  Patient affirms current coverage meets needs.  Follow Up:  Patient agrees to Care Plan and Follow-up.  Plan: Telephone follow up appointment with care management team member scheduled for:  pending discussion w/ PCP at appointment next week  [name], PharmD, [place], CPP Clinical Pharmacist [employer] at [facility] [phone]

## 2021-01-21 ENCOUNTER — Ambulatory Visit (INDEPENDENT_AMBULATORY_CARE_PROVIDER_SITE_OTHER): Payer: Managed Care, Other (non HMO) | Admitting: Internal Medicine

## 2021-01-21 ENCOUNTER — Other Ambulatory Visit: Payer: Self-pay

## 2021-01-21 ENCOUNTER — Encounter: Payer: Self-pay | Admitting: Internal Medicine

## 2021-01-21 DIAGNOSIS — E7800 Pure hypercholesterolemia, unspecified: Secondary | ICD-10-CM

## 2021-01-21 DIAGNOSIS — Z720 Tobacco use: Secondary | ICD-10-CM

## 2021-01-21 DIAGNOSIS — E119 Type 2 diabetes mellitus without complications: Secondary | ICD-10-CM | POA: Diagnosis not present

## 2021-01-21 DIAGNOSIS — D696 Thrombocytopenia, unspecified: Secondary | ICD-10-CM | POA: Diagnosis not present

## 2021-01-21 DIAGNOSIS — R079 Chest pain, unspecified: Secondary | ICD-10-CM

## 2021-01-21 DIAGNOSIS — Z6841 Body Mass Index (BMI) 40.0 and over, adult: Secondary | ICD-10-CM

## 2021-01-21 DIAGNOSIS — R739 Hyperglycemia, unspecified: Secondary | ICD-10-CM

## 2021-01-21 DIAGNOSIS — I10 Essential (primary) hypertension: Secondary | ICD-10-CM

## 2021-01-21 MED ORDER — METFORMIN HCL ER 500 MG PO TB24: 500.0000 mg | ORAL_TABLET | Freq: Every day | ORAL | 2 refills | Status: DC

## 2021-01-21 NOTE — Progress Notes (Signed)
Patient ID: Morgan Duncan, female   DOB: 09/07/1961, 60 y.o.   MRN: [id number]   Subjective:    Patient ID: Morgan Duncan, female    DOB: [birth year], 60 y.o.   MRN: [id number]  HPI This visit occurred during the SARS-CoV-2 public health emergency.  Safety protocols were in place, including screening questions prior to the visit, additional usage of staff PPE, and extensive cleaning of exam room while observing appropriate contact time as indicated for disinfecting solutions.  Patient here for a scheduled follow up. She is here to follow up regarding her blood sugar, cholesterol and blood pressure.  Insurance denied wegovy and ozempic.  Needs a trial of metformin before insurance will cover. Discussed starting metformin.  She is agreeable.  Increased stress.  Discussed.  She has good support.  Does not feel needs anything more at this time.  With the increased stress, she started smoking again - this time smoked cigars.  She started experiencing some chest pain.  Also notices some sob with exertion.  No cough or congestion.  Stopped smoking now.  No acid reflux reported.  No abdominal pain or bowel change reported.    Past Medical History:  Diagnosis Date  . Asymptomatic cholelithiasis   . Hypertension   . Mammographic microcalcification 2015  . Thrombocytopenia ([place])    Recent platelet count 151 on 11/01/08  . Tobacco use    Past Surgical History:  Procedure Laterality Date  . BREAST BIOPSY Left 2012   NEG  . KNEE SURGERY Left 3/11   left knee    Family History  Problem Relation Age of Onset  . Hypertension Mother   . Diabetes Mother   . Hypothyroidism Mother   . Heart disease Father        Died of a Heart Attack   . Breast cancer Neg Hx    Social History   Socioeconomic History  . Marital status: Single    Spouse name: Not on file  . Number of children: Not on file  . Years of education: Not on file  . Highest education level: Not on file  Occupational History  . Not on  file  Tobacco Use  . Smoking status: Former Smoker    Packs/day: 0.25    Years: 12.00    Pack years: 3.00    Types: Cigarettes  . Smokeless tobacco: Never Used  . Tobacco comment: Quit March 2016  Substance and Sexual Activity  . Alcohol use: Yes    Alcohol/week: 0.0 standard drinks    Comment: occasionally  . Drug use: No  . Sexual activity: Not on file  Other Topics Concern  . Not on file  Social History Narrative  . Not on file   Social Determinants of Health   Financial Resource Strain: Not on file  Food Insecurity: Not on file  Transportation Needs: Not on file  Physical Activity: Not on file  Stress: Not on file  Social Connections: Not on file    Outpatient Encounter Medications as of 01/21/2021  Medication Sig  . metFORMIN (GLUCOPHAGE XR) 500 MG 24 hr tablet Take 1 tablet (500 mg total) by mouth daily with breakfast.  . acetaminophen (TYLENOL) 500 MG tablet Take 500 mg by mouth every 6 (six) hours as needed.  [name] aspirin EC 81 MG tablet Take 81 mg by mouth daily. Swallow whole.  [name] KLOR-CON M10 10 MEQ tablet TAKE 1 TABLET BY MOUTH EVERY DAY  . rosuvastatin (CRESTOR) 10 MG tablet TAKE 1  TABLET BY MOUTH EVERY DAY  . Semaglutide,0.25 or 0.5MG /DOS, (OZEMPIC, 0.25 OR 0.5 MG/DOSE,) 2 MG/1.5ML SOPN Inject 0.25 mg weekly for 4 weeks then increase to 0.5 mg weekly thereafter (Patient not taking: Reported on 01/16/2021)  . triamterene-hydrochlorothiazide (MAXZIDE-25) 37.5-25 MG tablet Take 1 tablet by mouth daily.  . [DISCONTINUED] potassium chloride (K-DUR) 10 MEQ tablet TAKE 1 TABLET EVERY DAY   No facility-administered encounter medications on file as of 01/21/2021.    Review of Systems  Constitutional: Negative for appetite change and unexpected weight change.  HENT: Negative for congestion and sinus pressure.   Respiratory: Negative for cough and chest tightness.        SOB with exertion.   Cardiovascular: Negative for palpitations and leg swelling.       Chest pain as  outlined.   Gastrointestinal: Negative for abdominal pain, diarrhea, nausea and vomiting.  Genitourinary: Negative for difficulty urinating and dysuria.  Musculoskeletal: Negative for joint swelling and myalgias.  Skin: Negative for color change and rash.  Neurological: Negative for dizziness, light-headedness and headaches.  Psychiatric/Behavioral: Negative for agitation and dysphoric mood.       Objective:    Physical Exam Vitals reviewed.  Constitutional:      General: She is not in acute distress.    Appearance: Normal appearance.  HENT:     Head: Normocephalic and atraumatic.     Right Ear: External ear normal.     Left Ear: External ear normal.  Eyes:     General: No scleral icterus.       Right eye: No discharge.        Left eye: No discharge.     Conjunctiva/sclera: Conjunctivae normal.  Neck:     Thyroid: No thyromegaly.  Cardiovascular:     Rate and Rhythm: Normal rate and regular rhythm.  Pulmonary:     Effort: No respiratory distress.     Breath sounds: Normal breath sounds. No wheezing.  Abdominal:     General: Bowel sounds are normal.     Palpations: Abdomen is soft.     Tenderness: There is no abdominal tenderness.  Musculoskeletal:        General: No swelling or tenderness.     Cervical back: Neck supple. No tenderness.  Lymphadenopathy:     Cervical: No cervical adenopathy.  Skin:    Findings: No erythema or rash.  Neurological:     Mental Status: She is alert.  Psychiatric:        Mood and Affect: Mood normal.        Behavior: Behavior normal.     BP 130/80   Pulse 65   Temp 98.1 F (36.7 C) (Oral)   Resp 16   Ht 5\' 9"  (1.753 m)   Wt (!) 416 lb (188.7 kg)   LMP 12/29/2012   SpO2 99%   BMI 61.43 kg/m  Wt Readings from Last 3 Encounters:  01/21/21 (!) 416 lb (188.7 kg)  10/25/20 (!) 424 lb 12.8 oz (192.7 kg)  08/16/20 (!) 413 lb 6.4 oz (187.5 kg)     Lab Results  Component Value Date   WBC 5.0 01/21/2021   HGB 14.0 01/21/2021    HCT 41.7 01/21/2021   PLT 103.0 (L) 01/21/2021   GLUCOSE 112 (H) 01/21/2021   CHOL 182 01/21/2021   TRIG 86.0 01/21/2021   HDL 69.00 01/21/2021   LDLDIRECT 140.7 07/13/2013   LDLCALC 96 01/21/2021   ALT 14 01/21/2021   AST 14 01/21/2021   NA  139 01/21/2021   K 4.0 01/21/2021   CL 101 01/21/2021   CREATININE 1.00 01/21/2021   BUN 19 01/21/2021   CO2 29 01/21/2021   TSH 1.130 12/19/2019   HGBA1C 6.7 (H) 01/21/2021   MICROALBUR 0.9 01/21/2021    MM 3D SCREEN BREAST BILATERAL  Result Date: 12/26/2019 CLINICAL DATA:  Screening. EXAM: DIGITAL SCREENING BILATERAL MAMMOGRAM WITH TOMO AND CAD COMPARISON:  Previous exam(s). ACR Breast Density Category a: The breast tissue is almost entirely fatty. FINDINGS: There are no findings suspicious for malignancy. Images were processed with CAD. IMPRESSION: No mammographic evidence of malignancy. A result letter of this screening mammogram will be mailed directly to the patient. RECOMMENDATION: Screening mammogram in one year. (Code:SM-B-01Y) BI-RADS CATEGORY  1: Negative. Electronically Signed   By: Everlean Alstrom M.D.   On: 12/26/2019 16:37       Assessment & Plan:   Problem List Items Addressed This Visit    Chest pain    Chest pain as outlined.  Some sob with exertion.  EKG - SR with no acute ischemic changes.  Discussed.  Refer to cardiology with question of need for further cardiac w/up.        Relevant Orders   EKG 12-Lead (Completed)   Ambulatory referral to Cardiology   Diabetes mellitus without complication (HCC)    Low carb diet and exercise.  Insurance denying wegovy and ozempic.  Start metformin.  Follow met b and a1c.       Relevant Medications   metFORMIN (GLUCOPHAGE XR) 500 MG 24 hr tablet   Essential hypertension, benign    Continue triam/hctz.  Follow pressures.  Follow metabolic panel.       Hypercholesteremia    On crestor.  Low cholesterol diet and exercise.  Follow lipid panel and liver function tests.         Hyperglycemia    Low carb diet and exercise.  Follow met b and a1c.        Morbid obesity with BMI of 60.0-69.9, adult ([place])    Was agreeable to start GLP 1 receptor agonists.  Insurance denying.  Start metformin.  Follow.        Relevant Medications   metFORMIN (GLUCOPHAGE XR) 500 MG 24 hr tablet   Thrombocytopenia (HCC)    Follow cbc.       Tobacco abuse    Quit smoking previously.  With recent stress, started smoking cigars.  Has quit now.  Follow.  Remain off.           Einar Pheasant, MD

## 2021-01-21 NOTE — Addendum Note (Signed)
Addended by: Warden Fillers on: 01/21/2021 02:31 PM   Modules accepted: Orders

## 2021-01-22 LAB — BASIC METABOLIC PANEL
BUN: 19 mg/dL (ref 6–23)
CO2: 29 mEq/L (ref 19–32)
Calcium: 9.4 mg/dL (ref 8.4–10.5)
Chloride: 101 mEq/L (ref 96–112)
Creatinine, Ser: 1 mg/dL (ref 0.40–1.20)
GFR: 62.07 mL/min (ref >60.00)
Glucose, Bld: 112 mg/dL — ABNORMAL HIGH (ref 70–99)
Potassium: 4 mEq/L (ref 3.5–5.1)
Sodium: 139 mEq/L (ref 135–145)

## 2021-01-22 LAB — LIPID PANEL
Cholesterol: 182 mg/dL (ref 0–200)
HDL: 69 mg/dL (ref >39.00)
LDL Cholesterol: 96 mg/dL (ref 0–99)
NonHDL: 112.97
Total CHOL/HDL Ratio: 3
Triglycerides: 86 mg/dL (ref 0.0–149.0)
VLDL: 17.2 mg/dL (ref 0.0–40.0)

## 2021-01-22 LAB — CBC WITH DIFFERENTIAL/PLATELET
Basophils Absolute: 0.1 10*3/uL (ref 0.0–0.1)
Basophils Relative: 1.3 % (ref 0.0–3.0)
Eosinophils Absolute: 0.1 10*3/uL (ref 0.0–0.7)
Eosinophils Relative: 2.7 % (ref 0.0–5.0)
HCT: 41.7 % (ref 36.0–46.0)
Hemoglobin: 14 g/dL (ref 12.0–15.0)
Lymphocytes Relative: 35 % (ref 12.0–46.0)
Lymphs Abs: 1.7 10*3/uL (ref 0.7–4.0)
MCHC: 33.4 g/dL (ref 30.0–36.0)
MCV: 85.4 fl (ref 78.0–100.0)
Monocytes Absolute: 0.4 10*3/uL (ref 0.1–1.0)
Monocytes Relative: 8.4 % (ref 3.0–12.0)
Neutro Abs: 2.6 10*3/uL (ref 1.4–7.7)
Neutrophils Relative %: 52.6 % (ref 43.0–77.0)
Platelets: 103 10*3/uL — ABNORMAL LOW (ref 150.0–400.0)
RBC: 4.89 Mil/uL (ref 3.87–5.11)
RDW: 15 % (ref 11.5–15.5)
WBC: 5 10*3/uL (ref 4.0–10.5)

## 2021-01-22 LAB — MICROALBUMIN / CREATININE URINE RATIO
Creatinine,U: 98.1 mg/dL
Microalb Creat Ratio: 0.9 mg/g (ref 0.0–30.0)
Microalb, Ur: 0.9 mg/dL (ref 0.0–1.9)

## 2021-01-22 LAB — HEPATIC FUNCTION PANEL
ALT: 14 U/L (ref 0–35)
AST: 14 U/L (ref 0–37)
Albumin: 4.3 g/dL (ref 3.5–5.2)
Alkaline Phosphatase: 77 U/L (ref 39–117)
Bilirubin, Direct: 0.1 mg/dL (ref 0.0–0.3)
Total Bilirubin: 0.4 mg/dL (ref 0.2–1.2)
Total Protein: 7.7 g/dL (ref 6.0–8.3)

## 2021-01-22 LAB — HEMOGLOBIN A1C: Hgb A1c MFr Bld: 6.7 % — ABNORMAL HIGH (ref 4.6–6.5)

## 2021-01-26 ENCOUNTER — Encounter: Payer: Self-pay | Admitting: Internal Medicine

## 2021-01-26 NOTE — Assessment & Plan Note (Signed)
Was agreeable to start GLP 1 receptor agonists.  Insurance denying.  Start metformin.  Follow.

## 2021-01-26 NOTE — Assessment & Plan Note (Signed)
Quit smoking previously.  With recent stress, started smoking cigars.  Has quit now.  Follow.  Remain off.

## 2021-01-26 NOTE — Assessment & Plan Note (Signed)
Continue triam/hctz.  Follow pressures.  Follow metabolic panel.  

## 2021-01-26 NOTE — Assessment & Plan Note (Signed)
Follow cbc.  

## 2021-01-26 NOTE — Assessment & Plan Note (Signed)
Low carb diet and exercise.  Follow met b and a1c.   

## 2021-01-26 NOTE — Assessment & Plan Note (Signed)
Chest pain as outlined.  Some sob with exertion.  EKG - SR with no acute ischemic changes.  Discussed.  Refer to cardiology with question of need for further cardiac w/up.

## 2021-01-26 NOTE — Assessment & Plan Note (Signed)
On crestor.  Low cholesterol diet and exercise.  Follow lipid panel and liver function tests.   

## 2021-01-26 NOTE — Assessment & Plan Note (Signed)
Low carb diet and exercise.  Insurance denying wegovy and ozempic.  Start metformin.  Follow met b and a1c.

## 2021-02-03 ENCOUNTER — Ambulatory Visit: Payer: Managed Care, Other (non HMO) | Admitting: Pharmacist

## 2021-02-03 DIAGNOSIS — E119 Type 2 diabetes mellitus without complications: Secondary | ICD-10-CM

## 2021-02-03 DIAGNOSIS — E7800 Pure hypercholesterolemia, unspecified: Secondary | ICD-10-CM

## 2021-02-03 DIAGNOSIS — I10 Essential (primary) hypertension: Secondary | ICD-10-CM

## 2021-02-03 NOTE — Chronic Care Management (AMB) (Signed)
Care Management   Pharmacy Note  02/03/2021 Name: Morgan Duncan MRN: [id number] DOB: November 16, 1961  Subjective: Morgan Duncan is a 60 y.o. year old female who is a primary care patient of Dale Purcell, MD. The Care Management team was consulted for assistance with care management and care coordination needs.    Engaged with patient by telephone for follow up visit in response to provider referral for pharmacy case management and/or care coordination services.   The patient was given information about Care Management services today including:  1. Care Management services includes personalized support from designated clinical staff supervised by the patient's primary care provider, including individualized plan of care and coordination with other care providers. 2. 24/7 contact phone numbers for assistance for urgent and routine care needs. 3. The patient may stop case management services at any time by phone call to the office staff.  Patient agreed to services and consent obtained.  Assessment:  Review of patient status, including review of consultants reports, laboratory and other test data, was performed as part of comprehensive evaluation and provision of chronic care management services.   SDOH (Social Determinants of Health) assessments and interventions performed:  SDOH Interventions   Flowsheet Row Most Recent Value  SDOH Interventions   SDOH Interventions for the Following Domains Stress  Stress Interventions Provide Counseling       Objective:  Lab Results  Component Value Date   CREATININE 1.00 01/21/2021   CREATININE 0.93 07/31/2020   CREATININE 1.13 (H) 12/19/2019    Lab Results  Component Value Date   HGBA1C 6.7 (H) 01/21/2021       Component Value Date/Time   CHOL 182 01/21/2021 1431   CHOL 173 07/31/2020 1000   TRIG 86.0 01/21/2021 1431   HDL 69.00 01/21/2021 1431   HDL 61 07/31/2020 1000   CHOLHDL 3 01/21/2021 1431   VLDL 17.2 01/21/2021 1431    LDLCALC 96 01/21/2021 1431   LDLCALC 92 07/31/2020 1000   LDLDIRECT 140.7 07/13/2013 1138   Lab Results  Component Value Date   WBC 5.0 01/21/2021   HGB 14.0 01/21/2021   HCT 41.7 01/21/2021   MCV 85.4 01/21/2021   PLT 103.0 (L) 01/21/2021    Clinical ASCVD: No  The 10-year ASCVD risk score Denman George DC Jr., et al., 2013) is: 11.3%   Values used to calculate the score:     Age: 89 years     Sex: Female     Is Non-Hispanic African American: Yes     Diabetic: Yes     Tobacco smoker: No     Systolic Blood Pressure: 130 mmHg     Is BP treated: Yes     HDL Cholesterol: 69 mg/dL     Total Cholesterol: 182 mg/dL     BP Readings from Last 3 Encounters:  01/21/21 130/80  10/25/20 138/74  08/16/20 (!) 111/53    Care Plan  No Known Allergies  Medications Reviewed Today    Reviewed by Lourena Simmonds, RPH-CPP (Pharmacist) on 02/03/21 at 1131  Med List Status: <None>  Medication Order Taking? Sig Documenting Provider Last Dose Status Informant  acetaminophen (TYLENOL) 500 MG tablet [id number]  Take 500 mg by mouth every 6 (six) hours as needed. [provider]  Active   aspirin EC 81 MG tablet [id number] Yes Take 81 mg by mouth daily. Swallow whole. [provider] Taking Active   KLOR-CON M10 10 MEQ tablet [id number] Yes TAKE 1 TABLET BY MOUTH EVERY DAY Scott,  Westley Hummer, MD Taking Active   metFORMIN (GLUCOPHAGE XR) 500 MG 24 hr tablet [id number] Yes Take 1 tablet (500 mg total) by mouth daily with breakfast. Dale [place], MD Taking Active         Discontinued 04/16/14 [phone] (Reorder)   rosuvastatin (CRESTOR) 10 MG tablet [id number] Yes TAKE 1 TABLET BY MOUTH EVERY DAY Dale [place], MD Taking Active   triamterene-hydrochlorothiazide (MAXZIDE-25) 37.5-25 MG tablet [id number] Yes Take 1 tablet by mouth daily. Dale [place], MD Taking Active           Patient Active Problem List   Diagnosis Date Noted  . Chest pain 01/21/2021  . Snoring 03/17/2020  . Diabetes  mellitus without complication (HCC) 03/08/2019  . Right knee pain 07/17/2017  . Hypercholesteremia 03/26/2016  . Health care maintenance 09/14/2015  . Hyperglycemia 09/08/2015  . Morbid obesity with BMI of 60.0-69.9, adult (HCC) 09/08/2015  . Left knee pain 05/20/2014  . Palpitations 01/20/2014  . Microcalcifications of the breast 08/17/2013  . Essential hypertension, benign 01/18/2013  . Thrombocytopenia (HCC) 01/18/2013  . History of colonic polyps 01/18/2013  . Tobacco abuse 01/18/2013    Conditions to be addressed/monitored: HTN, HLD and DMII  Care Plan : Medication Management  Updates made by Lourena Simmonds, RPH-CPP since 02/03/2021 12:00 AM    Problem: Obesity, Diabetes, Hypertension, Hyperlipidemia     Long-Range Goal: Disease Progression Prevention   This Visit's Progress: On track  Recent Progress: On track  Priority: High  Note:   Current Barriers:  . Unable to achieve control of weight   Pharmacist Clinical Goal(s):  [name] Over the next 90 days, patient will achieve 5-10% reduction in body weight through collaboration with PharmD and provider.   Interventions: . 1:1 collaboration with Dale [place], MD regarding development and update of comprehensive plan of care as evidenced by provider attestation and co-signature . Inter-disciplinary care team collaboration (see longitudinal plan of care) . Comprehensive medication review performed; medication list updated in electronic medical record  Obesity, Diabetes: . A1c uncontrolled above goal 6.5%, unable to independently manage weight; current treatment: metformin XR 500 mg daily o Attempted Ozempic. Insurance did not cover due to wanting step therapy of metformin. . Baseline weight: 424 lbs (BMI 62) . Notes some diarrhea that has resolved, notes some GI upset with metformin. Taking with lunch. Some "shakiness" occasionally . Discussed concept of symptomatic hypoglycemia. Encouraged to eat 1 piece of hard candy,  small sip of juice/soda to improve negative symptoms of hypoglycemia, though explained that it is unlikely metformin is pushing her sugars to a true hypoglycemic episode.  . Continue current regimen at this time. Notify if persistent diarrhea or persistent troublesome shakiness.    Hypertension: . Controlled per last clinic reading; current treatment: triamterene/HCTZ 37.5/25 mg daily, potassium 10 mEq daily . Recommended to continue current regimen at this time  Hyperlipidemia: . Uncontrolled given DM + risk factors of CKD, family history of ASCVD (Father died from MI in his 70s, brother died from MI in his 78s); current treatment: rosuvastatin 10 mg daily  . Noted some "chest pain" to PCP at last visit. EKG normal, but cardiology referral placed. Patient today notes that she thinks her symptoms may be related to anxiety/occasional grief related to passing of several family members. She notes that she works on deep breathing and mindfulness when she feels this way.  [name] Hx tobacco use, quit ~7 years ago . Antiplatelet regimen: aspirin 81 mg daily  . Agree with cardiology referral for  work up, given family history of ASCVD.  Patient Goals/Self-Care Activities . Over the next 90 days, patient will:  - take medications as prescribed collaborate with provider on medication access solutions target a minimum of 150 minutes of moderate intensity exercise weekly engage in dietary modifications by incorporating healthy proteins, vegetables, fruits, reducing quick carb meals  Follow Up Plan: Telephone follow up appointment with care management team member scheduled for: ~ 6 weeks      Medication Assistance:  None required.  Patient affirms current coverage meets needs.  Follow Up:  Patient agrees to Care Plan and Follow-up.  Plan: Telephone follow up appointment with care management team member scheduled for:  ~ 6 weeks  [name], PharmD, [place], CPP Clinical Pharmacist [employer]  at [employer] [facility] [phone]

## 2021-02-03 NOTE — Patient Instructions (Signed)
Visit Information  Goals Addressed              This Visit's Progress     Patient Stated   .  Medication Monitoring (pt-stated)        Patient Goals/Self-Care Activities . Over the next 90 days, patient will:  - take medications as prescribed collaborate with provider on medication access solutions target a minimum of 150 minutes of moderate intensity exercise weekly engage in dietary modifications by incorporating healthy proteins, vegetables, fruits, reducing quick carb meals        Patient verbalizes understanding of instructions provided today and agrees to view in MyChart.  Plan: Telephone follow up appointment with care management team member scheduled for:  ~ 6 weeks  [name], PharmD, [place], CPP Clinical Pharmacist [facility] at [employer] [phone]

## 2021-02-04 ENCOUNTER — Other Ambulatory Visit: Payer: Self-pay

## 2021-02-04 ENCOUNTER — Ambulatory Visit
Admission: RE | Admit: 2021-02-04 | Discharge: 2021-02-04 | Disposition: A | Discharge status: Home / Self Care | Payer: Managed Care, Other (non HMO) | Source: Ambulatory Visit | Attending: Internal Medicine | Admitting: Internal Medicine

## 2021-02-04 DIAGNOSIS — Z1231 Encounter for screening mammogram for malignant neoplasm of breast: Secondary | ICD-10-CM | POA: Diagnosis not present

## 2021-03-24 ENCOUNTER — Ambulatory Visit: Payer: Managed Care, Other (non HMO) | Admitting: Pharmacist

## 2021-03-24 DIAGNOSIS — I10 Essential (primary) hypertension: Secondary | ICD-10-CM

## 2021-03-24 DIAGNOSIS — E119 Type 2 diabetes mellitus without complications: Secondary | ICD-10-CM

## 2021-03-24 DIAGNOSIS — E7800 Pure hypercholesterolemia, unspecified: Secondary | ICD-10-CM

## 2021-03-24 MED ORDER — OZEMPIC (0.25 OR 0.5 MG/DOSE) 2 MG/1.5ML ~~LOC~~ SOPN: 0.5000 mg | PEN_INJECTOR | SUBCUTANEOUS | 2 refills | Status: DC

## 2021-03-24 NOTE — Chronic Care Management (AMB) (Addendum)
Care Management   Pharmacy Note  03/24/2021 Name: Morgan Duncan MRN: [id number] DOB: Sep 22, 1961  Subjective: Morgan Duncan is a 60 y.o. year old female who is a primary care patient of Dale Industry, MD. The Care Management team was consulted for assistance with care management and care coordination needs.    Engaged with patient by telephone for follow up visit in response to provider referral for pharmacy case management and/or care coordination services.   The patient was given information about Care Management services today including:  1. Care Management services includes personalized support from designated clinical staff supervised by the patient's primary care provider, including individualized plan of care and coordination with other care providers. 2. 24/7 contact phone numbers for assistance for urgent and routine care needs. 3. The patient may stop case management services at any time by phone call to the office staff.  Patient agreed to services and consent obtained.  Assessment:  Review of patient status, including review of consultants reports, laboratory and other test data, was performed as part of comprehensive evaluation and provision of chronic care management services.   SDOH (Social Determinants of Health) assessments and interventions performed:  SDOH Interventions   Flowsheet Row Most Recent Value  SDOH Interventions   Financial Strain Interventions Intervention Not Indicated       Objective:  Lab Results  Component Value Date   CREATININE 1.00 01/21/2021   CREATININE 0.93 07/31/2020   CREATININE 1.13 (H) 12/19/2019    Lab Results  Component Value Date   HGBA1C 6.7 (H) 01/21/2021       Component Value Date/Time   CHOL 182 01/21/2021 1431   CHOL 173 07/31/2020 1000   TRIG 86.0 01/21/2021 1431   HDL 69.00 01/21/2021 1431   HDL 61 07/31/2020 1000   CHOLHDL 3 01/21/2021 1431   VLDL 17.2 01/21/2021 1431   LDLCALC 96 01/21/2021 1431    LDLCALC 92 07/31/2020 1000   LDLDIRECT 140.7 07/13/2013 1138    Clinical ASCVD: No  The 10-year ASCVD risk score Denman George DC Jr., et al., 2013) is: 11.3%   Values used to calculate the score:     Age: 89 years     Sex: Female     Is Non-Hispanic African American: Yes     Diabetic: Yes     Tobacco smoker: No     Systolic Blood Pressure: 130 mmHg     Is BP treated: Yes     HDL Cholesterol: 69 mg/dL     Total Cholesterol: 182 mg/dL     BP Readings from Last 3 Encounters:  01/21/21 130/80  10/25/20 138/74  08/16/20 (!) 111/53    Care Plan  No Known Allergies  Medications Reviewed Today    Reviewed by Lourena Simmonds, RPH-CPP (Pharmacist) on 02/03/21 at 1131  Med List Status: <None>  Medication Order Taking? Sig Documenting Provider Last Dose Status Informant  acetaminophen (TYLENOL) 500 MG tablet [id number]  Take 500 mg by mouth every 6 (six) hours as needed. [provider]  Active   aspirin EC 81 MG tablet [id number] Yes Take 81 mg by mouth daily. Swallow whole. [provider] Taking Active   KLOR-CON M10 10 MEQ tablet [id number] Yes TAKE 1 TABLET BY MOUTH EVERY DAY Dale [place], MD Taking Active   metFORMIN (GLUCOPHAGE XR) 500 MG 24 hr tablet [id number] Yes Take 1 tablet (500 mg total) by mouth daily with breakfast. Dale , MD Taking Active  Discontinued 04/16/14 0937 (Reorder)   rosuvastatin (CRESTOR) 10 MG tablet [id number] Yes TAKE 1 TABLET BY MOUTH EVERY DAY Dale [place], MD Taking Active   triamterene-hydrochlorothiazide [facility]) 37.5-25 MG tablet [id number] Yes Take 1 tablet by mouth daily. Dale [place], MD Taking Active           Patient Active Problem List   Diagnosis Date Noted  . Chest pain 01/21/2021  . Snoring 03/17/2020  . Diabetes mellitus without complication (HCC) 03/08/2019  . Right knee pain 07/17/2017  . Hypercholesteremia 03/26/2016  . Health care maintenance 09/14/2015  . Hyperglycemia 09/08/2015  .  Morbid obesity with BMI of 60.0-69.9, adult (HCC) 09/08/2015  . Left knee pain 05/20/2014  . Palpitations 01/20/2014  . Microcalcifications of the breast 08/17/2013  . Essential hypertension, benign 01/18/2013  . Thrombocytopenia (HCC) 01/18/2013  . History of colonic polyps 01/18/2013  . Tobacco abuse 01/18/2013    Conditions to be addressed/monitored: HTN, HLD and DMII  Care Plan : Medication Management  Updates made by Lourena Simmonds, RPH-CPP since 03/24/2021 12:00 AM    Problem: Obesity, Diabetes, Hypertension, Hyperlipidemia     Long-Range Goal: Disease Progression Prevention   This Visit's Progress: On track  Recent Progress: On track  Priority: High  Note:   Current Barriers:  . Unable to achieve control of weight   Pharmacist Clinical Goal(s):  [name] Over the next 90 days, patient will achieve 5-10% reduction in body weight through collaboration with PharmD and provider.   Interventions: . 1:1 collaboration with Dale [place], MD regarding development and update of comprehensive plan of care as evidenced by provider attestation and co-signature . Inter-disciplinary care team collaboration (see longitudinal plan of care) . Comprehensive medication review performed; medication list updated in electronic medical record  Obesity, Diabetes: . A1c uncontrolled above goal 6.5%, unable to independently manage weight; current treatment: metformin XR 500 mg daily . Notes some stomach upset, especially if she does not eat breakfast prior to taking metformin. Notes constant diarrhea when she first started the medication, but that has subsided.  o Attempted Ozempic. Insurance did not cover due to wanting step therapy of metformin. . Baseline weight: 424 lbs (BMI 62). Has not weighed recently.  . Would prefer to add Ozempic at this time rather than titrate metformin and risk recurrence of diarrhea. Completed new PA for Ozempic therapy as patient is now on metformin therapy. PA  approved. Start Ozempic 0.25 mg weekly for 4 weeks, then increase to 0.5 mg weekly. Will send to pharmacy. Patient can bring to her appointment on Friday with Dr. Lorin Picket if she would like additional administration education. Counseled on availability of Ozempic savings card on [employer].  . Noted that she could try taking metformin in the evening after supper, if that is easier to manage as she doesn't always eat breakfast. She will try this. Continue metformin for now. Can consider discontinuation pending results of time on Ozempic  . F/u with PCP later this week.    Hypertension: . Controlled per last clinic reading; current treatment: triamterene/HCTZ 37.5/25 mg daily, potassium 10 mEq daily . Recommended to continue current regimen at this time  Hyperlipidemia: . Uncontrolled given DM + risk factors of CKD, family history of ASCVD (Father died from MI in his 65s, brother died from MI in his 67s); current treatment: rosuvastatin 10 mg daily  . Noted some "chest pain" to PCP at last visit. EKG normal, but cardiology referral placed. Patient notes that she did not schedule an appointment  because she didn't think she needed to, given normal EKG and the fact that she ties the chest pain episode to times when she is thinking about the loss of her brother.  . Hx tobacco use, quit ~7 years ago . Antiplatelet regimen: aspirin 81 mg daily  . Reviewed that establishing with cardiology for evaluation of chest pain and overall ASCVD prevention would be prudent, in light of strong family history of ASCVD. She verbalizes understanding. Requests that I send her the phone number for [facility] in a Mychart message.   Patient Goals/Self-Care Activities . Over the next 90 days, patient will:  - take medications as prescribed collaborate with provider on medication access solutions target a minimum of 150 minutes of moderate intensity exercise weekly engage in dietary modifications by  incorporating healthy proteins, vegetables, fruits, reducing quick carb meals  Follow Up Plan: Telephone follow up appointment with care management team member scheduled for: ~ 5 weeks      Medication Assistance:  None required.  Patient affirms current coverage meets needs.  Follow Up:  Patient agrees to Care Plan and Follow-up.  Plan: Telephone follow up appointment with care management team member scheduled for:  pending medication access plan  [name], PharmD, [place], CPP Clinical Pharmacist [facility] at [facility] [phone]

## 2021-03-24 NOTE — Addendum Note (Signed)
Addended by: Lourena Simmonds on: 03/24/2021 12:13 PM   Modules accepted: Orders

## 2021-03-24 NOTE — Patient Instructions (Signed)
Visit Information  Goals Addressed              This Visit's Progress     Patient Stated   .  Medication Monitoring (pt-stated)        Patient Goals/Self-Care Activities . Over the next 90 days, patient will:  - take medications as prescribed collaborate with provider on medication access solutions target a minimum of 150 minutes of moderate intensity exercise weekly engage in dietary modifications by incorporating healthy proteins, vegetables, fruits, reducing quick carb meals       Patient verbalizes understanding of instructions provided today and agrees to view in MyChart.   Plan: Telephone follow up appointment with care management team member scheduled for:  pending medication access plan  [name], PharmD, [place], CPP Clinical Pharmacist [facility] at [facility] [phone]

## 2021-03-28 ENCOUNTER — Other Ambulatory Visit: Payer: Self-pay | Admitting: Internal Medicine

## 2021-03-28 ENCOUNTER — Other Ambulatory Visit: Payer: Self-pay

## 2021-03-28 ENCOUNTER — Ambulatory Visit (INDEPENDENT_AMBULATORY_CARE_PROVIDER_SITE_OTHER): Payer: Managed Care, Other (non HMO) | Admitting: Internal Medicine

## 2021-03-28 ENCOUNTER — Telehealth: Payer: Self-pay | Admitting: Internal Medicine

## 2021-03-28 DIAGNOSIS — I10 Essential (primary) hypertension: Secondary | ICD-10-CM | POA: Diagnosis not present

## 2021-03-28 DIAGNOSIS — Z86010 Personal history of colonic polyps: Secondary | ICD-10-CM

## 2021-03-28 DIAGNOSIS — D696 Thrombocytopenia, unspecified: Secondary | ICD-10-CM

## 2021-03-28 DIAGNOSIS — E1165 Type 2 diabetes mellitus with hyperglycemia: Secondary | ICD-10-CM | POA: Diagnosis not present

## 2021-03-28 DIAGNOSIS — E7800 Pure hypercholesterolemia, unspecified: Secondary | ICD-10-CM

## 2021-03-28 DIAGNOSIS — Z6841 Body Mass Index (BMI) 40.0 and over, adult: Secondary | ICD-10-CM

## 2021-03-28 DIAGNOSIS — T63421A Toxic effect of venom of ants, accidental (unintentional), initial encounter: Secondary | ICD-10-CM

## 2021-03-28 MED ORDER — NYSTATIN 100000 UNIT/GM EX CREA: 1.0000 "application " | TOPICAL_CREAM | Freq: Two times a day (BID) | CUTANEOUS | 0 refills | Status: DC

## 2021-03-28 MED ORDER — TRIAMCINOLONE ACETONIDE 0.1 % EX CREA: 1.0000 "application " | TOPICAL_CREAM | Freq: Two times a day (BID) | CUTANEOUS | 0 refills | Status: DC
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
## 2021-03-28 NOTE — Telephone Encounter (Signed)
Patient would like to do her labs at [facility], fax number is [phone] or mail to patient. Patient's next appt is on 06/06/21. Dr Lorin Picket would like labs done 1-2 days before appt.

## 2021-04-01 ENCOUNTER — Ambulatory Visit: Payer: Managed Care, Other (non HMO) | Admitting: Pharmacist

## 2021-04-01 DIAGNOSIS — Z6841 Body Mass Index (BMI) 40.0 and over, adult: Secondary | ICD-10-CM

## 2021-04-01 DIAGNOSIS — E119 Type 2 diabetes mellitus without complications: Secondary | ICD-10-CM

## 2021-04-01 DIAGNOSIS — I10 Essential (primary) hypertension: Secondary | ICD-10-CM

## 2021-04-01 DIAGNOSIS — E7800 Pure hypercholesterolemia, unspecified: Secondary | ICD-10-CM

## 2021-04-01 NOTE — Chronic Care Management (AMB) (Signed)
Care Management   Pharmacy Note  04/01/2021 Name: DANICIA TERHAAR MRN: [id number] DOB: [birth year]  Subjective: Morgan Duncan is a 60 y.o. year old female who is a primary care patient of Dale [place], MD. The Care Management team was consulted for assistance with care management and care coordination needs.    Engaged with patient via myChart for follow up visit in response to provider referral for pharmacy case management and/or care coordination services.   The patient was given information about Care Management services today including:  1. Care Management services includes personalized support from designated clinical staff supervised by the patient's primary care provider, including individualized plan of care and coordination with other care providers. 2. 24/7 contact phone numbers for assistance for urgent and routine care needs. 3. The patient may stop case management services at any time by phone call to the office staff.  Patient agreed to services and consent obtained.  Assessment:  Review of patient status, including review of consultants reports, laboratory and other test data, was performed as part of comprehensive evaluation and provision of chronic care management services.   SDOH (Social Determinants of Health) assessments and interventions performed:    Objective:  Lab Results  Component Value Date   CREATININE 1.00 01/21/2021   CREATININE 0.93 07/31/2020   CREATININE 1.13 (H) 12/19/2019    Lab Results  Component Value Date   HGBA1C 6.7 (H) 01/21/2021       Component Value Date/Time   CHOL 182 01/21/2021 1431   CHOL 173 07/31/2020 1000   TRIG 86.0 01/21/2021 1431   HDL 69.00 01/21/2021 1431   HDL 61 07/31/2020 1000   CHOLHDL 3 01/21/2021 1431   VLDL 17.2 01/21/2021 1431   LDLCALC 96 01/21/2021 1431   LDLCALC 92 07/31/2020 1000   LDLDIRECT 140.7 07/13/2013 1138    Clinical ASCVD: No  The 10-year ASCVD risk score Denman George DC Jr., et al., 2013) is:  9.8%   Values used to calculate the score:     Age: 33 years     Sex: Female     Is Non-Hispanic African American: Yes     Diabetic: Yes     Tobacco smoker: No     Systolic Blood Pressure: 124 mmHg     Is BP treated: Yes     HDL Cholesterol: 69 mg/dL     Total Cholesterol: 182 mg/dL     BP Readings from Last 3 Encounters:  03/28/21 124/70  01/21/21 130/80  10/25/20 138/74    Care Plan  No Known Allergies  Medications Reviewed Today    Reviewed by Larry Sierras, LPN (Licensed Practical Nurse) on 03/28/21 at 1141  Med List Status: <None>  Medication Order Taking? Sig Documenting Provider Last Dose Status Informant  acetaminophen (TYLENOL) 500 MG tablet [id number] No Take 500 mg by mouth every 6 (six) hours as needed. [provider] Taking Active   aspirin EC 81 MG tablet [id number] No Take 81 mg by mouth daily. Swallow whole. [provider] Taking Active   KLOR-CON M10 10 MEQ tablet [id number] No TAKE 1 TABLET BY MOUTH EVERY DAY Dale [place], MD Taking Active   metFORMIN (GLUCOPHAGE XR) 500 MG 24 hr tablet [id number] No Take 1 tablet (500 mg total) by mouth daily with breakfast. Dale [place], MD Taking Active         Discontinued 04/16/14 [phone] (Reorder)   rosuvastatin (CRESTOR) 10 MG tablet [id number] No TAKE 1 TABLET BY MOUTH EVERY DAY Dale [place], MD  Taking Active   Semaglutide,0.25 or 0.5MG /DOS, (OZEMPIC, 0.25 OR 0.5 MG/DOSE,) 2 MG/1.5ML SOPN [id number]  Inject 0.5 mg into the skin once a week. Dale [place], MD  Active   triamterene-hydrochlorothiazide [facility]) 37.5-25 MG tablet [id number] No Take 1 tablet by mouth daily. Dale [place], MD Taking Active           Patient Active Problem List   Diagnosis Date Noted  . Chest pain 01/21/2021  . Snoring 03/17/2020  . Diabetes mellitus without complication (HCC) 03/08/2019  . Right knee pain 07/17/2017  . Hypercholesteremia 03/26/2016  . Health care maintenance 09/14/2015  . Hyperglycemia  09/08/2015  . Morbid obesity with BMI of 60.0-69.9, adult (HCC) 09/08/2015  . Left knee pain 05/20/2014  . Palpitations 01/20/2014  . Microcalcifications of the breast 08/17/2013  . Essential hypertension, benign 01/18/2013  . Thrombocytopenia (HCC) 01/18/2013  . History of colonic polyps 01/18/2013  . Tobacco abuse 01/18/2013    Conditions to be addressed/monitored: HTN, HLD and obesity  Care Plan : Medication Management  Updates made by Lourena Simmonds, RPH-CPP since 04/01/2021 12:00 AM    Problem: Obesity, Diabetes, Hypertension, Hyperlipidemia     Long-Range Goal: Disease Progression Prevention   This Visit's Progress: On track  Recent Progress: On track  Priority: High  Note:   Current Barriers:  . Unable to achieve control of weight   Pharmacist Clinical Goal(s):  [name] Over the next 90 days, patient will achieve 5-10% reduction in body weight through collaboration with PharmD and provider.   Interventions: . 1:1 collaboration with Dale , MD regarding development and update of comprehensive plan of care as evidenced by provider attestation and co-signature . Inter-disciplinary care team collaboration (see longitudinal plan of care) . Comprehensive medication review performed; medication list updated in electronic medical record  Obesity, Diabetes: . A1c uncontrolled above goal 6.5%, unable to independently manage weight; current treatment: metformin XR 500 mg daily, Ozempic 0.25 mg weekly  . Baseline weight: 424 lbs (BMI 62). Has not weighed recently.  . Patient notes that she picked up Ozempic. Asks if metformin needs to be continued. Recommend to continue metformin at this time. Monitor impact of Ozempic therapy, can consider metformin discontinuation in the future, especially if continued stomach upset if she takes without food.   Hypertension: . Controlled per last clinic reading; current treatment: triamterene/HCTZ 37.5/25 mg daily, potassium 10 mEq  daily . Previously recommended to continue current regimen at this time  Hyperlipidemia: . Uncontrolled given DM + risk factors of CKD, family history of ASCVD (Father died from MI in his 8s, brother died from MI in his 49s); current treatment: rosuvastatin 10 mg daily  . Hx tobacco use, quit ~7 years ago . Antiplatelet regimen: aspirin 81 mg daily  . Previously discussed recommendation of following through on the cardiology referral. Patient amenable.   Patient Goals/Self-Care Activities . Over the next 90 days, patient will:  - take medications as prescribed collaborate with provider on medication access solutions target a minimum of 150 minutes of moderate intensity exercise weekly engage in dietary modifications by incorporating healthy proteins, vegetables, fruits, reducing quick carb meals  Follow Up Plan: Telephone follow up appointment with care management team member scheduled for: ~ 3 weeks as previously scheduled       Medication Assistance:  None required.  Patient affirms current coverage meets needs.  Follow Up:  Patient agrees to Care Plan and Follow-up.  Plan: Telephone follow up appointment with care management team member scheduled for:  ~  4 weeks as previously scheduled  [name], PharmD, [place], CPP Clinical Pharmacist [employer] at [employer] [phone]

## 2021-04-01 NOTE — Patient Instructions (Signed)
Visit Information  Goals Addressed              This Visit's Progress     Patient Stated   .  Medication Monitoring (pt-stated)        Patient Goals/Self-Care Activities . Over the next 90 days, patient will:  - take medications as prescribed collaborate with provider on medication access solutions target a minimum of 150 minutes of moderate intensity exercise weekly engage in dietary modifications by incorporating healthy proteins, vegetables, fruits, reducing quick carb meals        Patient verbalizes understanding of instructions provided today and agrees to view in MyChart.   Plan: Telephone follow up appointment with care management team member scheduled for:  ~4 weeks as previously scheduled  [name], PharmD, [place], CPP Clinical Pharmacist [employer] at [facility] [phone]

## 2021-04-06 ENCOUNTER — Encounter: Payer: Self-pay | Admitting: Internal Medicine

## 2021-04-06 ENCOUNTER — Telehealth: Payer: Self-pay | Admitting: Internal Medicine

## 2021-04-06 DIAGNOSIS — T63421A Toxic effect of venom of ants, accidental (unintentional), initial encounter: Secondary | ICD-10-CM | POA: Insufficient documentation

## 2021-04-06 NOTE — Telephone Encounter (Signed)
Please schedule a fasting lab appt in the next 4 weeks.  Thanks

## 2021-04-06 NOTE — Assessment & Plan Note (Signed)
Previously canceled her colonoscopy.  Discussed.  She will call insurance and see what is covered.  Will notify me when agreeable to schedule.
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
## 2021-04-06 NOTE — Assessment & Plan Note (Signed)
Discussed diet and exercise.  Discussed exercise.  Insurance approved ozempic.  Start as outlined.  Follow weight.  Diet and exercise.

## 2021-04-06 NOTE — Assessment & Plan Note (Signed)
Follow cbc.  

## 2021-04-06 NOTE — Assessment & Plan Note (Signed)
Discussed low carb diet and exercise.  She has adjusted her diet.  Monitoring carb intake.  Insurance approved ozempic.  Will start .25 dosing.  Follow sugars.  Keep f/u with Catie.  Follow met b and a1c.

## 2021-04-06 NOTE — Assessment & Plan Note (Signed)
Continue triam/hctz.  Follow pressures.  No changes made.  Follow metabolic panel.

## 2021-04-06 NOTE — Assessment & Plan Note (Signed)
Continue crestor.  Low cholesterol diet and exercise. Follow lipid panel and liver function tests.   

## 2021-04-07 NOTE — Telephone Encounter (Signed)
Called pt she [place] [facility]  Fax # [phone]

## 2021-04-08 NOTE — Telephone Encounter (Signed)
Letter printed with lab orders.  Please fax. Will need to be drawn in 3-4 weeks.

## 2021-04-08 NOTE — Telephone Encounter (Signed)
Letter printed.

## 2021-04-08 NOTE — Telephone Encounter (Signed)
See other message

## 2021-04-09 NOTE — Telephone Encounter (Signed)
LM for patient to advise below

## 2021-04-10 NOTE — Telephone Encounter (Signed)
Labs faxed

## 2021-04-27 ENCOUNTER — Other Ambulatory Visit: Payer: Self-pay | Admitting: Internal Medicine

## 2021-04-28 ENCOUNTER — Other Ambulatory Visit: Payer: Managed Care, Other (non HMO)

## 2021-05-02 ENCOUNTER — Ambulatory Visit: Payer: Managed Care, Other (non HMO) | Admitting: Pharmacist

## 2021-05-02 DIAGNOSIS — E1165 Type 2 diabetes mellitus with hyperglycemia: Secondary | ICD-10-CM

## 2021-05-02 DIAGNOSIS — I10 Essential (primary) hypertension: Secondary | ICD-10-CM

## 2021-05-02 DIAGNOSIS — E7800 Pure hypercholesterolemia, unspecified: Secondary | ICD-10-CM
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
## 2021-05-02 NOTE — Patient Instructions (Signed)
Visit Information   Goals Addressed               This Visit's Progress     Patient Stated     Medication Monitoring (pt-stated)        Patient Goals/Self-Care Activities Over the next 90 days, patient will:  - take medications as prescribed collaborate with provider on medication access solutions target a minimum of 150 minutes of moderate intensity exercise weekly engage in dietary modifications by incorporating healthy proteins, vegetables, fruits, reducing quick carb meals         Patient verbalizes understanding of instructions provided today and agrees to view in MyChart.  Plan: Telephone follow up appointment with care management team member scheduled for:  ~ 10 weeks  [name], PharmD, [place], CPP Clinical Pharmacist [employer] at [employer] [phone]

## 2021-05-28 ENCOUNTER — Other Ambulatory Visit: Payer: Managed Care, Other (non HMO)

## 2021-05-28 DIAGNOSIS — Z0000 Encounter for general adult medical examination without abnormal findings: Secondary | ICD-10-CM

## 2021-05-29 LAB — BASIC METABOLIC PANEL
BUN/Creatinine Ratio: 13 (ref 9–23)
BUN: 12 mg/dL (ref 6–24)
CO2: 24 mmol/L (ref 20–29)
Calcium: 9.4 mg/dL (ref 8.7–10.2)
Chloride: 103 mmol/L (ref 96–106)
Creatinine, Ser: 0.95 mg/dL (ref 0.57–1.00)
Glucose: 106 mg/dL — ABNORMAL HIGH (ref 65–99)
Potassium: 4.2 mmol/L (ref 3.5–5.2)
Sodium: 142 mmol/L (ref 134–144)
eGFR: 69 mL/min/{1.73_m2} (ref >59)

## 2021-05-29 LAB — CBC WITH DIFFERENTIAL/PLATELET
Basophils Absolute: 0 10*3/uL (ref 0.0–0.2)
Basos: 1 %
EOS (ABSOLUTE): 0.2 10*3/uL (ref 0.0–0.4)
Eos: 3 %
Hematocrit: 40 % (ref 34.0–46.6)
Hemoglobin: 13.1 g/dL (ref 11.1–15.9)
Immature Grans (Abs): 0 10*3/uL (ref 0.0–0.1)
Immature Granulocytes: 0 %
Lymphocytes Absolute: 2.2 10*3/uL (ref 0.7–3.1)
Lymphs: 39 %
MCH: 28.1 pg (ref 26.6–33.0)
MCHC: 32.8 g/dL (ref 31.5–35.7)
MCV: 86 fL (ref 79–97)
Monocytes Absolute: 0.4 10*3/uL (ref 0.1–0.9)
Monocytes: 7 %
Neutrophils Absolute: 2.7 10*3/uL (ref 1.4–7.0)
Neutrophils: 50 %
Platelets: 134 10*3/uL — ABNORMAL LOW (ref 150–450)
RBC: 4.66 x10E6/uL (ref 3.77–5.28)
RDW: 14.1 % (ref 11.7–15.4)
WBC: 5.5 10*3/uL (ref 3.4–10.8)

## 2021-05-29 LAB — HGB A1C W/O EAG: Hgb A1c MFr Bld: 6.3 % — ABNORMAL HIGH (ref 4.8–5.6)

## 2021-05-29 LAB — HEPATIC FUNCTION PANEL
ALT: 16 IU/L (ref 0–32)
AST: 13 IU/L (ref 0–40)
Albumin: 4.2 g/dL (ref 3.8–4.9)
Alkaline Phosphatase: 80 IU/L (ref 44–121)
Bilirubin Total: 0.3 mg/dL (ref 0.0–1.2)
Bilirubin, Direct: <0.1 mg/dL (ref 0.00–0.40)
Total Protein: 7.3 g/dL (ref 6.0–8.5)

## 2021-05-29 LAB — LIPID PANEL
Chol/HDL Ratio: 2.7 ratio (ref 0.0–4.4)
Cholesterol, Total: 164 mg/dL (ref 100–199)
HDL: 61 mg/dL (ref >39)
LDL Chol Calc (NIH): 84 mg/dL (ref 0–99)
Triglycerides: 103 mg/dL (ref 0–149)
VLDL Cholesterol Cal: 19 mg/dL (ref 5–40)

## 2021-05-29 LAB — TSH: TSH: 0.898 u[IU]/mL (ref 0.450–4.500)

## 2021-06-06 ENCOUNTER — Other Ambulatory Visit: Payer: Self-pay

## 2021-06-06 ENCOUNTER — Ambulatory Visit (INDEPENDENT_AMBULATORY_CARE_PROVIDER_SITE_OTHER): Payer: Managed Care, Other (non HMO) | Admitting: Internal Medicine

## 2021-06-06 ENCOUNTER — Encounter: Payer: Self-pay | Admitting: Internal Medicine

## 2021-06-06 DIAGNOSIS — I10 Essential (primary) hypertension: Secondary | ICD-10-CM | POA: Diagnosis not present

## 2021-06-06 DIAGNOSIS — M79652 Pain in left thigh: Secondary | ICD-10-CM

## 2021-06-06 DIAGNOSIS — D696 Thrombocytopenia, unspecified: Secondary | ICD-10-CM

## 2021-06-06 DIAGNOSIS — E7800 Pure hypercholesterolemia, unspecified: Secondary | ICD-10-CM

## 2021-06-06 DIAGNOSIS — E1165 Type 2 diabetes mellitus with hyperglycemia: Secondary | ICD-10-CM

## 2021-06-06 MED ORDER — METHYLPREDNISOLONE 4 MG PO TBPK: ORAL_TABLET | ORAL | 0 refills | Status: DC

## 2021-06-06 NOTE — Progress Notes (Signed)
Patient ID: ANETTE BARRA, female   DOB: 1961/01/20, 60 y.o.   MRN: [id number]   Subjective:    Patient ID: [name], female    DOB: Feb 09, 1961, 60 y.o.   MRN: [id number]  HPI This visit occurred during the SARS-CoV-2 public health emergency.  Safety protocols were in place, including screening questions prior to the visit, additional usage of staff PPE, and extensive cleaning of exam room while observing appropriate contact time as indicated for disinfecting solutions.   Patient here for a scheduled follow up.  Here to follow up regarding her blood pressure, blood sugar and weight loss.  On ozempic and tolerating.  Has lost weight - 12 pounds since last visit.  Trying to stay active.  Has noticed over the last month - left thigh discomfort - involves - left hip, some low back and inside leg.  Walking on treadmill aggravates.  Taking advil and tylenol.  Did get a new mattress.  Is some better today.  No chest pain.  Breathing stable.  No acid reflux reported.  No abdominal pain.  Bowels stable.    Past Medical History:  Diagnosis Date   Asymptomatic cholelithiasis    Hypertension    Mammographic microcalcification 2015   Thrombocytopenia (HCC)    Recent platelet count 151 on 11/01/08   Tobacco use    Past Surgical History:  Procedure Laterality Date   BREAST BIOPSY Left 2012   NEG   KNEE SURGERY Left 3/11   left knee    Family History  Problem Relation Age of Onset   Hypertension Mother    Diabetes Mother    Hypothyroidism Mother    Heart disease Father        Died of a Heart Attack    Breast cancer Neg Hx    Social History   Socioeconomic History   Marital status: Single    Spouse name: Not on file   Number of children: Not on file   Years of education: Not on file   Highest education level: Not on file  Occupational History   Not on file  Tobacco Use   Smoking status: Former    Packs/day: 0.25    Years: 12.00    Pack years: 3.00    Types: Cigarettes    Smokeless tobacco: Never   Tobacco comments:    Quit March 2016  Substance and Sexual Activity   Alcohol use: Yes    Alcohol/week: 0.0 standard drinks    Comment: occasionally   Drug use: No   Sexual activity: Not on file  Other Topics Concern   Not on file  Social History Narrative   Not on file   Social Determinants of Health   Financial Resource Strain: Low Risk    Difficulty of Paying Living Expenses: Not very hard  Food Insecurity: Not on file  Transportation Needs: Not on file  Physical Activity: Not on file  Stress: Not on file  Social Connections: Not on file    Review of Systems  Constitutional:  Negative for fever.       Has adjusted diet.  Lost weight.    HENT:  Negative for congestion and sinus pressure.   Respiratory:  Negative for cough, chest tightness and shortness of breath.   Cardiovascular:  Negative for chest pain, palpitations and leg swelling.  Gastrointestinal:  Negative for abdominal pain, diarrhea, nausea and vomiting.  Genitourinary:  Negative for difficulty urinating and dysuria.  Musculoskeletal:  Negative for joint swelling and  myalgias.  Skin:  Negative for color change and rash.  Neurological:  Negative for dizziness, light-headedness and headaches.  Psychiatric/Behavioral:  Negative for agitation and dysphoric mood.       Objective:    Physical Exam Vitals reviewed.  Constitutional:      General: She is not in acute distress.    Appearance: Normal appearance.  HENT:     Head: Normocephalic and atraumatic.     Right Ear: External ear normal.     Left Ear: External ear normal.  Eyes:     General: No scleral icterus.       Right eye: No discharge.        Left eye: No discharge.     Conjunctiva/sclera: Conjunctivae normal.  Neck:     Thyroid: No thyromegaly.  Cardiovascular:     Rate and Rhythm: Normal rate and regular rhythm.  Pulmonary:     Effort: No respiratory distress.     Breath sounds: Normal breath sounds. No wheezing.   Abdominal:     General: Bowel sounds are normal.     Palpations: Abdomen is soft.     Tenderness: There is no abdominal tenderness.  Musculoskeletal:        General: No swelling or tenderness.     Cervical back: Neck supple. No tenderness.  Lymphadenopathy:     Cervical: No cervical adenopathy.  Skin:    Findings: No erythema or rash.  Neurological:     Mental Status: She is alert.  Psychiatric:        Mood and Affect: Mood normal.        Behavior: Behavior normal.    BP 116/68   Pulse 72   Temp (!) 97 F (36.1 C)   Resp 16   Ht 5' 9"  (1.753 m)   Wt (!) 411 lb (186.4 kg)   LMP 12/29/2012   SpO2 98%   BMI 60.69 kg/m  Wt Readings from Last 3 Encounters:  06/06/21 (!) 411 lb (186.4 kg)  03/28/21 (!) 423 lb (191.9 kg)  01/21/21 (!) 416 lb (188.7 kg)    Outpatient Encounter Medications as of 06/06/2021  Medication Sig   acetaminophen (TYLENOL) 500 MG tablet Take 500 mg by mouth every 6 (six) hours as needed.   aspirin EC 81 MG tablet Take 81 mg by mouth daily. Swallow whole.   KLOR-CON M10 10 MEQ tablet TAKE 1 TABLET BY MOUTH EVERY DAY   metFORMIN (GLUCOPHAGE-XR) 500 MG 24 hr tablet TAKE 1 TABLET BY MOUTH EVERY DAY WITH BREAKFAST   methylPREDNISolone (MEDROL DOSEPAK) 4 MG TBPK tablet Medrol dosepack - 6 day taper.  Take as directed.   rosuvastatin (CRESTOR) 10 MG tablet TAKE 1 TABLET BY MOUTH EVERY DAY   Semaglutide,0.25 or 0.5MG/DOS, (OZEMPIC, 0.25 OR 0.5 MG/DOSE,) 2 MG/1.5ML SOPN Inject 0.5 mg into the skin once a week.   triamterene-hydrochlorothiazide (MAXZIDE-25) 37.5-25 MG tablet Take 1 tablet by mouth daily.   [DISCONTINUED] nystatin cream (MYCOSTATIN) Apply 1 application topically 2 (two) times daily. Apply to affected area on side. (Patient not taking: Reported on 06/06/2021)   [DISCONTINUED] potassium chloride (K-DUR) 10 MEQ tablet TAKE 1 TABLET EVERY DAY   [DISCONTINUED] triamcinolone cream (KENALOG) 0.1 % Apply 1 application topically 2 (two) times daily.  (Patient not taking: Reported on 06/06/2021)   No facility-administered encounter medications on file as of 06/06/2021.     Lab Results  Component Value Date   WBC 5.5 05/28/2021   HGB 13.1 05/28/2021   HCT  40.0 05/28/2021   PLT 134 (L) 05/28/2021   GLUCOSE 106 (H) 05/28/2021   CHOL 164 05/28/2021   TRIG 103 05/28/2021   HDL 61 05/28/2021   LDLDIRECT 140.7 07/13/2013   LDLCALC 84 05/28/2021   ALT 16 05/28/2021   AST 13 05/28/2021   NA 142 05/28/2021   K 4.2 05/28/2021   CL 103 05/28/2021   CREATININE 0.95 05/28/2021   BUN 12 05/28/2021   CO2 24 05/28/2021   TSH 0.898 05/28/2021   HGBA1C 6.3 (H) 05/28/2021   MICROALBUR 0.9 01/21/2021    MM 3D SCREEN BREAST BILATERAL  Result Date: 02/05/2021 CLINICAL DATA:  Screening. EXAM: DIGITAL SCREENING BILATERAL MAMMOGRAM WITH TOMOSYNTHESIS AND CAD TECHNIQUE: Bilateral screening digital craniocaudal and mediolateral oblique mammograms were obtained. Bilateral screening digital breast tomosynthesis was performed. The images were evaluated with computer-aided detection. COMPARISON:  Previous exam(s). ACR Breast Density Category a: The breast tissue is almost entirely fatty. FINDINGS: There are no findings suspicious for malignancy. The images were evaluated with computer-aided detection. IMPRESSION: No mammographic evidence of malignancy. A result letter of this screening mammogram will be mailed directly to the patient. RECOMMENDATION: Screening mammogram in one year. (Code:SM-B-01Y) BI-RADS CATEGORY  1: Negative. Electronically Signed   By: Audie Pinto M.D.   On: 02/05/2021 10:15       Assessment & Plan:   Problem List Items Addressed This Visit     Essential hypertension, benign    Continue triam/hctz.  Follow pressures.  No changes made.  Follow metabolic panel.        Relevant Orders   Basic metabolic panel   Hypercholesteremia    Continue crestor.  Low cholesterol diet and exercise.  Follow lipid panel and liver function  tests.         Relevant Orders   Hepatic function panel   Lipid panel   Left thigh pain    Left hip/thigh/leg pain.  Tylenol as directed.  Follow.  Notify me if persistent problems.         Thrombocytopenia (HCC)    Platelet count 134 on last check.  Still slightly decreased, but improved from last check.  Follow cbc.        Relevant Orders   CBC with Differential/Platelet   Type 2 diabetes mellitus with hyperglycemia ([place])    On ozempic.  Weight down 12 pounds.  Watching her diet.  Discussed diet and exercise.  Follow met b and a1c.        Relevant Orders   Hemoglobin A1c     Einar Pheasant, MD

## 2021-06-08 DIAGNOSIS — M79652 Pain in left thigh: Secondary | ICD-10-CM | POA: Insufficient documentation

## 2021-06-08 NOTE — Assessment & Plan Note (Signed)
Continue triam/hctz.  Follow pressures.  No changes made.  Follow metabolic panel.  

## 2021-06-08 NOTE — Assessment & Plan Note (Signed)
On ozempic.  Weight down 12 pounds.  Watching her diet.  Discussed diet and exercise.  Follow met b and a1c.

## 2021-06-08 NOTE — Assessment & Plan Note (Signed)
Continue crestor.  Low cholesterol diet and exercise. Follow lipid panel and liver function tests.   

## 2021-06-08 NOTE — Assessment & Plan Note (Signed)
Platelet count 134 on last check.  Still slightly decreased, but improved from last check.  Follow cbc.

## 2021-06-08 NOTE — Assessment & Plan Note (Signed)
Left hip/thigh/leg pain.  Tylenol as directed.  Follow.  Notify me if persistent problems.

## 2021-06-24 ENCOUNTER — Other Ambulatory Visit: Payer: Self-pay | Admitting: Internal Medicine

## 2021-06-24 DIAGNOSIS — E119 Type 2 diabetes mellitus without complications: Secondary | ICD-10-CM

## 2021-07-06 ENCOUNTER — Other Ambulatory Visit: Payer: Self-pay | Admitting: Internal Medicine

## 2021-07-23 ENCOUNTER — Telehealth: Payer: Self-pay | Admitting: Pharmacist

## 2021-07-23 ENCOUNTER — Telehealth: Payer: Managed Care, Other (non HMO)

## 2021-07-23 NOTE — Telephone Encounter (Signed)
  Chronic Care Management   Note  07/23/2021 Name: Morgan Duncan MRN: [id number] DOB: July 18, 1961   Attempted to contact patient for scheduled appointment for medication management support. Left HIPAA compliant message for patient to return my call at their convenience.    Plan: - If I do not hear back from the patient by end of business today, will collaborate with Care Guide to outreach to schedule follow up with me  [name], PharmD, [employer], CPP Clinical Pharmacist [facility] [employer] [phone]

## 2021-07-29 NOTE — Telephone Encounter (Signed)
Patient has been rescheduled. She requested a sooner appt than 4 weeks so I scheduled for 08/08/2021

## 2021-08-01 ENCOUNTER — Other Ambulatory Visit: Payer: Self-pay | Admitting: Internal Medicine

## 2021-08-08 ENCOUNTER — Telehealth: Payer: Managed Care, Other (non HMO)

## 2021-08-08 ENCOUNTER — Telehealth: Payer: Self-pay | Admitting: Pharmacist

## 2021-08-08 NOTE — Telephone Encounter (Signed)
  Chronic Care Management   Note  08/08/2021 Name: LYANNE KATES MRN: [id number] DOB: 05-Nov-1961   Attempted to contact patient for scheduled appointment for medication management support. Left HIPAA compliant message for patient to return my call at their convenience.    Plan: - If I do not hear back from the patient by end of business today, will collaborate with Care Guide to outreach to schedule follow up with me   [name], PharmD, [place], CPP Clinical Pharmacist [facility] [employer] [phone]

## 2021-08-11 ENCOUNTER — Telehealth: Payer: Self-pay

## 2021-08-11 NOTE — Chronic Care Management (AMB) (Signed)
  Care Management   Note  08/11/2021 Name: Morgan Duncan MRN: [id number] DOB: February 06, 1961  Morgan Duncan is a 60 y.o. year old female who is a primary care patient of Dale Friendly, MD and is actively engaged with the care management team. I reached out to Michaela Corner by phone today to assist with re-scheduling a follow up visit with the Pharmacist  Follow up plan: Unsuccessful telephone outreach attempt made. A HIPAA compliant phone message was left for the patient providing contact information and requesting a return call.  The care management team will reach out to the patient again over the next 7 days.  If patient returns call to provider office, please advise to call Embedded Care Management Care Guide Penne Lash  at [phone]  Penne Lash, RMA Care Guide, Embedded Care Coordination [facility]  [place], [place] [address] Direct Dial: [phone] [email or web address]: [email or web address]

## 2021-08-12 NOTE — Telephone Encounter (Signed)
Pt has been rescheduled. 

## 2021-08-12 NOTE — Chronic Care Management (AMB) (Signed)
  Care Management   Note  08/12/2021 Name: Morgan Duncan MRN: [id number] DOB: 1961-03-01  Morgan Duncan is a 60 y.o. year old female who is a primary care patient of Dale [place], MD and is actively engaged with the care management team. I reached out to Michaela Corner by phone today to assist with re-scheduling a follow up visit with the Pharmacist  Follow up plan: Telephone appointment with care management team member scheduled for:09/12/2021   Penne Lash, RMA Care Guide, Embedded Care Coordination [facility]  [place], [place] [address] Direct Dial: [phone] [email or web address]

## 2021-09-03 ENCOUNTER — Other Ambulatory Visit: Payer: Self-pay

## 2021-09-03 ENCOUNTER — Encounter: Payer: Self-pay | Admitting: Internal Medicine

## 2021-09-03 ENCOUNTER — Telehealth: Payer: Self-pay

## 2021-09-03 ENCOUNTER — Ambulatory Visit (INDEPENDENT_AMBULATORY_CARE_PROVIDER_SITE_OTHER): Payer: Managed Care, Other (non HMO) | Admitting: Internal Medicine

## 2021-09-03 VITALS — BP 116/68 | HR 80 | Temp 97.9°F | Resp 16 | Ht 69.5 in | Wt >= 6400 oz

## 2021-09-03 DIAGNOSIS — R002 Palpitations: Secondary | ICD-10-CM

## 2021-09-03 DIAGNOSIS — D696 Thrombocytopenia, unspecified: Secondary | ICD-10-CM

## 2021-09-03 DIAGNOSIS — I10 Essential (primary) hypertension: Secondary | ICD-10-CM

## 2021-09-03 DIAGNOSIS — Z860100 Personal history of colon polyps, unspecified: Secondary | ICD-10-CM

## 2021-09-03 DIAGNOSIS — R12 Heartburn: Secondary | ICD-10-CM

## 2021-09-03 DIAGNOSIS — Z23 Encounter for immunization: Secondary | ICD-10-CM | POA: Diagnosis not present

## 2021-09-03 DIAGNOSIS — E1165 Type 2 diabetes mellitus with hyperglycemia: Secondary | ICD-10-CM | POA: Diagnosis not present

## 2021-09-03 DIAGNOSIS — E7800 Pure hypercholesterolemia, unspecified: Secondary | ICD-10-CM

## 2021-09-03 DIAGNOSIS — Z720 Tobacco use: Secondary | ICD-10-CM

## 2021-09-03 NOTE — Telephone Encounter (Signed)
Letter printed for labs. 

## 2021-09-03 NOTE — Assessment & Plan Note (Signed)
Continue crestor.  Low cholesterol diet and exercise. Follow lipid panel and liver function tests.   

## 2021-09-03 NOTE — Assessment & Plan Note (Addendum)
On ozempic (.66m).   Low carb diet and exercise.  Follow met b and a1c. Has lost more weight.

## 2021-09-03 NOTE — Assessment & Plan Note (Signed)
Last platelet count 134.  Recheck today to confirm stable.

## 2021-09-03 NOTE — Progress Notes (Signed)
Patient ID: Morgan Duncan, female   DOB: August 31, 1961, 60 y.o.   MRN: [id number]   Subjective:    Patient ID: Morgan Duncan, female    DOB: 24-Sep-1961, 60 y.o.   MRN: [id number]  This visit occurred during the SARS-CoV-2 public health emergency.  Safety protocols were in place, including screening questions prior to the visit, additional usage of staff PPE, and extensive cleaning of exam room while observing appropriate contact time as indicated for disinfecting solutions.   Patient here for a scheduled follow up.   Chief Complaint  Patient presents with   Diabetes   Hypertension   Hyperlipidemia    .   Diabetes Pertinent negatives for hypoglycemia include no dizziness or headaches. Pertinent negatives for diabetes include no chest pain.  Hypertension Pertinent negatives include no chest pain, headaches, palpitations or shortness of breath.  Hyperlipidemia Pertinent negatives include no chest pain, myalgias or shortness of breath.  Here to follow up regarding her blood pressure, blood sugar and weight loss.  On ozempic.  Weight is down 10 more pounds.  Not able to exercise - does not have access currently to the gym.  She does walk at work 2x/day.  Discussed diet and exercise.  No chest pain or sob reported.  With increased stress will notice increased heart rate/palpitations.  Her sister is planning to have an ablation.  She request referral to cardiology for evaluation - given symptoms and family history.  No abdominal pain or bowel change reported.  May have a bowel movement 1 q 2-3 days.  Discussed fiber.  Also reports noticing episodes - sour stomach.  May occur 3-4x/month and may last 2-3 hours.  May be worse after eating spicy foods.  Handling stress.  Has good support.     Past Medical History:  Diagnosis Date   Asymptomatic cholelithiasis    Hypertension    Mammographic microcalcification 2015   Thrombocytopenia (HCC)    Recent platelet count 151 on 11/01/08   Tobacco use     Past Surgical History:  Procedure Laterality Date   BREAST BIOPSY Left 2012   NEG   KNEE SURGERY Left 3/11   left knee    Family History  Problem Relation Age of Onset   Hypertension Mother    Diabetes Mother    Hypothyroidism Mother    Heart disease Father        Died of a Heart Attack    Breast cancer Neg Hx    Social History   Socioeconomic History   Marital status: Single    Spouse name: Not on file   Number of children: Not on file   Years of education: Not on file   Highest education level: Not on file  Occupational History   Not on file  Tobacco Use   Smoking status: Former    Packs/day: 0.25    Years: 12.00    Pack years: 3.00    Types: Cigarettes   Smokeless tobacco: Never   Tobacco comments:    Quit March 2016  Substance and Sexual Activity   Alcohol use: Yes    Alcohol/week: 0.0 standard drinks    Comment: occasionally   Drug use: No   Sexual activity: Not on file  Other Topics Concern   Not on file  Social History Narrative   Not on file   Social Determinants of Health   Financial Resource Strain: Low Risk    Difficulty of Paying Living Expenses: Not very hard  Food Insecurity:  Not on file  Transportation Needs: Not on file  Physical Activity: Not on file  Stress: Not on file  Social Connections: Not on file    Review of Systems  Constitutional:  Negative for appetite change and unexpected weight change.  HENT:  Negative for congestion and sinus pressure.   Respiratory:  Negative for cough, chest tightness and shortness of breath.   Cardiovascular:  Negative for chest pain, palpitations and leg swelling.  Gastrointestinal:  Negative for abdominal pain, diarrhea, nausea and vomiting.  Genitourinary:  Negative for difficulty urinating and dysuria.  Musculoskeletal:  Negative for joint swelling and myalgias.  Skin:  Negative for color change and rash.  Neurological:  Negative for dizziness and headaches.  Psychiatric/Behavioral:   Negative for agitation and dysphoric mood.       Objective:     BP 116/68   Pulse 80   Temp 97.9 F (36.6 C)   Resp 16   Ht 5' 9.5" (1.765 m)   Wt (!) 401 lb (181.9 kg)   LMP 12/29/2012   SpO2 99%   BMI 58.37 kg/m  Wt Readings from Last 3 Encounters:  09/03/21 (!) 401 lb (181.9 kg)  06/06/21 (!) 411 lb (186.4 kg)  03/28/21 (!) 423 lb (191.9 kg)    Physical Exam Vitals reviewed.  Constitutional:      General: She is not in acute distress.    Appearance: Normal appearance.  HENT:     Head: Normocephalic and atraumatic.     Right Ear: External ear normal.     Left Ear: External ear normal.  Eyes:     General: No scleral icterus.       Right eye: No discharge.        Left eye: No discharge.     Conjunctiva/sclera: Conjunctivae normal.  Neck:     Thyroid: No thyromegaly.  Cardiovascular:     Rate and Rhythm: Normal rate and regular rhythm.  Pulmonary:     Effort: No respiratory distress.     Breath sounds: Normal breath sounds. No wheezing.  Abdominal:     General: Bowel sounds are normal.     Palpations: Abdomen is soft.     Tenderness: There is no abdominal tenderness.  Musculoskeletal:        General: No swelling or tenderness.     Cervical back: Neck supple. No tenderness.  Lymphadenopathy:     Cervical: No cervical adenopathy.  Skin:    Findings: No erythema or rash.  Neurological:     Mental Status: She is alert.  Psychiatric:        Mood and Affect: Mood normal.        Behavior: Behavior normal.     Outpatient Encounter Medications as of 09/03/2021  Medication Sig   acetaminophen (TYLENOL) 500 MG tablet Take 500 mg by mouth every 6 (six) hours as needed.   aspirin EC 81 MG tablet Take 81 mg by mouth daily. Swallow whole.   KLOR-CON M10 10 MEQ tablet TAKE 1 TABLET BY MOUTH EVERY DAY   metFORMIN (GLUCOPHAGE-XR) 500 MG 24 hr tablet TAKE 1 TABLET BY MOUTH EVERY DAY WITH BREAKFAST   OZEMPIC, 0.25 OR 0.5 MG/DOSE, 2 MG/1.5ML SOPN INJECT 0.5 MG INTO THE  SKIN ONCE A WEEK.   rosuvastatin (CRESTOR) 10 MG tablet TAKE 1 TABLET BY MOUTH EVERY DAY   triamterene-hydrochlorothiazide (MAXZIDE-25) 37.5-25 MG tablet TAKE 1 TABLET BY MOUTH EVERY DAY   [DISCONTINUED] methylPREDNISolone (MEDROL DOSEPAK) 4 MG TBPK tablet Medrol dosepack -  6 day taper.  Take as directed.   [DISCONTINUED] potassium chloride (K-DUR) 10 MEQ tablet TAKE 1 TABLET EVERY DAY   No facility-administered encounter medications on file as of 09/03/2021.     Lab Results  Component Value Date   WBC 5.5 05/28/2021   HGB 13.1 05/28/2021   HCT 40.0 05/28/2021   PLT 134 (L) 05/28/2021   GLUCOSE 106 (H) 05/28/2021   CHOL 164 05/28/2021   TRIG 103 05/28/2021   HDL 61 05/28/2021   LDLDIRECT 140.7 07/13/2013   LDLCALC 84 05/28/2021   ALT 16 05/28/2021   AST 13 05/28/2021   NA 142 05/28/2021   K 4.2 05/28/2021   CL 103 05/28/2021   CREATININE 0.95 05/28/2021   BUN 12 05/28/2021   CO2 24 05/28/2021   TSH 0.898 05/28/2021   HGBA1C 6.3 (H) 05/28/2021   MICROALBUR 0.9 01/21/2021    MM 3D SCREEN BREAST BILATERAL  Result Date: 02/05/2021 CLINICAL DATA:  Screening. EXAM: DIGITAL SCREENING BILATERAL MAMMOGRAM WITH TOMOSYNTHESIS AND CAD TECHNIQUE: Bilateral screening digital craniocaudal and mediolateral oblique mammograms were obtained. Bilateral screening digital breast tomosynthesis was performed. The images were evaluated with computer-aided detection. COMPARISON:  Previous exam(s). ACR Breast Density Category a: The breast tissue is almost entirely fatty. FINDINGS: There are no findings suspicious for malignancy. The images were evaluated with computer-aided detection. IMPRESSION: No mammographic evidence of malignancy. A result letter of this screening mammogram will be mailed directly to the patient. RECOMMENDATION: Screening mammogram in one year. (Code:SM-B-01Y) BI-RADS CATEGORY  1: Negative. Electronically Signed   By: Audie Pinto M.D.   On: 02/05/2021 10:15       Assessment  & Plan:   Problem List Items Addressed This Visit     Essential hypertension, benign    Continue triam/hctz.  Follow pressures.  No changes made.  Follow metabolic panel.       History of colonic polyps    Overdue colonoscopy.  Agreeable now.  Refer to GI.       Relevant Orders   Ambulatory referral to Gastroenterology   Hypercholesteremia    Continue crestor.  Low cholesterol diet and exercise.  Follow lipid panel and liver function tests.        Palpitations    Persistent intermittent episodes.  Request referral to cardiology.  Sister is scheduled to have an ablation.       Relevant Orders   Ambulatory referral to Cardiology   Severe obesity (BMI >= 40) ([place])    Has lost weight.  BMI improved.  Continue ozempic.  Follow.       Stomach acid    Reports sour stomach as outlined.  Presumed acid.  Avoid foods that aggravate.  Consider pepcid.  Follow.        Thrombocytopenia (HCC)    Last platelet count 134.  Recheck today to confirm stable.       Tobacco abuse    Not smoking now.  Follow.       Type 2 diabetes mellitus with hyperglycemia (HCC)    On ozempic (.37m).   Low carb diet and exercise.  Follow met b and a1c. Has lost more weight.        Other Visit Diagnoses     Need for pneumococcal vaccination    -  Primary   Relevant Orders   Pneumococcal conjugate vaccine 20-valent (Prevnar 20) (Completed)        CEinar Pheasant MD

## 2021-09-04 ENCOUNTER — Telehealth: Payer: Self-pay

## 2021-09-04 NOTE — Chronic Care Management (AMB) (Signed)
  Care Management   Note  09/04/2021 Name: Morgan Duncan MRN: [id number] DOB: 1961-05-06  Morgan Duncan is a 60 y.o. year old female who is a primary care patient of Dale Myrtle, MD and is actively engaged with the care management team. I reached out to Michaela Corner by phone today to assist with re-scheduling a follow up visit with the Pharmacist  Follow up plan: Unsuccessful telephone outreach attempt made. A HIPAA compliant phone message was left for the patient providing contact information and requesting a return call.  The care management team will reach out to the patient again over the next 5 days.  If patient returns call to provider office, please advise to call Embedded Care Management Care Guide Penne Lash  at [phone]  Penne Lash, RMA Care Guide, Embedded Care Coordination [facility]  [place], [place] [address] Direct Dial: [phone] [email or web address]

## 2021-09-08 ENCOUNTER — Ambulatory Visit: Payer: Managed Care, Other (non HMO) | Admitting: Internal Medicine

## 2021-09-12 ENCOUNTER — Telehealth: Payer: Managed Care, Other (non HMO)

## 2021-09-14 DIAGNOSIS — R12 Heartburn: Secondary | ICD-10-CM | POA: Insufficient documentation

## 2021-09-14 NOTE — Assessment & Plan Note (Signed)
Persistent intermittent episodes.  Request referral to cardiology.  Sister is scheduled to have an ablation.

## 2021-09-14 NOTE — Assessment & Plan Note (Signed)
Reports sour stomach as outlined.  Presumed acid.  Avoid foods that aggravate.  Consider pepcid.  Follow.

## 2021-09-14 NOTE — Assessment & Plan Note (Signed)
Continue triam/hctz.  Follow pressures.  No changes made.  Follow metabolic panel.  

## 2021-09-14 NOTE — Assessment & Plan Note (Signed)
Has lost weight.  BMI improved.  Continue ozempic.  Follow.

## 2021-09-14 NOTE — Assessment & Plan Note (Signed)
Overdue colonoscopy.  Agreeable now.  Refer to GI.

## 2021-09-14 NOTE — Assessment & Plan Note (Addendum)
Not smoking now.  Follow.

## 2021-09-25 NOTE — Chronic Care Management (AMB) (Signed)
  Care Management   Note  09/25/2021 Name: [name] MRN: [id number] DOB: 06/21/1961  Morgan Duncan is a 60 y.o. year old female who is a primary care patient of Dale McNary, MD and is actively engaged with the care management team. I reached out to Michaela Corner by phone today to assist with re-scheduling a follow up visit with the Pharmacist  Follow up plan: Unsuccessful telephone outreach attempt made. A HIPAA compliant phone message was left for the patient providing contact information and requesting a return call.  The care management team will reach out to the patient again over the next 7 days.  If patient returns call to provider office, please advise to call Embedded Care Management Care Guide Penne Lash  at [phone]  Penne Lash, RMA Care Guide, Embedded Care Coordination [facility]  [place], [place] [address] Direct Dial: [phone] [email or web address] Website: [email or web address]

## 2021-10-01 NOTE — Chronic Care Management (AMB) (Signed)
  Care Management   Note  10/01/2021 Name: Morgan Duncan MRN: [id number] DOB: [birth year]  Morgan Duncan is a 60 y.o. year old female who is a primary care patient of Dale [place], MD and is actively engaged with the care management team. I reached out to Morgan Duncan by phone today to assist with re-scheduling a follow up visit with the Pharmacist  Follow up plan: Unable to make contact on outreach attempts x 3. PCP Dale [place], MD notified via routed documentation in medical record.   Morgan Duncan, RMA Care Guide, Embedded Care Coordination [facility]  [place], [place] [address] Direct Dial: [phone] [email or web address]: [email or web address]

## 2021-10-07 ENCOUNTER — Telehealth: Payer: Self-pay

## 2021-10-07 NOTE — Telephone Encounter (Signed)
CALLED PATIENT NO ANSWER LEFT VOICEMAIL FOR A CALL BACK LETTER SENT 

## 2021-10-15 ENCOUNTER — Telehealth: Payer: Self-pay

## 2021-10-15 NOTE — Telephone Encounter (Signed)
Returned patients call. LVM to call office back. 

## 2021-10-26 ENCOUNTER — Other Ambulatory Visit: Payer: Self-pay | Admitting: Internal Medicine

## 2021-11-17 ENCOUNTER — Other Ambulatory Visit: Payer: Self-pay | Admitting: Internal Medicine

## 2021-11-18 ENCOUNTER — Telehealth: Payer: Self-pay | Admitting: Pharmacist

## 2021-11-18 ENCOUNTER — Ambulatory Visit: Payer: Managed Care, Other (non HMO) | Admitting: Pharmacist

## 2021-11-18 DIAGNOSIS — E1165 Type 2 diabetes mellitus with hyperglycemia: Secondary | ICD-10-CM

## 2021-11-18 DIAGNOSIS — E7800 Pure hypercholesterolemia, unspecified: Secondary | ICD-10-CM

## 2021-11-18 DIAGNOSIS — I10 Essential (primary) hypertension: Secondary | ICD-10-CM

## 2021-11-18 MED ORDER — ROSUVASTATIN CALCIUM 10 MG PO TABS: 10.0000 mg | ORAL_TABLET | Freq: Every day | ORAL | 1 refills | Status: DC

## 2021-11-18 MED ORDER — OZEMPIC (1 MG/DOSE) 4 MG/3ML ~~LOC~~ SOPN
1.0000 mg | PEN_INJECTOR | SUBCUTANEOUS | 1 refills | Status: DC
Start: 2021-11-18 — End: 2021-11-25

## 2021-11-18 MED ORDER — POTASSIUM CHLORIDE CRYS ER 10 MEQ PO TBCR: 10.0000 meq | EXTENDED_RELEASE_TABLET | Freq: Every day | ORAL | 1 refills | Status: DC

## 2021-11-18 NOTE — Telephone Encounter (Signed)
PA required for Ozempic 1 mg. Submitted today (Key [id number])

## 2021-11-18 NOTE — Chronic Care Management (AMB) (Signed)
Chronic Care Management CCM Pharmacy Note  11/18/2021 Name:  Morgan Duncan MRN:  [id number] DOB:  [birth year]  Summary: - Tolerating regimen well  Recommendations/Changes made from today's visit: - Increase Ozempic to 1 mg weekly - Consider increasing rosuvastatin to target LDL <70 given risk factors  Subjective: Morgan Duncan is an 61 y.o. year old female who is a primary patient of Morgan [place], MD.  The CCM team was consulted for assistance with disease management and care coordination needs.    Engaged with patient by telephone for follow up visit for pharmacy case management and/or care coordination services.   Objective:  Medications Reviewed Today     Reviewed by Morgan Duncan, RPH-CPP (Pharmacist) on 11/18/21 at 1420  Med List Status: <None>   Medication Order Taking? Sig Documenting Provider Last Dose Status Informant  acetaminophen (TYLENOL) 500 MG tablet [id number] Yes Take 500 mg by mouth every 6 (six) hours as needed. [provider] Taking Active   aspirin EC 81 MG tablet [id number] Yes Take 81 mg by mouth daily. Swallow whole. [provider] Taking Active   KLOR-CON M10 10 MEQ tablet [id number] Yes TAKE 1 TABLET BY MOUTH EVERY DAY Morgan [place], MD Taking Active   metFORMIN (GLUCOPHAGE-XR) 500 MG 24 hr tablet [id number] Yes TAKE 1 TABLET BY MOUTH EVERY DAY WITH BREAKFAST Morgan [place], MD Taking Active   OZEMPIC, 0.25 OR 0.5 MG/DOSE, 2 MG/1.5ML SOPN [id number] Yes INJECT 0.5 MG INTO THE SKIN ONCE A WEEK. Morgan [place], MD Taking Active     Discontinued 04/16/14 [phone] (Reorder)   rosuvastatin (CRESTOR) 10 MG tablet [id number] Yes TAKE 1 TABLET BY MOUTH EVERY DAY Morgan [place], MD Taking Active   triamterene-hydrochlorothiazide (MAXZIDE-25) 37.5-25 MG tablet [id number] Yes TAKE 1 TABLET BY MOUTH EVERY DAY Morgan [place], MD Taking Active             Pertinent Labs:   Lab Results  Component Value Date   HGBA1C 6.3 (H) 05/28/2021    Lab Results  Component Value Date   CHOL 164 05/28/2021   HDL 61 05/28/2021   LDLCALC 84 05/28/2021   LDLDIRECT 140.7 07/13/2013   TRIG 103 05/28/2021   CHOLHDL 2.7 05/28/2021   Lab Results  Component Value Date   CREATININE 0.95 05/28/2021   BUN 12 05/28/2021   NA 142 05/28/2021   K 4.2 05/28/2021   CL 103 05/28/2021   CO2 24 05/28/2021    SDOH:  (Social Determinants of Health) assessments and interventions performed:    CCM Care Plan  Review of patient past medical history, allergies, medications, health status, including review of consultants reports, laboratory and other test data, was performed as part of comprehensive evaluation and provision of chronic care management services.   Care Plan : Medication Management  Updates made by Morgan Duncan, RPH-CPP since 11/18/2021 12:00 AM     Problem: Obesity, Diabetes, Hypertension, Hyperlipidemia      Long-Range Goal: Disease Progression Prevention   Recent Progress: On track  Priority: High  Note:   Current Barriers:  Unable to achieve control of weight   Pharmacist Clinical Goal(s):  Over the next 90 days, patient will achieve 5-10% reduction in body weight through collaboration with PharmD and provider.   Interventions: 1:1 collaboration with Morgan , MD regarding development and update of comprehensive plan of care as evidenced by provider attestation and co-signature Inter-disciplinary care team collaboration (see longitudinal plan of care) Comprehensive medication review performed; medication list updated in electronic  medical record  Health Maintenance   Yearly diabetic eye exam: due Yearly diabetic foot exam: due - placed reminder in upcoming visit ntoes Urine microalbumin: up to date Yearly influenza vaccination: due - patient declined Td/Tdap vaccination: due Pneumonia vaccination: up to date COVID vaccinations: due - recommend bivalent booster Shingrix vaccinations: due Colonoscopy:  due - patient declined Mammogram: up to date  Obesity, Diabetes: A1c controlled, but unable to independently manage weight; current treatment: metformin XR 500 mg daily, Ozempic 0.5 mg weekly Denies tolerability concerns with Ozempic therapy  Baseline weight: 424 lbs; most recent weight: 401 lbs (23 lbs lost, 5% weight loss from baseline) Praised for improvement in weight. Discussed increasing dose to 1 mg weekly. Patient amenable. Increase to 1 mg weekly. Continue metformin XR 500 mg daily. Discussed potential risk of increased GI upset with dose increase   Hypertension: Controlled per last clinic reading; current treatment: triamterene/HCTZ 37.5/25 mg daily, potassium 10 mEq daily Patient reports needing refill on potassium Recommended to continue current regimen at this time  Hyperlipidemia: Uncontrolled given DM + risk factors of CKD, family history of ASCVD (Father died from MI in his 8s, brother died from MI in his 65s); current treatment: rosuvastatin 10 mg daily  Hx tobacco use, quit ~7 years ago Antiplatelet regimen: aspirin 81 mg daily  Recommend goal LDL <70. Will discuss increasing rosuvastatin to 20 mg daily moving forward.   Patient Goals/Self-Care Activities Over the next 90 days, patient will:  - take medications as prescribed collaborate with provider on medication access solutions target a minimum of 150 minutes of moderate intensity exercise weekly engage in dietary modifications by incorporating healthy proteins, vegetables, fruits, reducing quick carb meals      Plan: Telephone follow up appointment with care management team member scheduled for:  10 weeks  [name], PharmD, [place], CPP Clinical Pharmacist [employer] at [employer] [phone]

## 2021-11-18 NOTE — Patient Instructions (Signed)
Tiarah,   It was great talking to you today!  Increase Ozempic to 1 mg weekly. Continue metformin XR 500 mg daily.   Please schedule your yearly diabetic eye exam. Have those results sent to our office.   We recommend you get the influenza vaccine for this season.   We recommend you get the updated bivalent COVID-19 booster, at least 2 months after any prior doses. You may consider delaying a booster dose by 3 months from a prior episode of COVID-19 per the CDC.   You can find pharmacies that have this formulation in stock at [email or web address].   Take care!  [name], PharmD

## 2021-11-19 ENCOUNTER — Other Ambulatory Visit: Payer: Self-pay | Admitting: Internal Medicine

## 2021-11-19 DIAGNOSIS — E1165 Type 2 diabetes mellitus with hyperglycemia: Secondary | ICD-10-CM

## 2021-11-19 NOTE — Telephone Encounter (Signed)
Patient is already on metformin therapy. PA submitted for GLP1

## 2021-11-25 MED ORDER — OZEMPIC (1 MG/DOSE) 4 MG/3ML ~~LOC~~ SOPN: 1.0000 mg | PEN_INJECTOR | SUBCUTANEOUS | 1 refills | Status: DC

## 2021-11-25 NOTE — Addendum Note (Signed)
Addended by: Lourena Simmonds on: 11/25/2021 08:15 AM   Modules accepted: Orders

## 2021-11-25 NOTE — Telephone Encounter (Signed)
PA for Ozempic approved 11/18/2021-11/18/2022.

## 2021-12-26 ENCOUNTER — Other Ambulatory Visit: Payer: Self-pay | Admitting: Internal Medicine

## 2021-12-26 DIAGNOSIS — Z1231 Encounter for screening mammogram for malignant neoplasm of breast: Secondary | ICD-10-CM

## 2021-12-29 ENCOUNTER — Telehealth: Payer: Self-pay

## 2021-12-29 NOTE — Telephone Encounter (Signed)
CALLED NO ANSWER VOICEMAIL WAS FULL

## 2021-12-31 ENCOUNTER — Telehealth: Payer: Self-pay

## 2021-12-31 NOTE — Telephone Encounter (Signed)
PATIENT CALLED AND LEFT MESSAGE I CALLED BACK AND LEFT VOICEMAIL FOR HER

## 2022-01-02 LAB — COMPREHENSIVE METABOLIC PANEL
Albumin: 4.5 (ref 3.5–5.0)
Calcium: 9.3 (ref 8.7–10.7)
eGFR: 84

## 2022-01-02 LAB — HEPATIC FUNCTION PANEL
ALT: 18 (ref 7–35)
AST: 15 (ref 13–35)
Alkaline Phosphatase: 86 (ref 25–125)
Bilirubin, Direct: 0.1 (ref 0.01–0.4)
Bilirubin, Total: 0.2

## 2022-01-02 LAB — BASIC METABOLIC PANEL
BUN: 16 (ref 4–21)
CO2: 25 — AB (ref 13–22)
Chloride: 104 (ref 99–108)
Creatinine: 0.8 (ref 0.5–1.1)
Glucose: 98
Potassium: 4.5 (ref 3.4–5.3)
Sodium: 144 (ref 137–147)

## 2022-01-02 LAB — HEMOGLOBIN A1C: Hemoglobin A1C: 6.2

## 2022-01-02 LAB — CBC AND DIFFERENTIAL
HCT: 40 (ref 36–46)
Hemoglobin: 13 (ref 12.0–16.0)
Neutrophils Absolute: 2.9
Platelets: 166 (ref 150–399)
WBC: 5.7

## 2022-01-02 LAB — LIPID PANEL
Cholesterol: 175 (ref 0–200)
HDL: 60 (ref 35–70)
LDL Cholesterol: 100
Triglycerides: 80 (ref 40–160)

## 2022-01-02 LAB — CBC: RBC: 4.67 (ref 3.87–5.11)

## 2022-01-06 NOTE — Progress Notes (Signed)
Labs abstracted and filed for appt tomorrow./

## 2022-01-07 ENCOUNTER — Ambulatory Visit (INDEPENDENT_AMBULATORY_CARE_PROVIDER_SITE_OTHER): Payer: Managed Care, Other (non HMO) | Admitting: Internal Medicine

## 2022-01-07 ENCOUNTER — Other Ambulatory Visit: Payer: Self-pay

## 2022-01-07 ENCOUNTER — Other Ambulatory Visit (HOSPITAL_COMMUNITY)
Admission: RE | Admit: 2022-01-07 | Discharge: 2022-01-07 | Disposition: A | Discharge status: Home / Self Care | Payer: Managed Care, Other (non HMO) | Source: Ambulatory Visit | Attending: Internal Medicine | Admitting: Internal Medicine

## 2022-01-07 VITALS — BP 128/78 | HR 85 | Temp 97.6°F | Resp 16 | Ht 69.5 in | Wt 392.0 lb

## 2022-01-07 DIAGNOSIS — Z1231 Encounter for screening mammogram for malignant neoplasm of breast: Secondary | ICD-10-CM

## 2022-01-07 DIAGNOSIS — Z124 Encounter for screening for malignant neoplasm of cervix: Secondary | ICD-10-CM | POA: Diagnosis not present

## 2022-01-07 DIAGNOSIS — Z86010 Personal history of colonic polyps: Secondary | ICD-10-CM

## 2022-01-07 DIAGNOSIS — Z0000 Encounter for general adult medical examination without abnormal findings: Secondary | ICD-10-CM

## 2022-01-07 DIAGNOSIS — E7800 Pure hypercholesterolemia, unspecified: Secondary | ICD-10-CM

## 2022-01-07 DIAGNOSIS — E1165 Type 2 diabetes mellitus with hyperglycemia: Secondary | ICD-10-CM

## 2022-01-07 DIAGNOSIS — R002 Palpitations: Secondary | ICD-10-CM

## 2022-01-07 DIAGNOSIS — D696 Thrombocytopenia, unspecified: Secondary | ICD-10-CM

## 2022-01-07 DIAGNOSIS — I10 Essential (primary) hypertension: Secondary | ICD-10-CM

## 2022-01-07 MED ORDER — ROSUVASTATIN CALCIUM 20 MG PO TABS: 20.0000 mg | ORAL_TABLET | Freq: Every day | ORAL | 1 refills | Status: DC

## 2022-01-07 NOTE — Progress Notes (Signed)
Patient ID: Morgan Duncan, female   DOB: 16-Dec-1960, 61 y.o.   MRN: [id number]   Subjective:    Patient ID: Morgan Duncan, female    DOB: March 06, 1961, 61 y.o.   MRN: [id number]  This visit occurred during the SARS-CoV-2 public health emergency.  Safety protocols were in place, including screening questions prior to the visit, additional usage of staff PPE, and extensive cleaning of exam room while observing appropriate contact time as indicated for disinfecting solutions.   Patient here for her physical exam.   Chief Complaint  Patient presents with   Annual Exam   .   HPI She is currently doing relatively well.  Tolerating ozempic.  States when first takes, may have a little loose stool, but report no significant issues with the medication.  Has lost weight.  Feels better.  No chest pain or sob reported.  Palpitations are better.  Feels may be related to increased stress and anxiety.  Had requested cardiology evaluation previously given family history.  Sister s/p recent ablation, etc.  No acid reflux reported.  No abdominal pain. Discussed labs.  Discussed increasing crestor.    Past Medical History:  Diagnosis Date   Asymptomatic cholelithiasis    Hypertension    Mammographic microcalcification 2015   Thrombocytopenia (HCC)    Recent platelet count 151 on 11/01/08   Tobacco use    Past Surgical History:  Procedure Laterality Date   BREAST BIOPSY Left 2012   NEG   KNEE SURGERY Left 3/11   left knee    Family History  Problem Relation Age of Onset   Hypertension Mother    Diabetes Mother    Hypothyroidism Mother    Heart disease Father        Died of a Heart Attack    Breast cancer Neg Hx    Social History   Socioeconomic History   Marital status: Single    Spouse name: Not on file   Number of children: Not on file   Years of education: Not on file   Highest education level: Not on file  Occupational History   Not on file  Tobacco Use   Smoking status: Former     Packs/day: 0.25    Years: 12.00    Pack years: 3.00    Types: Cigarettes   Smokeless tobacco: Never   Tobacco comments:    Quit March 2016  Substance and Sexual Activity   Alcohol use: Yes    Alcohol/week: 0.0 standard drinks    Comment: occasionally   Drug use: No   Sexual activity: Not on file  Other Topics Concern   Not on file  Social History Narrative   Not on file   Social Determinants of Health   Financial Resource Strain: Low Risk    Difficulty of Paying Living Expenses: Not very hard  Food Insecurity: Not on file  Transportation Needs: Not on file  Physical Activity: Not on file  Stress: Not on file  Social Connections: Not on file     Review of Systems  Constitutional:  Negative for appetite change and unexpected weight change.  HENT:  Negative for congestion, sinus pressure and sore throat.   Eyes:  Negative for pain and visual disturbance.  Respiratory:  Negative for cough, chest tightness and shortness of breath.   Cardiovascular:  Negative for chest pain and leg swelling.  Gastrointestinal:  Negative for abdominal pain, nausea and vomiting.       No significant diarrhea.  Genitourinary:  Negative for difficulty urinating and dysuria.  Musculoskeletal:  Negative for back pain and joint swelling.  Skin:  Negative for color change and rash.  Neurological:  Negative for dizziness, light-headedness and headaches.  Hematological:  Negative for adenopathy. Does not bruise/bleed easily.  Psychiatric/Behavioral:  Negative for agitation and dysphoric mood.       Objective:     BP 128/78    Pulse 85    Temp 97.6 F (36.4 C)    Resp 16    Ht 5' 9.5" (1.765 m)    Wt (!) 392 lb (177.8 kg)    LMP 12/29/2012    SpO2 97%    BMI 57.06 kg/m  Wt Readings from Last 3 Encounters:  01/07/22 (!) 392 lb (177.8 kg)  09/03/21 (!) 401 lb (181.9 kg)  06/06/21 (!) 411 lb (186.4 kg)    Physical Exam Vitals reviewed.  Constitutional:      General: She is not in acute  distress.    Appearance: Normal appearance. She is well-developed.  HENT:     Head: Normocephalic and atraumatic.     Right Ear: External ear normal.     Left Ear: External ear normal.  Eyes:     General: No scleral icterus.       Right eye: No discharge.        Left eye: No discharge.     Conjunctiva/sclera: Conjunctivae normal.  Neck:     Thyroid: No thyromegaly.  Cardiovascular:     Rate and Rhythm: Normal rate and regular rhythm.  Pulmonary:     Effort: No tachypnea, accessory muscle usage or respiratory distress.     Breath sounds: Normal breath sounds. No decreased breath sounds or wheezing.  Chest:  Breasts:    Right: No inverted nipple, mass, nipple discharge or tenderness (no axillary adenopathy).     Left: No inverted nipple, mass, nipple discharge or tenderness (no axilarry adenopathy).  Abdominal:     General: Bowel sounds are normal.     Palpations: Abdomen is soft.     Tenderness: There is no abdominal tenderness.  Genitourinary:    Comments: Normal external genitalia.  Vaginal vault without lesions.  Cervix identified.  Pap smear performed.  Could not appreciate any adnexal masses or tenderness.   Musculoskeletal:        General: No swelling or tenderness.     Cervical back: Neck supple. No tenderness.  Lymphadenopathy:     Cervical: No cervical adenopathy.  Skin:    Findings: No erythema or rash.  Neurological:     Mental Status: She is alert and oriented to person, place, and time.  Psychiatric:        Mood and Affect: Mood normal.        Behavior: Behavior normal.     Outpatient Encounter Medications as of 01/07/2022  Medication Sig   rosuvastatin (CRESTOR) 20 MG tablet Take 1 tablet (20 mg total) by mouth daily.   acetaminophen (TYLENOL) 500 MG tablet Take 500 mg by mouth every 6 (six) hours as needed.   aspirin EC 81 MG tablet Take 81 mg by mouth daily. Swallow whole.   metFORMIN (GLUCOPHAGE-XR) 500 MG 24 hr tablet TAKE 1 TABLET BY MOUTH EVERY DAY  WITH BREAKFAST   potassium chloride (KLOR-CON M10) 10 MEQ tablet Take 1 tablet (10 mEq total) by mouth daily.   Semaglutide, 1 MG/DOSE, (OZEMPIC, 1 MG/DOSE,) 4 MG/3ML SOPN Inject 1 mg into the skin once a week.   triamterene-hydrochlorothiazide (  MAXZIDE-25) 37.5-25 MG tablet TAKE 1 TABLET BY MOUTH EVERY DAY   [DISCONTINUED] potassium chloride (K-DUR) 10 MEQ tablet TAKE 1 TABLET EVERY DAY   [DISCONTINUED] rosuvastatin (CRESTOR) 10 MG tablet Take 1 tablet (10 mg total) by mouth daily.   No facility-administered encounter medications on file as of 01/07/2022.     Lab Results  Component Value Date   WBC 5.7 01/02/2022   HGB 13.0 01/02/2022   HCT 40 01/02/2022   PLT 166 01/02/2022   GLUCOSE 106 (H) 05/28/2021   CHOL 175 01/02/2022   TRIG 80 01/02/2022   HDL 60 01/02/2022   LDLDIRECT 140.7 07/13/2013   LDLCALC 100 01/02/2022   ALT 18 01/02/2022   AST 15 01/02/2022   NA 144 01/02/2022   K 4.5 01/02/2022   CL 104 01/02/2022   CREATININE 0.8 01/02/2022   BUN 16 01/02/2022   CO2 25 (A) 01/02/2022   TSH 0.898 05/28/2021   HGBA1C 6.2 01/02/2022   MICROALBUR 0.9 01/21/2021    MM 3D SCREEN BREAST BILATERAL  Result Date: 02/05/2021 CLINICAL DATA:  Screening. EXAM: DIGITAL SCREENING BILATERAL MAMMOGRAM WITH TOMOSYNTHESIS AND CAD TECHNIQUE: Bilateral screening digital craniocaudal and mediolateral oblique mammograms were obtained. Bilateral screening digital breast tomosynthesis was performed. The images were evaluated with computer-aided detection. COMPARISON:  Previous exam(s). ACR Breast Density Category a: The breast tissue is almost entirely fatty. FINDINGS: There are no findings suspicious for malignancy. The images were evaluated with computer-aided detection. IMPRESSION: No mammographic evidence of malignancy. A result letter of this screening mammogram will be mailed directly to the patient. RECOMMENDATION: Screening mammogram in one year. (Code:SM-B-01Y) BI-RADS CATEGORY  1: Negative.  Electronically Signed   By: Audie Pinto M.D.   On: 02/05/2021 10:15       Assessment & Plan:   Problem List Items Addressed This Visit     Essential hypertension, benign    Continue triam/hctz.  Follow pressures.  No changes made today.  Will follow.  Can consider adding ace inhibitor or ARB in future. Follow metabolic panel.       Relevant Medications   rosuvastatin (CRESTOR) 20 MG tablet   Health care maintenance    Physical today 01/07/22.  PAP 01/07/22.  Colonoscopy overdue.  She had discussed with GI.  Plans to do closer to end of year.  Mammogram 02/05/21 - birads I.  Also wants to do closer to end of year.       History of colonic polyps    Overdue colonoscopy.  Had referred to GI. Plans to do closer to end of year.        Hypercholesteremia    Discussed recent labs and goal LDL.  Will increase crestor to 90m q day. Low cholesterol diet and exercise.  Follow lipid panel and liver function tests.      Relevant Medications   rosuvastatin (CRESTOR) 20 MG tablet   Palpitations    Persistent intermittent episodes. Feel may be aggravated by increased stress.  Had previously requested referral to cardiology.  Sister s/p ablation.  She wanted to have cardiology check her as well.  F/u on previous referral.       Severe obesity (BMI >= 40) (HCC)    On ozempic.  Has lost more weight.  Feels better. Continue low carb diet and exercise.  Follow.       [place]rombocytopenia ([place]    Most recent platelet count in the normal range.  Will follow.       Type 2 diabetes  mellitus with hyperglycemia ([place])    On ozempic (1 mg).   Low carb diet and exercise.  Follow met b and a1c. Has lost more weight.   Lab Results  Component Value Date   HGBA1C 6.2 01/02/2022       Relevant Medications   rosuvastatin (CRESTOR) 20 MG tablet   Other Visit Diagnoses     Routine general medical examination at a health care facility    -  Primary   Visit for screening mammogram       Cervical  cancer screening       Relevant Orders   Cytology - PAP( )        Einar Pheasant, MD

## 2022-01-08 ENCOUNTER — Telehealth: Payer: Self-pay | Admitting: Internal Medicine

## 2022-01-08 ENCOUNTER — Encounter: Payer: Self-pay | Admitting: Internal Medicine

## 2022-01-08 NOTE — Assessment & Plan Note (Signed)
Discussed recent labs and goal LDL.  Will increase crestor to 20mg  q day. Low cholesterol diet and exercise.  Follow lipid panel and liver function tests.

## 2022-01-08 NOTE — Telephone Encounter (Signed)
I had placed an order for cardiology referral (08/2021).  Pt states has never heard about scheduling an appt.  Can you help with this?  Let me know if I need to do anything else.

## 2022-01-08 NOTE — Assessment & Plan Note (Signed)
Continue triam/hctz.  Follow pressures.  No changes made today.  Will follow.  Can consider adding ace inhibitor or ARB in future. Follow metabolic panel.

## 2022-01-08 NOTE — Assessment & Plan Note (Signed)
On ozempic (1 mg).   Low carb diet and exercise.  Follow met b and a1c. Has lost more weight.   Lab Results  Component Value Date   HGBA1C 6.2 01/02/2022

## 2022-01-08 NOTE — Assessment & Plan Note (Signed)
On ozempic.  Has lost more weight.  Feels better. Continue low carb diet and exercise.  Follow.  

## 2022-01-08 NOTE — Assessment & Plan Note (Signed)
Most recent platelet count in the normal range.  Will follow.

## 2022-01-08 NOTE — Assessment & Plan Note (Signed)
Persistent intermittent episodes. Feel may be aggravated by increased stress.  Had previously requested referral to cardiology.  Sister s/p ablation.  She wanted to have cardiology check her as well.  F/u on previous referral.

## 2022-01-08 NOTE — Assessment & Plan Note (Signed)
Overdue colonoscopy.  Had referred to GI. Plans to do closer to end of year.   

## 2022-01-08 NOTE — Assessment & Plan Note (Signed)
Physical today 01/07/22.  PAP 01/07/22.  Colonoscopy overdue.  She had discussed with GI.  Plans to do closer to end of year.  Mammogram 02/05/21 - birads I.  Also wants to do closer to end of year.

## 2022-01-09 ENCOUNTER — Telehealth: Payer: Self-pay | Admitting: Internal Medicine

## 2022-01-09 LAB — CYTOLOGY - PAP
Comment: NEGATIVE
Diagnosis: NEGATIVE
High risk HPV: NEGATIVE

## 2022-01-09 NOTE — Telephone Encounter (Signed)
Called pt about cardio referral not able to leave vm vm was full.

## 2022-01-10 NOTE — Telephone Encounter (Signed)
I had talked with her about cardiology appt.  She is interested in scheduling.  Please contact her and notify her of attached information.  Please provide her with number to call and schedule appt.

## 2022-01-12 NOTE — Telephone Encounter (Signed)
Number provided to [facility]

## 2022-01-20 ENCOUNTER — Ambulatory Visit: Payer: Self-pay | Admitting: Pharmacist

## 2022-01-20 ENCOUNTER — Ambulatory Visit: Payer: Managed Care, Other (non HMO) | Admitting: Internal Medicine

## 2022-01-20 NOTE — Chronic Care Management (AMB) (Signed)
?  Chronic Care Management  ? ?Note ? ?01/20/2022 ?Name: Morgan Duncan MRN: [id number] DOB: June 24, 1961 ? ? ? ?Closing pharmacy CCM case at this time. Will collaborate with Care Guide to outreach to schedule follow up with RN CM. Patient has clinic contact information for future questions or concerns.  ? ?Catie Darnelle Maffucci, PharmD, [place], CPP ?Clinical Pharmacist ?Therapist, music at [employer] ?[phone] ? ?

## 2022-02-05 ENCOUNTER — Ambulatory Visit
Admission: RE | Admit: 2022-02-05 | Discharge: 2022-02-05 | Disposition: A | Discharge status: Home / Self Care | Payer: Managed Care, Other (non HMO) | Source: Ambulatory Visit | Attending: Internal Medicine | Admitting: Internal Medicine

## 2022-02-05 ENCOUNTER — Other Ambulatory Visit: Payer: Self-pay

## 2022-02-05 DIAGNOSIS — Z1231 Encounter for screening mammogram for malignant neoplasm of breast: Secondary | ICD-10-CM | POA: Insufficient documentation

## 2022-02-17 ENCOUNTER — Telehealth: Payer: Managed Care, Other (non HMO)

## 2022-03-13 ENCOUNTER — Telehealth: Payer: Self-pay

## 2022-03-13 ENCOUNTER — Telehealth: Payer: Self-pay | Admitting: Internal Medicine

## 2022-03-13 DIAGNOSIS — E1165 Type 2 diabetes mellitus with hyperglycemia: Secondary | ICD-10-CM

## 2022-03-13 MED ORDER — OZEMPIC (1 MG/DOSE) 4 MG/3ML ~~LOC~~ SOPN: 1.0000 mg | PEN_INJECTOR | SUBCUTANEOUS | 1 refills | Status: DC

## 2022-03-13 NOTE — Telephone Encounter (Signed)
Patient called and needs a Prior Auth on her  ?Semaglutide, 1 MG/DOSE, (OZEMPIC, 1 MG/DOSE,) 4 MG/3ML SOPN. Patient is out of medication. ?

## 2022-03-13 NOTE — Telephone Encounter (Signed)
Morgan Duncan (Key: [id number]) ?Ozempic (0.25 or 0.5 MG/DOSE) 2MG /1.5ML pen-injectors ?  ?Form ?[employer] Electronic PA Form [phone] NCPDP) ? ?Message from Plan ?Drug is covered by current benefit plan. No further PA activity needed. ?

## 2022-03-13 NOTE — Telephone Encounter (Signed)
Attempted PA - no PA necessary, covered by insurance as per cover my meds. ?Pt advised. ?Rx refill sent in to pharm. ?

## 2022-05-04 ENCOUNTER — Telehealth: Payer: Self-pay | Admitting: Internal Medicine

## 2022-05-04 NOTE — Telephone Encounter (Signed)
Faxed to [facility]

## 2022-05-04 NOTE — Telephone Encounter (Signed)
Pt called in requesting lab orders to be sent to [facility] clinic... pt stated that she have an appt at 10am... No current lab orders in system... Future lab in system from last year... Pt requesting callback

## 2022-05-07 ENCOUNTER — Other Ambulatory Visit: Payer: Self-pay | Admitting: Internal Medicine

## 2022-05-07 ENCOUNTER — Ambulatory Visit (INDEPENDENT_AMBULATORY_CARE_PROVIDER_SITE_OTHER): Payer: Managed Care, Other (non HMO) | Admitting: Internal Medicine

## 2022-05-07 ENCOUNTER — Encounter: Payer: Self-pay | Admitting: Internal Medicine

## 2022-05-07 DIAGNOSIS — E7800 Pure hypercholesterolemia, unspecified: Secondary | ICD-10-CM

## 2022-05-07 DIAGNOSIS — E1165 Type 2 diabetes mellitus with hyperglycemia: Secondary | ICD-10-CM | POA: Diagnosis not present

## 2022-05-07 DIAGNOSIS — Z860100 Personal history of colon polyps, unspecified: Secondary | ICD-10-CM

## 2022-05-07 DIAGNOSIS — I10 Essential (primary) hypertension: Secondary | ICD-10-CM

## 2022-05-07 DIAGNOSIS — D696 Thrombocytopenia, unspecified: Secondary | ICD-10-CM | POA: Diagnosis not present

## 2022-05-07 LAB — HM DIABETES FOOT EXAM

## 2022-05-07 MED ORDER — ROSUVASTATIN CALCIUM 40 MG PO TABS: 40.0000 mg | ORAL_TABLET | Freq: Every day | ORAL | 3 refills | Status: DC

## 2022-05-07 NOTE — Assessment & Plan Note (Signed)
Continue triam/hctz.  Follow pressures.  No changes made today.  Will follow.  Met b just checked - wnl - GFR 79. Follow metabolic panel.

## 2022-05-07 NOTE — Assessment & Plan Note (Signed)
On ozempic (1 mg).   Low carb diet and exercise.  Follow met b and a1c. Has lost more weight.  Recent a1c 6.1.

## 2022-05-07 NOTE — Assessment & Plan Note (Signed)
Overdue colonoscopy.  Had referred to GI. Plans to do closer to end of year.   

## 2022-05-07 NOTE — Progress Notes (Signed)
Patient ID: Morgan Duncan, female   DOB: 05/03/1961, 61 y.o.   MRN: [id number]   Subjective:    Patient ID: Morgan Duncan, female    DOB: Aug 21, 1961, 61 y.o.   MRN: [id number]   Patient here for a scheduled follow up.   Chief Complaint  Patient presents with   Hypertension   Diabetes   .   HPI Has been on ozempic.  Has lost weight.  Discussed diet and exercise.  Tolerating.  No chest pain or sob reported.  No cough or congestion.  No abdominal pain or bowel change reported.  Discussed recent labs.  A1c 6.1.     Past Medical History:  Diagnosis Date   Asymptomatic cholelithiasis    Hypertension    Mammographic microcalcification 2015   Thrombocytopenia (HCC)    Recent platelet count 151 on 11/01/08   Tobacco use    Past Surgical History:  Procedure Laterality Date   BREAST BIOPSY Left 2012   NEG   KNEE SURGERY Left 3/11   left knee    Family History  Problem Relation Age of Onset   Hypertension Mother    Diabetes Mother    Hypothyroidism Mother    Heart disease Father        Died of a Heart Attack    Breast cancer Neg Hx    Social History   Socioeconomic History   Marital status: Single    Spouse name: Not on file   Number of children: Not on file   Years of education: Not on file   Highest education level: Not on file  Occupational History   Not on file  Tobacco Use   Smoking status: Former    Packs/day: 0.25    Years: 12.00    Total pack years: 3.00    Types: Cigarettes   Smokeless tobacco: Never   Tobacco comments:    Quit March 2016  Substance and Sexual Activity   Alcohol use: Yes    Alcohol/week: 0.0 standard drinks of alcohol    Comment: occasionally   Drug use: No   Sexual activity: Not on file  Other Topics Concern   Not on file  Social History Narrative   Not on file   Social Determinants of Health   Financial Resource Strain: Low Risk  (03/24/2021)   Overall Financial Resource Strain (CARDIA)    Difficulty of Paying Living  Expenses: Not very hard  Food Insecurity: Not on file  Transportation Needs: Not on file  Physical Activity: Not on file  Stress: Not on file  Social Connections: Not on file     Review of Systems  Constitutional:  Negative for appetite change and unexpected weight change.  HENT:  Negative for sinus pressure.   Respiratory:  Negative for cough, chest tightness and shortness of breath.   Cardiovascular:  Negative for chest pain, palpitations and leg swelling.  Gastrointestinal:  Negative for abdominal pain, diarrhea, nausea and vomiting.  Genitourinary:  Negative for difficulty urinating and dysuria.  Musculoskeletal:  Negative for joint swelling and myalgias.  Skin:  Negative for color change and rash.  Neurological:  Negative for dizziness, light-headedness and headaches.  Psychiatric/Behavioral:  Negative for agitation and dysphoric mood.        Objective:     BP 136/70 (BP Location: Right Arm, Patient Position: Sitting, Cuff Size: Large)   Pulse 85   Temp 98.1 F (36.7 C) (Temporal)   Resp 17   Ht 5' 9.5" (1.765 m)  Wt (!) 383 lb (173.7 kg)   LMP 11/21/2012   SpO2 100%   BMI 55.75 kg/m  Wt Readings from Last 3 Encounters:  05/07/22 (!) 383 lb (173.7 kg)  01/07/22 (!) 392 lb (177.8 kg)  09/03/21 (!) 401 lb (181.9 kg)    Physical Exam Vitals reviewed.  Constitutional:      General: She is not in acute distress.    Appearance: Normal appearance.  HENT:     Head: Normocephalic and atraumatic.     Right Ear: External ear normal.     Left Ear: External ear normal.  Eyes:     General: No scleral icterus.       Right eye: No discharge.        Left eye: No discharge.     Conjunctiva/sclera: Conjunctivae normal.  Neck:     Thyroid: No thyromegaly.  Cardiovascular:     Rate and Rhythm: Normal rate and regular rhythm.  Pulmonary:     Effort: No respiratory distress.     Breath sounds: Normal breath sounds. No wheezing.  Abdominal:     General: Bowel sounds are  normal.     Palpations: Abdomen is soft.     Tenderness: There is no abdominal tenderness.  Musculoskeletal:        General: No swelling or tenderness.     Cervical back: Neck supple. No tenderness.  Lymphadenopathy:     Cervical: No cervical adenopathy.  Skin:    Findings: No erythema or rash.  Neurological:     Mental Status: She is alert.  Psychiatric:        Mood and Affect: Mood normal.        Behavior: Behavior normal.      Outpatient Encounter Medications as of 05/07/2022  Medication Sig   acetaminophen (TYLENOL) 500 MG tablet Take 500 mg by mouth every 6 (six) hours as needed.   aspirin EC 81 MG tablet Take 81 mg by mouth daily. Swallow whole.   metFORMIN (GLUCOPHAGE-XR) 500 MG 24 hr tablet TAKE 1 TABLET BY MOUTH EVERY DAY WITH BREAKFAST   potassium chloride (KLOR-CON M10) 10 MEQ tablet Take 1 tablet (10 mEq total) by mouth daily.   rosuvastatin (CRESTOR) 40 MG tablet Take 1 tablet (40 mg total) by mouth daily.   Semaglutide, 1 MG/DOSE, (OZEMPIC, 1 MG/DOSE,) 4 MG/3ML SOPN Inject 1 mg into the skin once a week.   [DISCONTINUED] potassium chloride (K-DUR) 10 MEQ tablet TAKE 1 TABLET EVERY DAY   [DISCONTINUED] rosuvastatin (CRESTOR) 20 MG tablet Take 1 tablet (20 mg total) by mouth daily.   [DISCONTINUED] triamterene-hydrochlorothiazide (MAXZIDE-25) 37.5-25 MG tablet TAKE 1 TABLET BY MOUTH EVERY DAY   No facility-administered encounter medications on file as of 05/07/2022.     Lab Results  Component Value Date   WBC 5.7 01/02/2022   HGB 13.0 01/02/2022   HCT 40 01/02/2022   PLT 166 01/02/2022   GLUCOSE 106 (H) 05/28/2021   CHOL 175 01/02/2022   TRIG 80 01/02/2022   HDL 60 01/02/2022   LDLDIRECT 140.7 07/13/2013   LDLCALC 100 01/02/2022   ALT 18 01/02/2022   AST 15 01/02/2022   NA 144 01/02/2022   K 4.5 01/02/2022   CL 104 01/02/2022   CREATININE 0.8 01/02/2022   BUN 16 01/02/2022   CO2 25 (A) 01/02/2022   TSH 0.898 05/28/2021   HGBA1C 6.2 01/02/2022    MICROALBUR 0.9 01/21/2021    MM 3D SCREEN BREAST BILATERAL  Result Date: 02/05/2022 CLINICAL  DATA:  Screening. EXAM: DIGITAL SCREENING BILATERAL MAMMOGRAM WITH TOMOSYNTHESIS AND CAD TECHNIQUE: Bilateral screening digital craniocaudal and mediolateral oblique mammograms were obtained. Bilateral screening digital breast tomosynthesis was performed. The images were evaluated with computer-aided detection. COMPARISON:  Previous exam(s). ACR Breast Density Category b: There are scattered areas of fibroglandular density. FINDINGS: There are no findings suspicious for malignancy. IMPRESSION: No mammographic evidence of malignancy. A result letter of this screening mammogram will be mailed directly to the patient. RECOMMENDATION: Screening mammogram in one year. (Code:SM-B-01Y) BI-RADS CATEGORY  1: Negative. Electronically Signed   By: Abelardo Diesel M.D.   On: 02/05/2022 11:16       Assessment & Plan:   Problem List Items Addressed This Visit     Essential hypertension, benign    Continue triam/hctz.  Follow pressures.  No changes made today.  Will follow.  Met b just checked - wnl - GFR 79. Follow metabolic panel.       Relevant Medications   rosuvastatin (CRESTOR) 40 MG tablet   Other Relevant Orders   Basic Metabolic Panel (BMET)   History of colonic polyps    Overdue colonoscopy.  Had referred to GI. Plans to do closer to end of year.        Hypercholesteremia    crestor 84m now.  Low cholesterol diet and exercise.  LDL - 91 (just checked).        Relevant Medications   rosuvastatin (CRESTOR) 40 MG tablet   Other Relevant Orders   Lipid Profile   Hepatic function panel   Thrombocytopenia (HCC)    Platelet count just checked 155.  Follow.       Type 2 diabetes mellitus with hyperglycemia (HCC)    On ozempic (1 mg).   Low carb diet and exercise.  Follow met b and a1c. Has lost more weight.  Recent a1c 6.1.       Relevant Medications   rosuvastatin (CRESTOR) 40 MG tablet   Other  Relevant Orders   Urine Microalbumin w/creat. ratio   HgB A1c   TSH     CEinar Pheasant MD

## 2022-05-07 NOTE — Assessment & Plan Note (Signed)
crestor 20mg  now.  Low cholesterol diet and exercise.  LDL - 91 (just checked).

## 2022-05-07 NOTE — Assessment & Plan Note (Signed)
Platelet count just checked 155.  Follow.

## 2022-05-17 ENCOUNTER — Encounter: Payer: Self-pay | Admitting: Internal Medicine

## 2022-08-06 ENCOUNTER — Ambulatory Visit (INDEPENDENT_AMBULATORY_CARE_PROVIDER_SITE_OTHER): Payer: Managed Care, Other (non HMO) | Admitting: Internal Medicine

## 2022-08-06 DIAGNOSIS — E1165 Type 2 diabetes mellitus with hyperglycemia: Secondary | ICD-10-CM

## 2022-08-07 ENCOUNTER — Other Ambulatory Visit: Payer: Self-pay | Admitting: Internal Medicine

## 2022-08-11 ENCOUNTER — Ambulatory Visit (INDEPENDENT_AMBULATORY_CARE_PROVIDER_SITE_OTHER): Payer: Managed Care, Other (non HMO) | Admitting: Internal Medicine

## 2022-08-11 ENCOUNTER — Encounter: Payer: Self-pay | Admitting: Internal Medicine

## 2022-08-11 VITALS — BP 120/80 | HR 77 | Temp 97.8°F | Ht 70.0 in | Wt 377.8 lb

## 2022-08-11 DIAGNOSIS — Z1211 Encounter for screening for malignant neoplasm of colon: Secondary | ICD-10-CM

## 2022-08-11 DIAGNOSIS — E1165 Type 2 diabetes mellitus with hyperglycemia: Secondary | ICD-10-CM

## 2022-08-11 DIAGNOSIS — M25511 Pain in right shoulder: Secondary | ICD-10-CM

## 2022-08-11 DIAGNOSIS — I10 Essential (primary) hypertension: Secondary | ICD-10-CM | POA: Diagnosis not present

## 2022-08-11 DIAGNOSIS — Z86010 Personal history of colonic polyps: Secondary | ICD-10-CM

## 2022-08-11 DIAGNOSIS — E7800 Pure hypercholesterolemia, unspecified: Secondary | ICD-10-CM | POA: Diagnosis not present

## 2022-08-11 DIAGNOSIS — D696 Thrombocytopenia, unspecified: Secondary | ICD-10-CM | POA: Diagnosis not present

## 2022-08-11 MED ORDER — SEMAGLUTIDE (2 MG/DOSE) 8 MG/3ML ~~LOC~~ SOPN: 2.0000 mg | PEN_INJECTOR | SUBCUTANEOUS | 3 refills | Status: DC

## 2022-08-11 NOTE — Progress Notes (Signed)
Patient ID: Morgan Duncan, female   DOB: [birth year], 61 y.o.   MRN: [id number]   Subjective:    Patient ID: Morgan Duncan, female    DOB: February 13, 1961, 61 y.o.   MRN: [id number]   Patient here for  Chief Complaint  Patient presents with   Follow-up    3 month follow up   .   HPI Here to follow up regarding her blood pressure and weight loss.  On ozempic.  Doing well with the medication.  No chest pain or sob reported.  No abdominal pain.  Bowels moving.  Having some right shoulder pain.  Hurts to lie down on her shoulder.  Good rom.  Discussed colonoscopy.     Past Medical History:  Diagnosis Date   Asymptomatic cholelithiasis    Hypertension    Mammographic microcalcification 2015   Thrombocytopenia (HCC)    Recent platelet count 151 on 11/01/08   Tobacco use    Past Surgical History:  Procedure Laterality Date   BREAST BIOPSY Left 2012   NEG   KNEE SURGERY Left 3/11   left knee    Family History  Problem Relation Age of Onset   Hypertension Mother    Diabetes Mother    Hypothyroidism Mother    Heart disease Father        Died of a Heart Attack    Breast cancer Neg Hx    Social History   Socioeconomic History   Marital status: Single    Spouse name: Not on file   Number of children: Not on file   Years of education: Not on file   Highest education level: Not on file  Occupational History   Not on file  Tobacco Use   Smoking status: Former    Packs/day: 0.25    Years: 12.00    Total pack years: 3.00    Types: Cigarettes   Smokeless tobacco: Never   Tobacco comments:    Quit March 2016  Substance and Sexual Activity   Alcohol use: Yes    Alcohol/week: 0.0 standard drinks of alcohol    Comment: occasionally   Drug use: No   Sexual activity: Not on file  Other Topics Concern   Not on file  Social History Narrative   Not on file   Social Determinants of Health   Financial Resource Strain: Low Risk  (03/24/2021)   Overall Financial Resource Strain  (CARDIA)    Difficulty of Paying Living Expenses: Not very hard  Food Insecurity: Not on file  Transportation Needs: Not on file  Physical Activity: Not on file  Stress: Not on file  Social Connections: Not on file     Review of Systems  Constitutional:  Negative for appetite change and unexpected weight change.  HENT:  Negative for congestion and sinus pressure.   Respiratory:  Negative for cough, chest tightness and shortness of breath.   Cardiovascular:  Negative for chest pain, palpitations and leg swelling.  Gastrointestinal:  Negative for abdominal pain, diarrhea, nausea and vomiting.  Genitourinary:  Negative for difficulty urinating and dysuria.  Musculoskeletal:  Negative for joint swelling and myalgias.       Right shoulder pain as outlined.   Skin:  Negative for color change and rash.  Neurological:  Negative for dizziness, light-headedness and headaches.  Psychiatric/Behavioral:  Negative for agitation and dysphoric mood.        Objective:     BP 120/80 (BP Location: Left Arm, Patient Position: Sitting, Cuff Size:  Large)   Pulse 77   Temp 97.8 F (36.6 C) (Oral)   Ht 5' 10"  (1.778 m)   Wt (!) 377 lb 12.8 oz (171.4 kg)   LMP 11/21/2012   SpO2 96%   BMI 54.21 kg/m  Wt Readings from Last 3 Encounters:  08/11/22 (!) 377 lb 12.8 oz (171.4 kg)  05/07/22 (!) 383 lb (173.7 kg)  01/07/22 (!) 392 lb (177.8 kg)    Physical Exam Vitals reviewed.  Constitutional:      General: She is not in acute distress.    Appearance: Normal appearance.  HENT:     Head: Normocephalic and atraumatic.     Right Ear: External ear normal.     Left Ear: External ear normal.  Eyes:     General: No scleral icterus.       Right eye: No discharge.        Left eye: No discharge.     Conjunctiva/sclera: Conjunctivae normal.  Neck:     Thyroid: No thyromegaly.  Cardiovascular:     Rate and Rhythm: Normal rate and regular rhythm.  Pulmonary:     Effort: No respiratory distress.      Breath sounds: Normal breath sounds. No wheezing.  Abdominal:     General: Bowel sounds are normal.     Palpations: Abdomen is soft.     Tenderness: There is no abdominal tenderness.  Musculoskeletal:        General: No swelling or tenderness.     Cervical back: Neck supple. No tenderness.     Comments: Good rom - shoulder.  No neck pain with rotation of head.  Lymphadenopathy:     Cervical: No cervical adenopathy.  Skin:    Findings: No erythema or rash.  Neurological:     Mental Status: She is alert.  Psychiatric:        Mood and Affect: Mood normal.        Behavior: Behavior normal.      Outpatient Encounter Medications as of 08/11/2022  Medication Sig   acetaminophen (TYLENOL) 500 MG tablet Take 500 mg by mouth every 6 (six) hours as needed.   aspirin EC 81 MG tablet Take 81 mg by mouth daily. Swallow whole.   metFORMIN (GLUCOPHAGE-XR) 500 MG 24 hr tablet TAKE 1 TABLET BY MOUTH EVERY DAY WITH BREAKFAST   potassium chloride (KLOR-CON M10) 10 MEQ tablet Take 1 tablet (10 mEq total) by mouth daily.   rosuvastatin (CRESTOR) 40 MG tablet Take 1 tablet (40 mg total) by mouth daily.   Semaglutide, 2 MG/DOSE, 8 MG/3ML SOPN Inject 2 mg as directed once a week.   triamterene-hydrochlorothiazide (MAXZIDE-25) 37.5-25 MG tablet TAKE 1 TABLET BY MOUTH EVERY DAY   [DISCONTINUED] Semaglutide, 1 MG/DOSE, (OZEMPIC, 1 MG/DOSE,) 4 MG/3ML SOPN Inject 1 mg into the skin once a week.   [DISCONTINUED] potassium chloride (K-DUR) 10 MEQ tablet TAKE 1 TABLET EVERY DAY   No facility-administered encounter medications on file as of 08/11/2022.     Lab Results  Component Value Date   WBC 5.7 01/02/2022   HGB 13.0 01/02/2022   HCT 40 01/02/2022   PLT 166 01/02/2022   GLUCOSE 106 (H) 05/28/2021   CHOL 175 01/02/2022   TRIG 80 01/02/2022   HDL 60 01/02/2022   LDLDIRECT 140.7 07/13/2013   LDLCALC 100 01/02/2022   ALT 18 01/02/2022   AST 15 01/02/2022   NA 144 01/02/2022   K 4.5 01/02/2022   CL  104 01/02/2022  CREATININE 0.8 01/02/2022   BUN 16 01/02/2022   CO2 25 (A) 01/02/2022   TSH 0.898 05/28/2021   HGBA1C 6.2 01/02/2022   MICROALBUR 0.9 01/21/2021    MM 3D SCREEN BREAST BILATERAL  Result Date: 02/05/2022 CLINICAL DATA:  Screening. EXAM: DIGITAL SCREENING BILATERAL MAMMOGRAM WITH TOMOSYNTHESIS AND CAD TECHNIQUE: Bilateral screening digital craniocaudal and mediolateral oblique mammograms were obtained. Bilateral screening digital breast tomosynthesis was performed. The images were evaluated with computer-aided detection. COMPARISON:  Previous exam(s). ACR Breast Density Category b: There are scattered areas of fibroglandular density. FINDINGS: There are no findings suspicious for malignancy. IMPRESSION: No mammographic evidence of malignancy. A result letter of this screening mammogram will be mailed directly to the patient. RECOMMENDATION: Screening mammogram in one year. (Code:SM-B-01Y) BI-RADS CATEGORY  1: Negative. Electronically Signed   By: Abelardo Diesel M.D.   On: 02/05/2022 11:16       Assessment & Plan:   Problem List Items Addressed This Visit     Essential hypertension, benign - Primary    Blood pressure doing well.  Continues on triam/hctz.  Follow pressures.  Follow metabolic panel.       Relevant Orders   Basic metabolic panel   History of colonic polyps    Overdue colonoscopy.  Had referred to GI. Plans to do closer to end of year.        Hypercholesteremia    crestor 65m now.  Low cholesterol diet and exercise.  Lab Results  Component Value Date   CHOL 175 01/02/2022   HDL 60 01/02/2022   LDLCALC 100 01/02/2022   LDLDIRECT 140.7 07/13/2013   TRIG 80 01/02/2022   CHOLHDL 2.7 05/28/2021       Relevant Orders   Hepatic function panel   Lipid panel   Right shoulder pain    Hurts when lying on her shoulder.  Discussed bursitis/arthritis.  Stretches.  Exercise.  Will notify me if desires any further intervention or if persistent pain.        Thrombocytopenia ([facility]    Recheck cbc next labs to confirm stable.       Relevant Orders   CBC with Differential/Platelet   Type 2 diabetes mellitus with hyperglycemia ([facility]    On ozempic (1 mg).   Low carb diet and exercise.  Follow met b and a1c. Has lost more weight.  Recent a1c 6.1.  Tolerating the medication.  Wants to increase to 237m  Follow.       Relevant Medications   Semaglutide, 2 MG/DOSE, 8 MG/3ML SOPN   Other Relevant Orders   TSH   HgB A1c   Urine Microalbumin w/creat. ratio   Other Visit Diagnoses     Colon cancer screening       Relevant Orders   Ambulatory referral to Gastroenterology        ChEinar PheasantMD

## 2022-08-16 ENCOUNTER — Encounter: Payer: Self-pay | Admitting: Internal Medicine

## 2022-08-16 DIAGNOSIS — M25511 Pain in right shoulder: Secondary | ICD-10-CM | POA: Insufficient documentation

## 2022-08-16 NOTE — Assessment & Plan Note (Addendum)
Blood pressure doing well.  Continues on triam/hctz.  Follow pressures.  Follow metabolic panel.  

## 2022-08-16 NOTE — Assessment & Plan Note (Signed)
On ozempic (1 mg).   Low carb diet and exercise.  Follow met b and a1c. Has lost more weight.  Recent a1c 6.1.  Tolerating the medication.  Wants to increase to 68m.  Follow.

## 2022-08-16 NOTE — Assessment & Plan Note (Signed)
Recheck cbc next labs to confirm stable.  

## 2022-08-16 NOTE — Assessment & Plan Note (Signed)
Hurts when lying on her shoulder.  Discussed bursitis/arthritis.  Stretches.  Exercise.  Will notify me if desires any further intervention or if persistent pain.

## 2022-08-16 NOTE — Progress Notes (Signed)
Not seen.  Scheduled appt later in week.

## 2022-08-16 NOTE — Assessment & Plan Note (Signed)
Overdue colonoscopy.  Had referred to GI. Plans to do closer to end of year.

## 2022-08-16 NOTE — Assessment & Plan Note (Addendum)
crestor 20mg  now.  Low cholesterol diet and exercise.  Lab Results  Component Value Date   CHOL 175 01/02/2022   HDL 60 01/02/2022   LDLCALC 100 01/02/2022   LDLDIRECT 140.7 07/13/2013   TRIG 80 01/02/2022   CHOLHDL 2.7 05/28/2021

## 2022-08-27 ENCOUNTER — Other Ambulatory Visit: Payer: Self-pay | Admitting: Internal Medicine

## 2022-08-27 DIAGNOSIS — E1165 Type 2 diabetes mellitus with hyperglycemia: Secondary | ICD-10-CM

## 2022-08-28 ENCOUNTER — Other Ambulatory Visit: Payer: Self-pay | Admitting: Internal Medicine

## 2022-08-28 DIAGNOSIS — I10 Essential (primary) hypertension: Secondary | ICD-10-CM

## 2022-10-23 ENCOUNTER — Telehealth: Payer: Self-pay

## 2022-10-23 ENCOUNTER — Other Ambulatory Visit: Payer: Self-pay

## 2022-10-23 ENCOUNTER — Telehealth: Payer: Self-pay | Admitting: Internal Medicine

## 2022-10-23 MED ORDER — SEMAGLUTIDE (2 MG/DOSE) 8 MG/3ML ~~LOC~~ SOPN
2.0000 mg | PEN_INJECTOR | SUBCUTANEOUS | 3 refills | Status: DC
  Filled 2022-10-23: qty 3, 28d supply, fill #0

## 2022-10-23 NOTE — Telephone Encounter (Signed)
Pt called stating her ozempic is out of stock at her pharmacy and she did not know what the provider wanted her to do. Pt would like to be called

## 2022-10-23 NOTE — Telephone Encounter (Signed)
Rx sent to [facility] pharmacy - pt aware

## 2022-11-26 ENCOUNTER — Other Ambulatory Visit: Payer: Self-pay

## 2022-11-26 MED ORDER — SEMAGLUTIDE (2 MG/DOSE) 8 MG/3ML ~~LOC~~ SOPN: 2.0000 mg | PEN_INJECTOR | SUBCUTANEOUS | 3 refills | Status: DC

## 2022-11-26 NOTE — Telephone Encounter (Signed)
Rx sent  LM for pt to cb to sched labs

## 2022-11-26 NOTE — Telephone Encounter (Signed)
Pt need a refill on ozempic sent to [facility] and pt want lab work done before her 1/26 appointment

## 2022-12-02 NOTE — Telephone Encounter (Signed)
L/M FOR PT. TO C/B.

## 2022-12-03 NOTE — Telephone Encounter (Signed)
S/w pt - orders faxed to [facility] clinic

## 2022-12-08 ENCOUNTER — Other Ambulatory Visit: Payer: Self-pay | Admitting: Internal Medicine

## 2022-12-09 LAB — LIPID PANEL
Chol/HDL Ratio: 2.6 ratio (ref 0.0–4.4)
Cholesterol, Total: 177 mg/dL (ref 100–199)
HDL: 69 mg/dL (ref >39)
LDL Chol Calc (NIH): 91 mg/dL (ref 0–99)
Triglycerides: 93 mg/dL (ref 0–149)
VLDL Cholesterol Cal: 17 mg/dL (ref 5–40)

## 2022-12-09 LAB — CBC WITH DIFFERENTIAL/PLATELET
Basophils Absolute: 0 10*3/uL (ref 0.0–0.2)
Basos: 1 %
EOS (ABSOLUTE): 0.2 10*3/uL (ref 0.0–0.4)
Eos: 5 %
Hematocrit: 39.7 % (ref 34.0–46.6)
Hemoglobin: 12.9 g/dL (ref 11.1–15.9)
Immature Grans (Abs): 0 10*3/uL (ref 0.0–0.1)
Immature Granulocytes: 0 %
Lymphocytes Absolute: 2.1 10*3/uL (ref 0.7–3.1)
Lymphs: 41 %
MCH: 27.3 pg (ref 26.6–33.0)
MCHC: 32.5 g/dL (ref 31.5–35.7)
MCV: 84 fL (ref 79–97)
Monocytes Absolute: 0.3 10*3/uL (ref 0.1–0.9)
Monocytes: 7 %
Neutrophils Absolute: 2.5 10*3/uL (ref 1.4–7.0)
Neutrophils: 46 %
Platelets: 148 10*3/uL — ABNORMAL LOW (ref 150–450)
RBC: 4.72 x10E6/uL (ref 3.77–5.28)
RDW: 14.4 % (ref 11.7–15.4)
WBC: 5.2 10*3/uL (ref 3.4–10.8)

## 2022-12-09 LAB — BASIC METABOLIC PANEL
BUN/Creatinine Ratio: 14 (ref 12–28)
BUN: 15 mg/dL (ref 8–27)
CO2: 21 mmol/L (ref 20–29)
Calcium: 9.6 mg/dL (ref 8.7–10.3)
Chloride: 102 mmol/L (ref 96–106)
Creatinine, Ser: 1.05 mg/dL — ABNORMAL HIGH (ref 0.57–1.00)
Glucose: 102 mg/dL — ABNORMAL HIGH (ref 70–99)
Potassium: 4.9 mmol/L (ref 3.5–5.2)
Sodium: 141 mmol/L (ref 134–144)
eGFR: 61 mL/min/{1.73_m2} (ref >59)

## 2022-12-09 LAB — TSH: TSH: 1.2 u[IU]/mL (ref 0.450–4.500)

## 2022-12-09 LAB — HEPATIC FUNCTION PANEL
ALT: 16 IU/L (ref 0–32)
AST: 16 IU/L (ref 0–40)
Albumin: 4 g/dL (ref 3.8–4.9)
Alkaline Phosphatase: 82 IU/L (ref 44–121)
Bilirubin Total: 0.2 mg/dL (ref 0.0–1.2)
Bilirubin, Direct: 0.1 mg/dL (ref 0.00–0.40)
Total Protein: 7 g/dL (ref 6.0–8.5)

## 2022-12-09 LAB — HEMOGLOBIN A1C
Est. average glucose Bld gHb Est-mCnc: 126 mg/dL
Hgb A1c MFr Bld: 6 % — ABNORMAL HIGH (ref 4.8–5.6)

## 2022-12-09 LAB — MICROALBUMIN / CREATININE URINE RATIO

## 2022-12-09 LAB — SPECIMEN STATUS REPORT

## 2022-12-11 ENCOUNTER — Encounter: Payer: Self-pay | Admitting: Internal Medicine

## 2022-12-11 ENCOUNTER — Ambulatory Visit (INDEPENDENT_AMBULATORY_CARE_PROVIDER_SITE_OTHER): Payer: Managed Care, Other (non HMO) | Admitting: Internal Medicine

## 2022-12-11 VITALS — BP 128/78 | HR 71 | Temp 97.9°F | Resp 16 | Ht 70.0 in | Wt 372.0 lb

## 2022-12-11 DIAGNOSIS — M545 Low back pain, unspecified: Secondary | ICD-10-CM

## 2022-12-11 DIAGNOSIS — I10 Essential (primary) hypertension: Secondary | ICD-10-CM | POA: Diagnosis not present

## 2022-12-11 DIAGNOSIS — D696 Thrombocytopenia, unspecified: Secondary | ICD-10-CM

## 2022-12-11 DIAGNOSIS — E1165 Type 2 diabetes mellitus with hyperglycemia: Secondary | ICD-10-CM | POA: Diagnosis not present

## 2022-12-11 DIAGNOSIS — Z1231 Encounter for screening mammogram for malignant neoplasm of breast: Secondary | ICD-10-CM | POA: Diagnosis not present

## 2022-12-11 DIAGNOSIS — Z86010 Personal history of colonic polyps: Secondary | ICD-10-CM

## 2022-12-11 DIAGNOSIS — E7800 Pure hypercholesterolemia, unspecified: Secondary | ICD-10-CM

## 2022-12-11 NOTE — Progress Notes (Unsigned)
Patient ID: Morgan Duncan, female   DOB: December 10, 1960, 62 y.o.   MRN: [id number]   Subjective:    Patient ID: Morgan Duncan, female    DOB: 1961/06/10, 62 y.o.   MRN: [id number]   Patient here for  Chief Complaint  Patient presents with   Medical Management of Chronic Issues   .   HPI Here to follow up regarding her blood pressure, blood sugar and weight loss.  On ozempic.  Doing well with the medication.  No chest pain or sob reported.  No abdominal pain.  No swallowing problems. Bowels moving.  Scheduled for colonoscopy - 12/2022.  No cough or congestion.  Handling stress.  Overall feels good.  Some low back pain today.  Has been helping take care of her sister-n-law - who is sick.  She has been doing a lot of pulling, etc and feels has just aggravated.  No radicular symptoms.  Discussed taking tylenol.     Past Medical History:  Diagnosis Date   Asymptomatic cholelithiasis    Hypertension    Mammographic microcalcification 2015   Thrombocytopenia (HCC)    Recent platelet count 151 on 11/01/08   Tobacco use    Past Surgical History:  Procedure Laterality Date   BREAST BIOPSY Left 2012   NEG   KNEE SURGERY Left 3/11   left knee    Family History  Problem Relation Age of Onset   Hypertension Mother    Diabetes Mother    Hypothyroidism Mother    Heart disease Father        Died of a Heart Attack    Breast cancer Neg Hx    Social History   Socioeconomic History   Marital status: Single    Spouse name: Not on file   Number of children: Not on file   Years of education: Not on file   Highest education level: Not on file  Occupational History   Not on file  Tobacco Use   Smoking status: Former    Packs/day: 0.25    Years: 12.00    Total pack years: 3.00    Types: Cigarettes   Smokeless tobacco: Never   Tobacco comments:    Quit March 2016  Substance and Sexual Activity   Alcohol use: Yes    Alcohol/week: 0.0 standard drinks of alcohol    Comment: occasionally    Drug use: No   Sexual activity: Not on file  Other Topics Concern   Not on file  Social History Narrative   Not on file   Social Determinants of Health   Financial Resource Strain: Low Risk  (03/24/2021)   Overall Financial Resource Strain (CARDIA)    Difficulty of Paying Living Expenses: Not very hard  Food Insecurity: Not on file  Transportation Needs: Not on file  Physical Activity: Not on file  Stress: Not on file  Social Connections: Not on file     Review of Systems  Constitutional:  Negative for appetite change and unexpected weight change.  HENT:  Negative for congestion, sinus pressure and sore throat.   Eyes:  Negative for pain and visual disturbance.  Respiratory:  Negative for cough, chest tightness and shortness of breath.   Cardiovascular:  Negative for chest pain, palpitations and leg swelling.  Gastrointestinal:  Negative for abdominal pain, diarrhea, nausea and vomiting.  Genitourinary:  Negative for difficulty urinating and dysuria.  Musculoskeletal:  Positive for back pain. Negative for joint swelling and myalgias.  Skin:  Negative for  color change and rash.  Neurological:  Negative for dizziness and headaches.  Hematological:  Negative for adenopathy. Does not bruise/bleed easily.  Psychiatric/Behavioral:  Negative for agitation and dysphoric mood.        Objective:     BP 128/78   Pulse 71   Temp 97.9 F (36.6 C)   Resp 16   Ht 5\' 10"  (1.778 m)   Wt (!) 372 lb (168.7 kg)   LMP 11/21/2012   SpO2 99%   BMI 53.38 kg/m  Wt Readings from Last 3 Encounters:  12/11/22 (!) 372 lb (168.7 kg)  08/11/22 (!) 377 lb 12.8 oz (171.4 kg)  05/07/22 (!) 383 lb (173.7 kg)    Physical Exam Vitals reviewed.  Constitutional:      General: She is not in acute distress.    Appearance: Normal appearance.  HENT:     Head: Normocephalic and atraumatic.     Right Ear: External ear normal.     Left Ear: External ear normal.  Eyes:     General: No scleral  icterus.       Right eye: No discharge.        Left eye: No discharge.     Conjunctiva/sclera: Conjunctivae normal.  Neck:     Thyroid: No thyromegaly.  Cardiovascular:     Rate and Rhythm: Normal rate and regular rhythm.  Pulmonary:     Effort: No respiratory distress.     Breath sounds: Normal breath sounds. No wheezing.  Abdominal:     General: Bowel sounds are normal.     Palpations: Abdomen is soft.     Tenderness: There is no abdominal tenderness.  Musculoskeletal:        General: No swelling or tenderness.     Cervical back: Neck supple. No tenderness.  Lymphadenopathy:     Cervical: No cervical adenopathy.  Skin:    Findings: No erythema or rash.  Neurological:     Mental Status: She is alert.  Psychiatric:        Mood and Affect: Mood normal.        Behavior: Behavior normal.      Outpatient Encounter Medications as of 12/11/2022  Medication Sig   acetaminophen (TYLENOL) 500 MG tablet Take 500 mg by mouth every 6 (six) hours as needed.   aspirin EC 81 MG tablet Take 81 mg by mouth daily. Swallow whole.   KLOR-CON M10 10 MEQ tablet TAKE 1 TABLET BY MOUTH EVERY DAY   metFORMIN (GLUCOPHAGE-XR) 500 MG 24 hr tablet TAKE 1 TABLET BY MOUTH EVERY DAY WITH BREAKFAST   rosuvastatin (CRESTOR) 40 MG tablet Take 1 tablet (40 mg total) by mouth daily.   Semaglutide, 2 MG/DOSE, 8 MG/3ML SOPN Inject 2 mg as directed once a week.   triamterene-hydrochlorothiazide (MAXZIDE-25) 37.5-25 MG tablet TAKE 1 TABLET BY MOUTH EVERY DAY   [DISCONTINUED] potassium chloride (K-DUR) 10 MEQ tablet TAKE 1 TABLET EVERY DAY   No facility-administered encounter medications on file as of 12/11/2022.     Lab Results  Component Value Date   WBC 5.2 12/08/2022   HGB 12.9 12/08/2022   HCT 39.7 12/08/2022   PLT 148 (L) 12/08/2022   GLUCOSE 102 (H) 12/08/2022   CHOL 177 12/08/2022   TRIG 93 12/08/2022   HDL 69 12/08/2022   LDLDIRECT 140.7 07/13/2013   LDLCALC 91 12/08/2022   ALT 16 12/08/2022    AST 16 12/08/2022   NA 141 12/08/2022   K 4.9 12/08/2022   CL  102 12/08/2022   CREATININE 1.05 (H) 12/08/2022   BUN 15 12/08/2022   CO2 21 12/08/2022   TSH 1.200 12/08/2022   HGBA1C 6.0 (H) 12/08/2022   MICROALBUR 0.9 01/21/2021    MM 3D SCREEN BREAST BILATERAL  Result Date: 02/05/2022 CLINICAL DATA:  Screening. EXAM: DIGITAL SCREENING BILATERAL MAMMOGRAM WITH TOMOSYNTHESIS AND CAD TECHNIQUE: Bilateral screening digital craniocaudal and mediolateral oblique mammograms were obtained. Bilateral screening digital breast tomosynthesis was performed. The images were evaluated with computer-aided detection. COMPARISON:  Previous exam(s). ACR Breast Density Category b: There are scattered areas of fibroglandular density. FINDINGS: There are no findings suspicious for malignancy. IMPRESSION: No mammographic evidence of malignancy. A result letter of this screening mammogram will be mailed directly to the patient. RECOMMENDATION: Screening mammogram in one year. (Code:SM-B-01Y) BI-RADS CATEGORY  1: Negative. Electronically Signed   By: Abelardo Diesel M.D.   On: 02/05/2022 11:16       Assessment & Plan:   Problem List Items Addressed This Visit     Type 2 diabetes mellitus with hyperglycemia ([place]) - Primary    On ozempic.   Low carb diet and exercise.  Follow met b and a1c. Has lost more weight.  Recent a1c 6.1.  Tolerating the medication. Follow.       Relevant Orders   Hemoglobin A1c   Thrombocytopenia (HCC)    Recheck cbc next labs to confirm stable.       Severe obesity (BMI >= 40) (HCC)    On ozempic.  Has lost more weight.  Feels better. Continue low carb diet and exercise.  Follow.       Low back pain    Flared recently as outlined.  Tylenol.  Follow.  Notify me if does not resolve.        Hypercholesteremia    crestor 20mg  now.  Low cholesterol diet and exercise.  Lab Results  Component Value Date   CHOL 177 12/08/2022   HDL 69 12/08/2022   LDLCALC 91 12/08/2022    LDLDIRECT 140.7 07/13/2013   TRIG 93 12/08/2022   CHOLHDL 2.6 12/08/2022       Relevant Orders   Lipid panel   Hepatic function panel   History of colonic polyps    Overdue colonoscopy.  Had referred to GI.  Was planning to do closer to end of year.  Needs colonoscopy.  Notify.       Essential hypertension, benign    Blood pressure doing well.  Continues on triam/hctz.  Follow pressures.  Follow metabolic panel.       Relevant Orders   Basic metabolic panel   Basic metabolic panel   Other Visit Diagnoses     Visit for screening mammogram       Relevant Orders   MM 3D SCREEN BREAST BILATERAL       Einar Pheasant, MD

## 2022-12-12 ENCOUNTER — Telehealth: Payer: Self-pay | Admitting: Internal Medicine

## 2022-12-12 ENCOUNTER — Encounter: Payer: Self-pay | Admitting: Internal Medicine

## 2022-12-12 DIAGNOSIS — M545 Low back pain, unspecified: Secondary | ICD-10-CM | POA: Insufficient documentation

## 2022-12-12 NOTE — Assessment & Plan Note (Signed)
On ozempic.   Low carb diet and exercise.  Follow met b and a1c. Has lost more weight.  Recent a1c 6.1.  Tolerating the medication. Follow.

## 2022-12-12 NOTE — Telephone Encounter (Signed)
Is overdue colonoscopy.  See if agreeable for referral.

## 2022-12-12 NOTE — Assessment & Plan Note (Signed)
Flared recently as outlined.  Tylenol.  Follow.  Notify me if does not resolve.

## 2022-12-12 NOTE — Assessment & Plan Note (Signed)
On ozempic.  Has lost more weight.  Feels better. Continue low carb diet and exercise.  Follow.

## 2022-12-12 NOTE — Assessment & Plan Note (Signed)
Overdue colonoscopy.  Had referred to GI.  Was planning to do closer to end of year.  Needs colonoscopy.  Notify.

## 2022-12-12 NOTE — Assessment & Plan Note (Signed)
Recheck cbc next labs to confirm stable.

## 2022-12-12 NOTE — Assessment & Plan Note (Signed)
crestor 20mg  now.  Low cholesterol diet and exercise.  Lab Results  Component Value Date   CHOL 177 12/08/2022   HDL 69 12/08/2022   LDLCALC 91 12/08/2022   LDLDIRECT 140.7 07/13/2013   TRIG 93 12/08/2022   CHOLHDL 2.6 12/08/2022

## 2022-12-12 NOTE — Assessment & Plan Note (Signed)
Blood pressure doing well.  Continues on triam/hctz.  Follow pressures.  Follow metabolic panel.

## 2022-12-14 NOTE — Telephone Encounter (Signed)
Colonoscopy is scheduled for Feb 2024. I postponed in HM while she was here so it will flag [place] to request report if do not receive.

## 2023-02-09 ENCOUNTER — Ambulatory Visit
Admission: RE | Admit: 2023-02-09 | Discharge: 2023-02-09 | Disposition: A | Discharge status: Home / Self Care | Payer: Managed Care, Other (non HMO) | Source: Ambulatory Visit | Attending: Internal Medicine | Admitting: Internal Medicine

## 2023-02-09 DIAGNOSIS — Z1231 Encounter for screening mammogram for malignant neoplasm of breast: Secondary | ICD-10-CM | POA: Diagnosis not present

## 2023-02-11 ENCOUNTER — Other Ambulatory Visit: Payer: Self-pay | Admitting: Internal Medicine

## 2023-03-22 ENCOUNTER — Other Ambulatory Visit: Payer: Self-pay | Admitting: Internal Medicine

## 2023-03-27 ENCOUNTER — Telehealth: Payer: Self-pay

## 2023-03-27 NOTE — Telephone Encounter (Signed)
Prior Auth on Ozempic sent to plan   Callan Beganovic (Key: [id number]) Ozempic (1 MG/DOSE) 4MG Ronny Bacon pen-injectors Form [employer] Electronic PA Form [phone] NCPDP)

## 2023-03-29 NOTE — Telephone Encounter (Signed)
Noted  

## 2023-04-10 ENCOUNTER — Other Ambulatory Visit: Payer: Self-pay | Admitting: Internal Medicine

## 2023-04-14 ENCOUNTER — Encounter: Payer: Self-pay | Admitting: Gastroenterology

## 2023-04-14 ENCOUNTER — Ambulatory Visit: Payer: Managed Care, Other (non HMO) | Admitting: Internal Medicine

## 2023-04-14 VITALS — BP 128/74 | HR 74 | Temp 97.9°F | Resp 16 | Ht 69.0 in | Wt 370.0 lb

## 2023-04-14 DIAGNOSIS — Z86010 Personal history of colonic polyps: Secondary | ICD-10-CM

## 2023-04-14 DIAGNOSIS — E7800 Pure hypercholesterolemia, unspecified: Secondary | ICD-10-CM | POA: Diagnosis not present

## 2023-04-14 DIAGNOSIS — Z0000 Encounter for general adult medical examination without abnormal findings: Secondary | ICD-10-CM | POA: Diagnosis not present

## 2023-04-14 DIAGNOSIS — D696 Thrombocytopenia, unspecified: Secondary | ICD-10-CM

## 2023-04-14 DIAGNOSIS — E1165 Type 2 diabetes mellitus with hyperglycemia: Secondary | ICD-10-CM

## 2023-04-14 DIAGNOSIS — Z7985 Long-term (current) use of injectable non-insulin antidiabetic drugs: Secondary | ICD-10-CM

## 2023-04-14 DIAGNOSIS — I10 Essential (primary) hypertension: Secondary | ICD-10-CM

## 2023-04-14 LAB — BASIC METABOLIC PANEL
BUN: 19 mg/dL (ref 6–23)
CO2: 28 mEq/L (ref 19–32)
Calcium: 9.2 mg/dL (ref 8.4–10.5)
Chloride: 105 mEq/L (ref 96–112)
Creatinine, Ser: 1.01 mg/dL (ref 0.40–1.20)
GFR: 60.38 mL/min (ref >60.00)
Glucose, Bld: 95 mg/dL (ref 70–99)
Potassium: 4.2 mEq/L (ref 3.5–5.1)
Sodium: 140 mEq/L (ref 135–145)

## 2023-04-14 LAB — LIPID PANEL
Cholesterol: 167 mg/dL (ref 0–200)
HDL: 62.3 mg/dL (ref >39.00)
LDL Cholesterol: 85 mg/dL (ref 0–99)
NonHDL: 104.86
Total CHOL/HDL Ratio: 3
Triglycerides: 100 mg/dL (ref 0.0–149.0)
VLDL: 20 mg/dL (ref 0.0–40.0)

## 2023-04-14 LAB — CBC WITH DIFFERENTIAL/PLATELET
Basophils Absolute: 0 10*3/uL (ref 0.0–0.1)
Basophils Relative: 0.8 % (ref 0.0–3.0)
Eosinophils Absolute: 0.1 10*3/uL (ref 0.0–0.7)
Eosinophils Relative: 2.6 % (ref 0.0–5.0)
HCT: 38.5 % (ref 36.0–46.0)
Hemoglobin: 12.4 g/dL (ref 12.0–15.0)
Lymphocytes Relative: 39.8 % (ref 12.0–46.0)
Lymphs Abs: 2.1 10*3/uL (ref 0.7–4.0)
MCHC: 32.3 g/dL (ref 30.0–36.0)
MCV: 85.8 fl (ref 78.0–100.0)
Monocytes Absolute: 0.4 10*3/uL (ref 0.1–1.0)
Monocytes Relative: 7.2 % (ref 3.0–12.0)
Neutro Abs: 2.6 10*3/uL (ref 1.4–7.7)
Neutrophils Relative %: 49.6 % (ref 43.0–77.0)
Platelets: 118 10*3/uL — ABNORMAL LOW (ref 150.0–400.0)
RBC: 4.49 Mil/uL (ref 3.87–5.11)
RDW: 16.9 % — ABNORMAL HIGH (ref 11.5–15.5)
WBC: 5.2 10*3/uL (ref 4.0–10.5)

## 2023-04-14 LAB — HEPATIC FUNCTION PANEL
ALT: 13 U/L (ref 0–35)
AST: 16 U/L (ref 0–37)
Albumin: 4 g/dL (ref 3.5–5.2)
Alkaline Phosphatase: 68 U/L (ref 39–117)
Bilirubin, Direct: 0.1 mg/dL (ref 0.0–0.3)
Total Bilirubin: 0.4 mg/dL (ref 0.2–1.2)
Total Protein: 7.5 g/dL (ref 6.0–8.3)

## 2023-04-14 LAB — MICROALBUMIN / CREATININE URINE RATIO
Creatinine,U: 79.8 mg/dL
Microalb Creat Ratio: 0.9 mg/g (ref 0.0–30.0)
Microalb, Ur: <0.7 mg/dL (ref 0.0–1.9)

## 2023-04-14 LAB — HEMOGLOBIN A1C: Hgb A1c MFr Bld: 6.1 % (ref 4.6–6.5)

## 2023-04-14 MED ORDER — TRIAMTERENE-HCTZ 37.5-25 MG PO TABS: 1.0000 | ORAL_TABLET | Freq: Every day | ORAL | 1 refills | Status: DC

## 2023-04-14 MED ORDER — POTASSIUM CHLORIDE CRYS ER 10 MEQ PO TBCR: 10.0000 meq | EXTENDED_RELEASE_TABLET | Freq: Every day | ORAL | 1 refills | Status: DC

## 2023-04-14 NOTE — Assessment & Plan Note (Addendum)
Physical today 04/14/23  PAP 01/07/22 - negative with negative HPV. Colonoscopy scheduled for tomorrow.  Mammogram 02/09/23 - birads I.

## 2023-04-14 NOTE — Progress Notes (Signed)
Subjective:    Patient ID: Morgan Duncan, female    DOB: 10-Dec-1960, 62 y.o.   MRN: [id number]  Patient here for  Chief Complaint  Patient presents with   Annual Exam    HPI Here for physical exam.  Doing well.  Is exercising.  Has lost a couple of more pounds. On ozempic.   Is not checking sugars.  She is watching her diet.  No chest pain or sob reported.  No abdominal pain or bowel change reported.  Colonoscopy tomorrow.  Has changed jobs.  Handling stress.     Past Medical History:  Diagnosis Date   Asymptomatic cholelithiasis    Diabetes mellitus without complication (HCC)    Hypertension    Mammographic microcalcification 11/16/2013   Thrombocytopenia (HCC)    Recent platelet count 151 on 11/01/08   Tobacco use    Past Surgical History:  Procedure Laterality Date   BREAST BIOPSY Left 2012   NEG   COLONOSCOPY N/A 04/15/2023   Procedure: COLONOSCOPY;  Surgeon: Jaynie Collins, DO;  Location[facility] ENDOSCOPY;  Service: Gastroenterology;  Laterality: N/A;   KNEE SURGERY Left 3/11   left knee    Family History  Problem Relation Age of Onset   Hypertension Mother    Diabetes Mother    Hypothyroidism Mother    Heart disease Father        Died of a Heart Attack    Breast cancer Neg Hx    Social History   Socioeconomic History   Marital status: Single    Spouse name: Not on file   Number of children: Not on file   Years of education: Not on file   Highest education level: Not on file  Occupational History   Not on file  Tobacco Use   Smoking status: Former    Packs/day: 0.25    Years: 12.00    Additional pack years: 0.00    Total pack years: 3.00    Types: Cigarettes   Smokeless tobacco: Never   Tobacco comments:    Quit March 2016  Vaping Use   Vaping Use: Never used  Substance and Sexual Activity   Alcohol use: Yes    Alcohol/week: 0.0 standard drinks of alcohol    Comment: occasionally   Drug use: No   Sexual activity: Not on file  Other  Topics Concern   Not on file  Social History Narrative   Not on file   Social Determinants of Health   Financial Resource Strain: Low Risk  (03/24/2021)   Overall Financial Resource Strain (CARDIA)    Difficulty of Paying Living Expenses: Not very hard  Food Insecurity: Not on file  Transportation Needs: Not on file  Physical Activity: Not on file  Stress: Not on file  Social Connections: Not on file     Review of Systems  Constitutional:  Negative for appetite change and unexpected weight change.  HENT:  Negative for congestion, sinus pressure and sore throat.   Eyes:  Negative for pain and visual disturbance.  Respiratory:  Negative for cough, chest tightness and shortness of breath.   Cardiovascular:  Negative for chest pain and palpitations.  Gastrointestinal:  Negative for abdominal pain, diarrhea, nausea and vomiting.  Genitourinary:  Negative for difficulty urinating and dysuria.  Musculoskeletal:  Negative for joint swelling and myalgias.  Skin:  Negative for color change and rash.  Neurological:  Negative for dizziness and headaches.  Hematological:  Negative for adenopathy. Does not bruise/bleed easily.  Psychiatric/Behavioral:  Negative for agitation and dysphoric mood.        Objective:     BP 128/74   Pulse 74   Temp 97.9 F (36.6 C)   Resp 16   Ht 5\' 9"  (1.753 m)   Wt (!) 370 lb (167.8 kg)   LMP 11/21/2012   SpO2 99%   BMI 54.64 kg/m  Wt Readings from Last 3 Encounters:  04/15/23 (!) 366 lb (166 kg)  04/14/23 (!) 370 lb (167.8 kg)  12/11/22 (!) 372 lb (168.7 kg)    Physical Exam Vitals reviewed.  Constitutional:      General: She is not in acute distress.    Appearance: Normal appearance. She is well-developed.  HENT:     Head: Normocephalic and atraumatic.     Right Ear: External ear normal.     Left Ear: External ear normal.  Eyes:     General: No scleral icterus.       Right eye: No discharge.        Left eye: No discharge.      Conjunctiva/sclera: Conjunctivae normal.  Neck:     Thyroid: No thyromegaly.  Cardiovascular:     Rate and Rhythm: Normal rate and regular rhythm.  Pulmonary:     Effort: No tachypnea, accessory muscle usage or respiratory distress.     Breath sounds: Normal breath sounds. No decreased breath sounds or wheezing.  Chest:  Breasts:    Right: No inverted nipple, mass, nipple discharge or tenderness (no axillary adenopathy).     Left: No inverted nipple, mass, nipple discharge or tenderness (no axilarry adenopathy).  Abdominal:     General: Bowel sounds are normal.     Palpations: Abdomen is soft.     Tenderness: There is no abdominal tenderness.  Musculoskeletal:        General: No swelling or tenderness.     Cervical back: Neck supple.  Lymphadenopathy:     Cervical: No cervical adenopathy.  Skin:    Findings: No erythema or rash.  Neurological:     Mental Status: She is alert and oriented to person, place, and time.  Psychiatric:        Mood and Affect: Mood normal.        Behavior: Behavior normal.      Outpatient Encounter Medications as of 04/14/2023  Medication Sig   acetaminophen (TYLENOL) 500 MG tablet Take 500 mg by mouth every 6 (six) hours as needed.   aspirin EC 81 MG tablet Take 81 mg by mouth daily. Swallow whole.   metFORMIN (GLUCOPHAGE-XR) 500 MG 24 hr tablet TAKE 1 TABLET BY MOUTH EVERY DAY WITH BREAKFAST   potassium chloride (KLOR-CON M10) 10 MEQ tablet Take 1 tablet (10 mEq total) by mouth daily.   rosuvastatin (CRESTOR) 40 MG tablet TAKE 1 TABLET BY MOUTH EVERY DAY   Semaglutide, 2 MG/DOSE, (OZEMPIC, 2 MG/DOSE,) 8 MG/3ML SOPN INJECT 2 MG AS DIRECTED INTO THE SKIN ONCE A WEEK.   triamterene-hydrochlorothiazide (MAXZIDE-25) 37.5-25 MG tablet Take 1 tablet by mouth daily.   [DISCONTINUED] KLOR-CON M10 10 MEQ tablet TAKE 1 TABLET BY MOUTH EVERY DAY   [DISCONTINUED] potassium chloride (K-DUR) 10 MEQ tablet TAKE 1 TABLET EVERY DAY   [DISCONTINUED]  triamterene-hydrochlorothiazide (MAXZIDE-25) 37.5-25 MG tablet TAKE 1 TABLET BY MOUTH EVERY DAY   No facility-administered encounter medications on file as of 04/14/2023.     Lab Results  Component Value Date   WBC 5.2 04/14/2023   HGB 12.4 04/14/2023  HCT 38.5 04/14/2023   PLT 118.0 (L) 04/14/2023   GLUCOSE 95 04/14/2023   CHOL 167 04/14/2023   TRIG 100.0 04/14/2023   HDL 62.30 04/14/2023   LDLDIRECT 140.7 07/13/2013   LDLCALC 85 04/14/2023   ALT 13 04/14/2023   AST 16 04/14/2023   NA 140 04/14/2023   K 4.2 04/14/2023   CL 105 04/14/2023   CREATININE 1.01 04/14/2023   BUN 19 04/14/2023   CO2 28 04/14/2023   TSH 1.200 12/08/2022   HGBA1C 6.1 04/14/2023   MICROALBUR <0.7 04/14/2023    MM 3D SCREEN BREAST BILATERAL  Result Date: 02/10/2023 CLINICAL DATA:  Screening. EXAM: DIGITAL SCREENING BILATERAL MAMMOGRAM WITH TOMOSYNTHESIS AND CAD TECHNIQUE: Bilateral screening digital craniocaudal and mediolateral oblique mammograms were obtained. Bilateral screening digital breast tomosynthesis was performed. The images were evaluated with computer-aided detection. COMPARISON:  Previous exam(s). ACR Breast Density Category b: There are scattered areas of fibroglandular density. FINDINGS: There are no findings suspicious for malignancy. IMPRESSION: No mammographic evidence of malignancy. A result letter of this screening mammogram will be mailed directly to the patient. RECOMMENDATION: Screening mammogram in one year. (Code:SM-B-01Y) BI-RADS CATEGORY  1: Negative. Electronically Signed   By: Beckie Salts M.D.   On: 02/10/2023 16:29       Assessment & Plan:  Health care maintenance Assessment & Plan: Physical today 04/14/23  PAP 01/07/22 - negative with negative HPV. Colonoscopy scheduled for tomorrow.  Mammogram 02/09/23 - birads I.     Type 2 diabetes mellitus with hyperglycemia, without long-term current use of insulin (HCC) Assessment & Plan: On ozempic.   Low carb diet and exercise.   Follow met b and A1c. Tolerating the medication. Follow.   Orders: -     Hemoglobin A1c -     Microalbumin / creatinine urine ratio  Hypercholesteremia Assessment & Plan: crestor 20mg  now.  Low cholesterol diet and exercise.  Lab Results  Component Value Date   CHOL 167 04/14/2023   HDL 62.30 04/14/2023   LDLCALC 85 04/14/2023   LDLDIRECT 140.7 07/13/2013   TRIG 100.0 04/14/2023   CHOLHDL 3 04/14/2023    Orders: -     Lipid panel -     Basic metabolic panel -     Hepatic function panel  Essential hypertension, benign Assessment & Plan: Blood pressure doing well.  Continues on triam/hctz.  Follow pressures.  Follow metabolic panel.   Orders: -     Potassium Chloride Crys ER; Take 1 tablet (10 mEq total) by mouth daily.  Dispense: 90 tablet; Refill: 1  Thrombocytopenia (HCC) Assessment & Plan: Recheck cbc next labs to confirm stable.   Orders: -     CBC with Differential/Platelet  History of colonic polyps Assessment & Plan: Colonoscopy scheduled for tomorrow.    Severe obesity (BMI >= 40) (HCC) Assessment & Plan: Is exercising.  Has lost a couple of more pounds.  On ozempic.  Not checking sugars.  Is watching her diet.  Low carb diet and exercise.  Follow.    Other orders -     Triamterene-HCTZ; Take 1 tablet by mouth daily.  Dispense: 90 tablet; Refill: 1     [place], MD

## 2023-04-15 ENCOUNTER — Ambulatory Visit: Payer: Managed Care, Other (non HMO) | Admitting: Certified Registered Nurse Anesthetist

## 2023-04-15 ENCOUNTER — Other Ambulatory Visit: Payer: Self-pay

## 2023-04-15 ENCOUNTER — Encounter
Admission: RE | Disposition: A | Discharge status: Home / Self Care | Payer: Self-pay | Source: Ambulatory Visit | Attending: Gastroenterology

## 2023-04-15 ENCOUNTER — Ambulatory Visit
Admission: RE | Admit: 2023-04-15 | Discharge: 2023-04-15 | Disposition: A | Discharge status: Home / Self Care | Payer: Managed Care, Other (non HMO) | Source: Ambulatory Visit | Attending: Gastroenterology | Admitting: Gastroenterology

## 2023-04-15 ENCOUNTER — Encounter: Payer: Self-pay | Admitting: Gastroenterology

## 2023-04-15 DIAGNOSIS — D124 Benign neoplasm of descending colon: Secondary | ICD-10-CM | POA: Diagnosis not present

## 2023-04-15 DIAGNOSIS — E119 Type 2 diabetes mellitus without complications: Secondary | ICD-10-CM | POA: Diagnosis not present

## 2023-04-15 DIAGNOSIS — K635 Polyp of colon: Secondary | ICD-10-CM | POA: Diagnosis not present

## 2023-04-15 DIAGNOSIS — I10 Essential (primary) hypertension: Secondary | ICD-10-CM | POA: Diagnosis not present

## 2023-04-15 DIAGNOSIS — R7989 Other specified abnormal findings of blood chemistry: Secondary | ICD-10-CM

## 2023-04-15 DIAGNOSIS — Z1211 Encounter for screening for malignant neoplasm of colon: Secondary | ICD-10-CM | POA: Diagnosis present

## 2023-04-15 DIAGNOSIS — Z87891 Personal history of nicotine dependence: Secondary | ICD-10-CM | POA: Diagnosis not present

## 2023-04-15 DIAGNOSIS — Z6841 Body Mass Index (BMI) 40.0 and over, adult: Secondary | ICD-10-CM | POA: Insufficient documentation

## 2023-04-15 HISTORY — DX: Type 2 diabetes mellitus without complications: E11.9

## 2023-04-15 HISTORY — PX: COLONOSCOPY: SHX5424

## 2023-04-15 LAB — GLUCOSE, CAPILLARY: Glucose-Capillary: 99 mg/dL (ref 70–99)

## 2023-04-15 SURGERY — COLONOSCOPY
Anesthesia: General

## 2023-04-15 MED ORDER — SODIUM CHLORIDE 0.9 % IV SOLN: INTRAVENOUS | Status: DC

## 2023-04-15 MED ORDER — PROPOFOL 500 MG/50ML IV EMUL
INTRAVENOUS | Status: DC | PRN
  Administered 2023-04-15: 150 ug/kg/min via INTRAVENOUS

## 2023-04-15 MED ORDER — PROPOFOL 10 MG/ML IV BOLUS
INTRAVENOUS | Status: DC | PRN
  Administered 2023-04-15: 70 mg via INTRAVENOUS

## 2023-04-15 MED ORDER — LIDOCAINE HCL (CARDIAC) PF 100 MG/5ML IV SOSY
PREFILLED_SYRINGE | INTRAVENOUS | Status: DC | PRN
  Administered 2023-04-15: 50 mg via INTRAVENOUS

## 2023-04-15 NOTE — Anesthesia Preprocedure Evaluation (Signed)
Anesthesia Evaluation  Patient identified by MRN, date of birth, ID band Patient awake    Reviewed: Allergy & Precautions, NPO status , Patient's Chart, lab work & pertinent test results  History of Anesthesia Complications Negative for: history of anesthetic complications  Airway Mallampati: I  TM Distance: >3 FB Neck ROM: Full    Dental no notable dental hx. (+) Teeth Intact   Pulmonary neg pulmonary ROS, neg sleep apnea, neg COPD, Patient abstained from smoking.Not current smoker, former smoker   Pulmonary exam normal breath sounds clear to auscultation       Cardiovascular Exercise Tolerance: Good METShypertension, Pt. on medications (-) CAD and (-) Past MI (-) dysrhythmias  Rhythm:Regular Rate:Normal - Systolic murmurs    Neuro/Psych negative neurological ROS  negative psych ROS   GI/Hepatic ,neg GERD  ,,(+)     (-) substance abuse    Endo/Other  diabetes  Morbid obesityOn GLP1 ozempic, last taken 10 days ago. Denies GI symptoms today  Renal/GU negative Renal ROS     Musculoskeletal   Abdominal  (+) + obese  Peds  Hematology   Anesthesia Other Findings Past Medical History: No date: Asymptomatic cholelithiasis No date: Diabetes mellitus without complication (HCC) No date: Hypertension 11/16/2013: Mammographic microcalcification No date: Thrombocytopenia (HCC)     Comment:  Recent platelet count 151 on 11/01/08 No date: Tobacco use  Reproductive/Obstetrics                             Anesthesia Physical Anesthesia Plan  ASA: 3  Anesthesia Plan: General   Post-op Pain Management: Minimal or no pain anticipated   Induction: Intravenous  PONV Risk Score and Plan: 3 and Propofol infusion, TIVA and Ondansetron  Airway Management Planned: Nasal Cannula  Additional Equipment: None  Intra-op Plan:   Post-operative Plan:   Informed Consent: I have reviewed the patients  History and Physical, chart, labs and discussed the procedure including the risks, benefits and alternatives for the proposed anesthesia with the patient or authorized representative who has indicated his/her understanding and acceptance.     Dental advisory given  Plan Discussed with: CRNA and Surgeon  Anesthesia Plan Comments: (Discussed risks of anesthesia with patient, including possibility of difficulty with spontaneous ventilation under anesthesia necessitating airway intervention, PONV, and rare risks such as cardiac or respiratory or neurological events, and allergic reactions. Discussed the role of CRNA in patient's perioperative care. Patient understands. Patient informed about increased incidence of above perioperative risk due to high BMI. Patient understands.  )       Anesthesia Quick Evaluation

## 2023-04-15 NOTE — Op Note (Signed)
[facility] Gastroenterology Patient Name: Morgan Duncan Procedure Date: 04/15/2023 11:19 AM MRN: [id number] Account #: [id number] Date of Birth: [birth year] Admit Type: Outpatient Age: 62 Room: [facility] ENDO ROOM 1 Gender: Female Note Status: Finalized Instrument Name: Colonoscope [id number] Procedure:             Colonoscopy Indications:           Screening for colorectal malignant neoplasm Providers:             Jaynie Collins DO, DO Referring MD:          Dale Longville, MD (Referring MD) Medicines:             Monitored Anesthesia Care Complications:         No immediate complications. Estimated blood loss:                         Minimal. Procedure:             Pre-Anesthesia Assessment:                        - Prior to the procedure, a History and Physical was                         performed, and patient medications and allergies were                         reviewed. The patient is competent. The risks and                         benefits of the procedure and the sedation options and                         risks were discussed with the patient. All questions                         were answered and informed consent was obtained.                         Patient identification and proposed procedure were                         verified by the physician, the nurse, the anesthetist                         and the technician in the endoscopy suite. Mental                         Status Examination: alert and oriented. Airway                         Examination: normal oropharyngeal airway and neck                         mobility. Respiratory Examination: clear to                         auscultation. CV Examination: RRR, no murmurs, no S3  or S4. Prophylactic Antibiotics: The patient does not                         require prophylactic antibiotics. Prior                         Anticoagulants: The patient has taken no anticoagulant                          or antiplatelet agents. ASA Grade Assessment: III - A                         patient with severe systemic disease. After reviewing                         the risks and benefits, the patient was deemed in                         satisfactory condition to undergo the procedure. The                         anesthesia plan was to use monitored anesthesia care                         (MAC). Immediately prior to administration of                         medications, the patient was re-assessed for adequacy                         to receive sedatives. The heart rate, respiratory                         rate, oxygen saturations, blood pressure, adequacy of                         pulmonary ventilation, and response to care were                         monitored throughout the procedure. The physical                         status of the patient was re-assessed after the                         procedure.                        After obtaining informed consent, the colonoscope was                         passed under direct vision. Throughout the procedure,                         the patient's blood pressure, pulse, and oxygen                         saturations were monitored continuously. The  Colonoscope was introduced through the anus and                         advanced to the the cecum, identified by appendiceal                         orifice and ileocecal valve. The colonoscopy was                         performed without difficulty. The patient tolerated                         the procedure well. The quality of the bowel                         preparation was evaluated using the BBPS [facility] Bowel                         Preparation Scale) with scores of: Right Colon = 2                         (minor amount of residual staining, small fragments of                         stool and/or opaque liquid, but mucosa seen well),                          Transverse Colon = 3 (entire mucosa seen well with no                         residual staining, small fragments of stool or opaque                         liquid) and Left Colon = 2 (minor amount of residual                         staining, small fragments of stool and/or opaque                         liquid, but mucosa seen well). The total BBPS score                         equals 7. The quality of the bowel preparation was                         good. The ileocecal valve, appendiceal orifice, and                         rectum were photographed. Findings:      The perianal and digital rectal examinations were normal. Pertinent       negatives include normal sphincter tone.      A 1 to 2 mm polyp was found in the rectum. The polyp was sessile. The       polyp was removed with a jumbo cold forceps. Resection and retrieval       were complete. Estimated blood loss was minimal.      A  3 to 4 mm polyp was found in the transverse colon. The polyp was       sessile. The polyp was removed with a cold snare. Resection was       complete, but the polyp tissue was not retrieved. Estimated blood loss       was minimal.      A 3 to 4 mm polyp was found in the descending colon. The polyp was       sessile. The polyp was removed with a cold snare. Resection and       retrieval were complete. Estimated blood loss was minimal.      The exam was otherwise without abnormality on direct and retroflexion       views. Impression:            - One 1 to 2 mm polyp in the rectum, removed with a                         jumbo cold forceps. Resected and retrieved.                        - One 3 to 4 mm polyp in the transverse colon, removed                         with a cold snare. Complete resection. Polyp tissue                         not retrieved.                        - One 3 to 4 mm polyp in the descending colon, removed                         with a cold snare. Resected and retrieved.                         - The examination was otherwise normal on direct and                         retroflexion views. Recommendation:        - Patient has a contact number available for                         emergencies. The signs and symptoms of potential                         delayed complications were discussed with the patient.                         Return to normal activities tomorrow. Written                         discharge instructions were provided to the patient.                        - Discharge patient to home.                        - Resume previous diet.                        -  Continue present medications.                        - No ibuprofen, naproxen, or other non-steroidal                         anti-inflammatory drugs for 5 days after polyp removal.                        - Await pathology results.                        - Repeat colonoscopy for surveillance based on                         pathology results.                        - Return to referring physician as previously                         scheduled.                        - The findings and recommendations were discussed with                         the patient. Procedure Code(s):     --- Professional ---                        [phone], Colonoscopy, flexible; with removal of                         tumor(s), polyp(s), or other lesion(s) by snare                         technique                        [id number], 59, Colonoscopy, flexible; with biopsy, single                         or multiple Diagnosis Code(s):     --- Professional ---                        Z12.11, Encounter for screening for malignant neoplasm                         of colon                        D12.8, Benign neoplasm of rectum                        D12.3, Benign neoplasm of transverse colon (hepatic                         flexure or splenic flexure)                        D12.4, Benign neoplasm of descending colon CPT  copyright 2022 American Medical Association. All rights reserved.  The codes documented in this report are preliminary and upon coder review may  be revised to meet current compliance requirements. Attending Participation:      I personally performed the entire procedure. Elfredia Nevins, DO Jaynie Collins DO, DO 04/15/2023 12:23:14 PM This report has been signed electronically. Number of Addenda: 0 Note Initiated On: 04/15/2023 11:19 AM Scope Withdrawal Time: 0 hours 14 minutes 36 seconds  Total Procedure Duration: 0 hours 24 minutes 18 seconds  Estimated Blood Loss:  Estimated blood loss was minimal.      [facility]

## 2023-04-15 NOTE — H&P (Signed)
Pre-Procedure H&P   Patient ID: Morgan Duncan is a 62 y.o. female.  Gastroenterology Provider: Jaynie Collins, DO  Referring Provider: Dr. Lorin Picket PCP: Dale Saltillo, MD  Date: 04/15/2023  HPI [name] is a 62 y.o. female who presents today for Colonoscopy for Colorectal cancer screening .  Patient last underwent colonoscopy in June 2010 with hyperplastic polyps.  No family history colon cancer or colon polyps.  Internal hemorrhoids were also noted at that time.  Patient notes constipation with bowel movements 2 times a week.  No melena or hematochezia  Most recent hemoglobin 13 platelets 166,000 creatinine 0.8  Currently on Ozempic which has been held for the procedure (5/20 last dose)   Past Medical History:  Diagnosis Date   Asymptomatic cholelithiasis    Diabetes mellitus without complication (HCC)    Hypertension    Mammographic microcalcification 11/16/2013   Thrombocytopenia (HCC)    Recent platelet count 151 on 11/01/08   Tobacco use     Past Surgical History:  Procedure Laterality Date   BREAST BIOPSY Left 2012   NEG   KNEE SURGERY Left 3/11   left knee     Family History No h/o GI disease or malignancy  Review of Systems  Constitutional:  Negative for activity change, appetite change, chills, diaphoresis, fatigue, fever and unexpected weight change.  HENT:  Negative for trouble swallowing and voice change.   Respiratory:  Negative for shortness of breath and wheezing.   Cardiovascular:  Negative for chest pain, palpitations and leg swelling.  Gastrointestinal:  Negative for abdominal distention, abdominal pain, anal bleeding, blood in stool, constipation, diarrhea, nausea, rectal pain and vomiting.  Musculoskeletal:  Negative for arthralgias and myalgias.  Skin:  Negative for color change and pallor.  Neurological:  Negative for dizziness, syncope and weakness.  Psychiatric/Behavioral:  Negative for confusion.   All other  systems reviewed and are negative.    Medications No current facility-administered medications on file prior to encounter.   Current Outpatient Medications on File Prior to Encounter  Medication Sig Dispense Refill   acetaminophen (TYLENOL) 500 MG tablet Take 500 mg by mouth every 6 (six) hours as needed.     aspirin EC 81 MG tablet Take 81 mg by mouth daily. Swallow whole.     [DISCONTINUED] potassium chloride (K-DUR) 10 MEQ tablet TAKE 1 TABLET EVERY DAY 30 tablet 5    Pertinent medications related to GI and procedure were reviewed by me with the patient prior to the procedure   Current Facility-Administered Medications:    0.9 %  sodium chloride infusion, , Intravenous, Continuous, Jaynie Collins, DO, Last Rate: 20 mL/hr at 04/15/23 1133, New Bag at 04/15/23 1133  sodium chloride 20 mL/hr at 04/15/23 1133       No Known Allergies Allergies were reviewed by me prior to the procedure  Objective   Body mass index is 54.05 kg/m. Vitals:   04/15/23 1108  BP: (!) 164/89  Pulse: 79  Resp: 18  Temp: (!) 96.9 F (36.1 C)  TempSrc: Temporal  SpO2: 100%  Weight: (!) 166 kg  Height: 5\' 9"  (1.753 m)     Physical Exam Vitals and nursing note reviewed.  Constitutional:      General: She is not in acute distress.    Appearance: Normal appearance. She is obese. She is not ill-appearing, toxic-appearing or diaphoretic.  HENT:     Head: Normocephalic and atraumatic.     Nose: Nose normal.  Mouth/Throat:     Mouth: Mucous membranes are moist.     Pharynx: Oropharynx is clear.  Eyes:     General: No scleral icterus.    Extraocular Movements: Extraocular movements intact.  Cardiovascular:     Rate and Rhythm: Normal rate and regular rhythm.     Heart sounds: Normal heart sounds. No murmur heard.    No friction rub. No gallop.  Pulmonary:     Effort: Pulmonary effort is normal. No respiratory distress.     Breath sounds: Normal breath sounds. No wheezing, rhonchi  or rales.  Abdominal:     General: Bowel sounds are normal. There is no distension.     Palpations: Abdomen is soft.     Tenderness: There is no abdominal tenderness. There is no guarding or rebound.  Musculoskeletal:     Cervical back: Neck supple.     Right lower leg: No edema.     Left lower leg: No edema.  Skin:    General: Skin is warm and dry.     Coloration: Skin is not jaundiced or pale.  Neurological:     General: No focal deficit present.     Mental Status: She is alert and oriented to person, place, and time. Mental status is at baseline.  Psychiatric:        Mood and Affect: Mood normal.        Behavior: Behavior normal.        Thought Content: Thought content normal.        Judgment: Judgment normal.      Assessment:  [name] is a 63 y.o. female  who presents today for Colonoscopy for Colorectal cancer screening .  Plan:  Colonoscopy with possible intervention today  Colonoscopy with possible biopsy, control of bleeding, polypectomy, and interventions as necessary has been discussed with the patient/patient representative. Informed consent was obtained from the patient/patient representative after explaining the indication, nature, and risks of the procedure including but not limited to death, bleeding, perforation, missed neoplasm/lesions, cardiorespiratory compromise, and reaction to medications. Opportunity for questions was given and appropriate answers were provided. Patient/patient representative has verbalized understanding is amenable to undergoing the procedure.   Jaynie Collins, DO  [facility] Gastroenterology  Portions of the record may have been created with voice recognition software. Occasional wrong-word or 'sound-a-like' substitutions may have occurred due to the inherent limitations of voice recognition software.  Read the chart carefully and recognize, using context, where substitutions may have occurred.

## 2023-04-15 NOTE — Anesthesia Procedure Notes (Signed)
Date/Time: 04/15/2023 11:50 AM  Performed by: Ginger Carne, CRNAPre-anesthesia Checklist: Patient identified, Emergency Drugs available, Suction available and Patient being monitored Patient Re-evaluated:Patient Re-evaluated prior to induction Oxygen Delivery Method: Nasal cannula Preoxygenation: Pre-oxygenation with 100% oxygen Induction Type: IV induction

## 2023-04-15 NOTE — Anesthesia Postprocedure Evaluation (Signed)
Anesthesia Post Note  Patient: Morgan Duncan  Procedure(s) Performed: COLONOSCOPY  Patient location during evaluation: Endoscopy Anesthesia Type: General Level of consciousness: awake and alert Pain management: pain level controlled Vital Signs Assessment: post-procedure vital signs reviewed and stable Respiratory status: spontaneous breathing, nonlabored ventilation, respiratory function stable and patient connected to nasal cannula oxygen Cardiovascular status: blood pressure returned to baseline and stable Postop Assessment: no apparent nausea or vomiting Anesthetic complications: no   No notable events documented.   Last Vitals:  Vitals:   04/15/23 1225 04/15/23 1237  BP: 106/63 110/67  Pulse:    Resp: (!) 23 16  Temp:    SpO2:  100%    Last Pain:  Vitals:   04/15/23 1223  TempSrc:   PainSc: 0-No pain                 Corinda Gubler

## 2023-04-15 NOTE — Transfer of Care (Signed)
Immediate Anesthesia Transfer of Care Note  Patient: Morgan Duncan  Procedure(s) Performed: COLONOSCOPY  Patient Location: Endoscopy Unit  Anesthesia Type:General  Level of Consciousness: awake, alert , and oriented  Airway & Oxygen Therapy: Patient Spontanous Breathing  Post-op Assessment: Report given to RN and Post -op Vital signs reviewed and stable  Post vital signs: Reviewed and stable  Last Vitals:  Vitals Value Taken Time  BP 106/63 04/15/23 1225  Temp    Pulse    Resp 23 04/15/23 1225  SpO2      Last Pain:  Vitals:   04/15/23 1108  TempSrc: Temporal  PainSc: 0-No pain         Complications: No notable events documented.

## 2023-04-15 NOTE — Interval H&P Note (Signed)
History and Physical Interval Note: Preprocedure H&P from 04/15/23  was reviewed and there was no interval change after seeing and examining the patient.  Written consent was obtained from the patient after discussion of risks, benefits, and alternatives. Patient has consented to proceed with Colonoscopy with possible intervention   04/15/2023 11:41 AM  Morgan Duncan  has presented today for surgery, with the diagnosis of Colon cancer screening (Z12.11).  The various methods of treatment have been discussed with the patient and family. After consideration of risks, benefits and other options for treatment, the patient has consented to  Procedure(s): COLONOSCOPY (N/A) as a surgical intervention.  The patient's history has been reviewed, patient examined, no change in status, stable for surgery.  I have reviewed the patient's chart and labs.  Questions were answered to the patient's satisfaction.     Jaynie Collins

## 2023-04-16 ENCOUNTER — Encounter: Payer: Self-pay | Admitting: Gastroenterology

## 2023-04-16 LAB — SURGICAL PATHOLOGY

## 2023-04-17 ENCOUNTER — Encounter: Payer: Self-pay | Admitting: Internal Medicine

## 2023-04-17 NOTE — Assessment & Plan Note (Signed)
Blood pressure doing well.  Continues on triam/hctz.  Follow pressures.  Follow metabolic panel.  

## 2023-04-17 NOTE — Assessment & Plan Note (Signed)
On ozempic.   Low carb diet and exercise.  Follow met b and A1c. Tolerating the medication. Follow.

## 2023-04-17 NOTE — Assessment & Plan Note (Signed)
Colonoscopy scheduled for tomorrow.

## 2023-04-17 NOTE — Assessment & Plan Note (Signed)
Is exercising.  Has lost a couple of more pounds.  On ozempic.  Not checking sugars.  Is watching her diet.  Low carb diet and exercise.  Follow.

## 2023-04-17 NOTE — Assessment & Plan Note (Signed)
Recheck cbc next labs to confirm stable.  

## 2023-04-17 NOTE — Assessment & Plan Note (Signed)
crestor 20mg  now.  Low cholesterol diet and exercise.  Lab Results  Component Value Date   CHOL 167 04/14/2023   HDL 62.30 04/14/2023   LDLCALC 85 04/14/2023   LDLDIRECT 140.7 07/13/2013   TRIG 100.0 04/14/2023   CHOLHDL 3 04/14/2023

## 2023-04-27 ENCOUNTER — Other Ambulatory Visit: Payer: Managed Care, Other (non HMO)

## 2023-08-17 ENCOUNTER — Ambulatory Visit (INDEPENDENT_AMBULATORY_CARE_PROVIDER_SITE_OTHER): Payer: Managed Care, Other (non HMO) | Admitting: Internal Medicine

## 2023-08-17 VITALS — BP 110/72 | HR 67 | Temp 97.8°F | Resp 16 | Ht 69.0 in | Wt 367.0 lb

## 2023-08-17 DIAGNOSIS — E1165 Type 2 diabetes mellitus with hyperglycemia: Secondary | ICD-10-CM | POA: Diagnosis not present

## 2023-08-17 DIAGNOSIS — I10 Essential (primary) hypertension: Secondary | ICD-10-CM

## 2023-08-17 DIAGNOSIS — D696 Thrombocytopenia, unspecified: Secondary | ICD-10-CM

## 2023-08-17 DIAGNOSIS — Z7985 Long-term (current) use of injectable non-insulin antidiabetic drugs: Secondary | ICD-10-CM | POA: Diagnosis not present

## 2023-08-17 DIAGNOSIS — E7800 Pure hypercholesterolemia, unspecified: Secondary | ICD-10-CM

## 2023-08-17 DIAGNOSIS — Z6841 Body Mass Index (BMI) 40.0 and over, adult: Secondary | ICD-10-CM

## 2023-08-17 LAB — HM DIABETES FOOT EXAM

## 2023-08-17 NOTE — Progress Notes (Signed)
Subjective:    Patient ID: Morgan Duncan, female    DOB: 16-Jan-1961, 62 y.o.   MRN: [id number]  Patient here for  Chief Complaint  Patient presents with   Medical Management of Chronic Issues    HPI Here to follow up regarding hypercholesterolemia, diabetes and hypertension.  She reports she is doing relatively well.  Tries to stay active. No chest pain or sob reported.  No abdominal pain or bowel change reported.  Tolerating ozempic.     Past Medical History:  Diagnosis Date   Asymptomatic cholelithiasis    Diabetes mellitus without complication (HCC)    Hypertension    Mammographic microcalcification 11/16/2013   Thrombocytopenia (HCC)    Recent platelet count 151 on 11/01/08   Tobacco use    Past Surgical History:  Procedure Laterality Date   BREAST BIOPSY Left 2012   NEG   COLONOSCOPY N/A 04/15/2023   Procedure: COLONOSCOPY;  Surgeon: Jaynie Collins, DO;  Location: [facility] ENDOSCOPY;  Service: Gastroenterology;  Laterality: N/A;   KNEE SURGERY Left 3/11   left knee    Family History  Problem Relation Age of Onset   Hypertension Mother    Diabetes Mother    Hypothyroidism Mother    Heart disease Father        Died of a Heart Attack    Breast cancer Neg Hx    Social History   Socioeconomic History   Marital status: Single    Spouse name: Not on file   Number of children: Not on file   Years of education: Not on file   Highest education level: Not on file  Occupational History   Not on file  Tobacco Use   Smoking status: Former    Current packs/day: 0.25    Average packs/day: 0.3 packs/day for 12.0 years (3.0 ttl pk-yrs)    Types: Cigarettes   Smokeless tobacco: Never   Tobacco comments:    Quit March 2016  Vaping Use   Vaping status: Never Used  Substance and Sexual Activity   Alcohol use: Yes    Alcohol/week: 0.0 standard drinks of alcohol    Comment: occasionally   Drug use: No   Sexual activity: Not on file  Other Topics Concern   Not  on file  Social History Narrative   Not on file   Social Determinants of Health   Financial Resource Strain: Low Risk  (03/24/2021)   Overall Financial Resource Strain (CARDIA)    Difficulty of Paying Living Expenses: Not very hard  Food Insecurity: Not on file  Transportation Needs: Not on file  Physical Activity: Not on file  Stress: Not on file  Social Connections: Not on file     Review of Systems  Constitutional:  Negative for appetite change and unexpected weight change.  HENT:  Negative for congestion and sinus pressure.   Respiratory:  Negative for cough, chest tightness and shortness of breath.   Cardiovascular:  Negative for chest pain and palpitations.  Gastrointestinal:  Negative for abdominal pain, diarrhea, nausea and vomiting.  Genitourinary:  Negative for difficulty urinating and dysuria.  Musculoskeletal:  Negative for joint swelling and myalgias.  Skin:  Negative for color change and rash.  Neurological:  Negative for dizziness and headaches.  Psychiatric/Behavioral:  Negative for agitation and dysphoric mood.        Objective:     BP 110/72   Pulse 67   Temp 97.8 F (36.6 C)   Resp 16   Ht  5\' 9"  (1.753 m)   Wt (!) 367 lb (166.5 kg)   LMP 11/21/2012   SpO2 98%   BMI 54.20 kg/m  Wt Readings from Last 3 Encounters:  08/17/23 (!) 367 lb (166.5 kg)  04/15/23 (!) 366 lb (166 kg)  04/14/23 (!) 370 lb (167.8 kg)    Physical Exam Vitals reviewed.  Constitutional:      General: She is not in acute distress.    Appearance: Normal appearance.  HENT:     Head: Normocephalic and atraumatic.     Right Ear: External ear normal.     Left Ear: External ear normal.  Eyes:     General: No scleral icterus.       Right eye: No discharge.        Left eye: No discharge.     Conjunctiva/sclera: Conjunctivae normal.  Neck:     Thyroid: No thyromegaly.  Cardiovascular:     Rate and Rhythm: Normal rate and regular rhythm.  Pulmonary:     Effort: No  respiratory distress.     Breath sounds: Normal breath sounds. No wheezing.  Abdominal:     General: Bowel sounds are normal.     Palpations: Abdomen is soft.     Tenderness: There is no abdominal tenderness.  Musculoskeletal:        General: No swelling or tenderness.     Cervical back: Neck supple. No tenderness.  Lymphadenopathy:     Cervical: No cervical adenopathy.  Skin:    Findings: No erythema or rash.  Neurological:     Mental Status: She is alert.  Psychiatric:        Mood and Affect: Mood normal.        Behavior: Behavior normal.      Outpatient Encounter Medications as of 08/17/2023  Medication Sig   acetaminophen (TYLENOL) 500 MG tablet Take 500 mg by mouth every 6 (six) hours as needed.   aspirin EC 81 MG tablet Take 81 mg by mouth daily. Swallow whole.   metFORMIN (GLUCOPHAGE-XR) 500 MG 24 hr tablet TAKE 1 TABLET BY MOUTH EVERY DAY WITH BREAKFAST   potassium chloride (KLOR-CON M10) 10 MEQ tablet Take 1 tablet (10 mEq total) by mouth daily.   rosuvastatin (CRESTOR) 40 MG tablet TAKE 1 TABLET BY MOUTH EVERY DAY   Semaglutide, 2 MG/DOSE, (OZEMPIC, 2 MG/DOSE,) 8 MG/3ML SOPN INJECT 2 MG AS DIRECTED INTO THE SKIN ONCE A WEEK.   triamterene-hydrochlorothiazide (MAXZIDE-25) 37.5-25 MG tablet Take 1 tablet by mouth daily.   [DISCONTINUED] potassium chloride (K-DUR) 10 MEQ tablet TAKE 1 TABLET EVERY DAY   No facility-administered encounter medications on file as of 08/17/2023.     Lab Results  Component Value Date   WBC 5.7 08/18/2023   HGB 12.6 08/18/2023   HCT 39.4 08/18/2023   PLT 149.0 (L) 08/18/2023   GLUCOSE 104 (H) 08/18/2023   CHOL 164 08/18/2023   TRIG 58.0 08/18/2023   HDL 65.00 08/18/2023   LDLDIRECT 140.7 07/13/2013   LDLCALC 87 08/18/2023   ALT 14 08/18/2023   AST 16 08/18/2023   NA 140 08/18/2023   K 3.6 08/18/2023   CL 105 08/18/2023   CREATININE 0.97 08/18/2023   BUN 17 08/18/2023   CO2 26 08/18/2023   TSH 0.88 08/18/2023   HGBA1C 6.3  08/18/2023   MICROALBUR <0.7 04/14/2023    No results found.     Assessment & Plan:  Hypercholesteremia Assessment & Plan: crestor 20mg  q day. Low cholesterol diet and  exercise.  Lab Results  Component Value Date   CHOL 164 08/18/2023   HDL 65.00 08/18/2023   LDLCALC 87 08/18/2023   LDLDIRECT 140.7 07/13/2013   TRIG 58.0 08/18/2023   CHOLHDL 3 08/18/2023    Orders: -     CBC with Differential/Platelet; Future -     Hepatic function panel; Future -     Lipid panel; Future -     TSH; Future  Essential hypertension, benign Assessment & Plan: Blood pressure doing well.  Continues on triam/hctz.  Follow pressures.  Follow metabolic panel.   Orders: -     Basic metabolic panel; Future  Type 2 diabetes mellitus with hyperglycemia, without long-term current use of insulin (HCC) Assessment & Plan: On ozempic.   Low carb diet and exercise.  Follow met b and A1c. Tolerating the medication. Follow.   Orders: -     Hemoglobin A1c; Future  Severe obesity (BMI >= 40) (HCC) Assessment & Plan: Is exercising.  On ozempic. Tolerating.  Is watching her diet.  Low carb diet and exercise.  Follow.    Thrombocytopenia (HCC) Assessment & Plan: Recheck cbc next labs to confirm stable.       Dale [place], MD

## 2023-08-18 ENCOUNTER — Other Ambulatory Visit (INDEPENDENT_AMBULATORY_CARE_PROVIDER_SITE_OTHER): Payer: Managed Care, Other (non HMO)

## 2023-08-18 DIAGNOSIS — E1165 Type 2 diabetes mellitus with hyperglycemia: Secondary | ICD-10-CM

## 2023-08-18 DIAGNOSIS — E7800 Pure hypercholesterolemia, unspecified: Secondary | ICD-10-CM | POA: Diagnosis not present

## 2023-08-18 DIAGNOSIS — I10 Essential (primary) hypertension: Secondary | ICD-10-CM | POA: Diagnosis not present

## 2023-08-18 LAB — CBC WITH DIFFERENTIAL/PLATELET
Basophils Absolute: 0 10*3/uL (ref 0.0–0.1)
Basophils Relative: 0.5 % (ref 0.0–3.0)
Eosinophils Absolute: 0.1 10*3/uL (ref 0.0–0.7)
Eosinophils Relative: 2.5 % (ref 0.0–5.0)
HCT: 39.4 % (ref 36.0–46.0)
Hemoglobin: 12.6 g/dL (ref 12.0–15.0)
Lymphocytes Relative: 41.3 % (ref 12.0–46.0)
Lymphs Abs: 2.4 10*3/uL (ref 0.7–4.0)
MCHC: 31.9 g/dL (ref 30.0–36.0)
MCV: 83.8 fL (ref 78.0–100.0)
Monocytes Absolute: 0.5 10*3/uL (ref 0.1–1.0)
Monocytes Relative: 8.4 % (ref 3.0–12.0)
Neutro Abs: 2.7 10*3/uL (ref 1.4–7.7)
Neutrophils Relative %: 47.3 % (ref 43.0–77.0)
Platelets: 149 10*3/uL — ABNORMAL LOW (ref 150.0–400.0)
RBC: 4.7 Mil/uL (ref 3.87–5.11)
RDW: 15.7 % — ABNORMAL HIGH (ref 11.5–15.5)
WBC: 5.7 10*3/uL (ref 4.0–10.5)

## 2023-08-18 LAB — BASIC METABOLIC PANEL
BUN: 17 mg/dL (ref 6–23)
CO2: 26 meq/L (ref 19–32)
Calcium: 9.3 mg/dL (ref 8.4–10.5)
Chloride: 105 meq/L (ref 96–112)
Creatinine, Ser: 0.97 mg/dL (ref 0.40–1.20)
GFR: 63.23 mL/min (ref >60.00)
Glucose, Bld: 104 mg/dL — ABNORMAL HIGH (ref 70–99)
Potassium: 3.6 meq/L (ref 3.5–5.1)
Sodium: 140 meq/L (ref 135–145)

## 2023-08-18 LAB — HEMOGLOBIN A1C: Hgb A1c MFr Bld: 6.3 % (ref 4.6–6.5)

## 2023-08-18 LAB — HEPATIC FUNCTION PANEL
ALT: 14 U/L (ref 0–35)
AST: 16 U/L (ref 0–37)
Albumin: 4 g/dL (ref 3.5–5.2)
Alkaline Phosphatase: 78 U/L (ref 39–117)
Bilirubin, Direct: 0 mg/dL (ref 0.0–0.3)
Total Bilirubin: 0.3 mg/dL (ref 0.2–1.2)
Total Protein: 7.3 g/dL (ref 6.0–8.3)

## 2023-08-18 LAB — LIPID PANEL
Cholesterol: 164 mg/dL (ref 0–200)
HDL: 65 mg/dL (ref >39.00)
LDL Cholesterol: 87 mg/dL (ref 0–99)
NonHDL: 98.67
Total CHOL/HDL Ratio: 3
Triglycerides: 58 mg/dL (ref 0.0–149.0)
VLDL: 11.6 mg/dL (ref 0.0–40.0)

## 2023-08-18 LAB — TSH: TSH: 0.88 u[IU]/mL (ref 0.35–5.50)

## 2023-08-21 ENCOUNTER — Other Ambulatory Visit: Payer: Self-pay | Admitting: Internal Medicine

## 2023-08-21 ENCOUNTER — Encounter: Payer: Self-pay | Admitting: Internal Medicine

## 2023-08-21 NOTE — Assessment & Plan Note (Signed)
Is exercising.  On ozempic. Tolerating.  Is watching her diet.  Low carb diet and exercise.  Follow.

## 2023-08-21 NOTE — Assessment & Plan Note (Signed)
crestor 20mg  q day. Low cholesterol diet and exercise.  Lab Results  Component Value Date   CHOL 164 08/18/2023   HDL 65.00 08/18/2023   LDLCALC 87 08/18/2023   LDLDIRECT 140.7 07/13/2013   TRIG 58.0 08/18/2023   CHOLHDL 3 08/18/2023

## 2023-08-21 NOTE — Assessment & Plan Note (Signed)
Blood pressure doing well.  Continues on triam/hctz.  Follow pressures.  Follow metabolic panel.  

## 2023-08-21 NOTE — Assessment & Plan Note (Signed)
On ozempic.   Low carb diet and exercise.  Follow met b and A1c. Tolerating the medication. Follow.

## 2023-08-21 NOTE — Assessment & Plan Note (Signed)
Recheck cbc next labs to confirm stable.  

## 2023-08-26 ENCOUNTER — Other Ambulatory Visit: Payer: Self-pay | Admitting: Internal Medicine

## 2023-11-24 ENCOUNTER — Ambulatory Visit: Payer: Managed Care, Other (non HMO) | Admitting: Internal Medicine

## 2023-11-24 ENCOUNTER — Telehealth: Payer: Self-pay | Admitting: Internal Medicine

## 2023-11-24 ENCOUNTER — Encounter: Payer: Self-pay | Admitting: Internal Medicine

## 2023-11-24 VITALS — BP 128/72 | HR 76 | Temp 98.0°F | Resp 16 | Ht 69.5 in | Wt 371.0 lb

## 2023-11-24 DIAGNOSIS — Z6841 Body Mass Index (BMI) 40.0 and over, adult: Secondary | ICD-10-CM

## 2023-11-24 DIAGNOSIS — D696 Thrombocytopenia, unspecified: Secondary | ICD-10-CM | POA: Diagnosis not present

## 2023-11-24 DIAGNOSIS — Z136 Encounter for screening for cardiovascular disorders: Secondary | ICD-10-CM

## 2023-11-24 DIAGNOSIS — E7800 Pure hypercholesterolemia, unspecified: Secondary | ICD-10-CM | POA: Diagnosis not present

## 2023-11-24 DIAGNOSIS — E1165 Type 2 diabetes mellitus with hyperglycemia: Secondary | ICD-10-CM | POA: Diagnosis not present

## 2023-11-24 DIAGNOSIS — Z1231 Encounter for screening mammogram for malignant neoplasm of breast: Secondary | ICD-10-CM

## 2023-11-24 DIAGNOSIS — Z7985 Long-term (current) use of injectable non-insulin antidiabetic drugs: Secondary | ICD-10-CM

## 2023-11-24 DIAGNOSIS — I10 Essential (primary) hypertension: Secondary | ICD-10-CM

## 2023-11-24 LAB — BASIC METABOLIC PANEL
BUN: 19 mg/dL (ref 6–23)
CO2: 27 meq/L (ref 19–32)
Calcium: 9.2 mg/dL (ref 8.4–10.5)
Chloride: 105 meq/L (ref 96–112)
Creatinine, Ser: 0.98 mg/dL (ref 0.40–1.20)
GFR: 62.34 mL/min (ref >60.00)
Glucose, Bld: 97 mg/dL (ref 70–99)
Potassium: 4.2 meq/L (ref 3.5–5.1)
Sodium: 139 meq/L (ref 135–145)

## 2023-11-24 LAB — HM DIABETES FOOT EXAM

## 2023-11-24 LAB — LIPID PANEL
Cholesterol: 182 mg/dL (ref 0–200)
HDL: 63.3 mg/dL (ref >39.00)
LDL Cholesterol: 99 mg/dL (ref 0–99)
NonHDL: 118.67
Total CHOL/HDL Ratio: 3
Triglycerides: 96 mg/dL (ref 0.0–149.0)
VLDL: 19.2 mg/dL (ref 0.0–40.0)

## 2023-11-24 LAB — CBC WITH DIFFERENTIAL/PLATELET
Basophils Absolute: 0 10*3/uL (ref 0.0–0.1)
Basophils Relative: 0.7 % (ref 0.0–3.0)
Eosinophils Absolute: 0.1 10*3/uL (ref 0.0–0.7)
Eosinophils Relative: 2.9 % (ref 0.0–5.0)
HCT: 40.2 % (ref 36.0–46.0)
Hemoglobin: 13.1 g/dL (ref 12.0–15.0)
Lymphocytes Relative: 38.6 % (ref 12.0–46.0)
Lymphs Abs: 1.8 10*3/uL (ref 0.7–4.0)
MCHC: 32.5 g/dL (ref 30.0–36.0)
MCV: 85 fL (ref 78.0–100.0)
Monocytes Absolute: 0.4 10*3/uL (ref 0.1–1.0)
Monocytes Relative: 8 % (ref 3.0–12.0)
Neutro Abs: 2.3 10*3/uL (ref 1.4–7.7)
Neutrophils Relative %: 49.8 % (ref 43.0–77.0)
Platelets: 130 10*3/uL — ABNORMAL LOW (ref 150.0–400.0)
RBC: 4.73 Mil/uL (ref 3.87–5.11)
RDW: 16.4 % — ABNORMAL HIGH (ref 11.5–15.5)
WBC: 4.7 10*3/uL (ref 4.0–10.5)

## 2023-11-24 LAB — HEPATIC FUNCTION PANEL
ALT: 17 U/L (ref 0–35)
AST: 17 U/L (ref 0–37)
Albumin: 4.3 g/dL (ref 3.5–5.2)
Alkaline Phosphatase: 83 U/L (ref 39–117)
Bilirubin, Direct: 0.1 mg/dL (ref 0.0–0.3)
Total Bilirubin: 0.4 mg/dL (ref 0.2–1.2)
Total Protein: 7.5 g/dL (ref 6.0–8.3)

## 2023-11-24 LAB — HEMOGLOBIN A1C: Hgb A1c MFr Bld: 6.4 % (ref 4.6–6.5)

## 2023-11-24 MED ORDER — METFORMIN HCL ER 500 MG PO TB24: 500.0000 mg | ORAL_TABLET | Freq: Every day | ORAL | 1 refills | Status: DC

## 2023-11-24 MED ORDER — TRIAMTERENE-HCTZ 37.5-25 MG PO TABS: 1.0000 | ORAL_TABLET | Freq: Every day | ORAL | 1 refills | Status: DC

## 2023-11-24 MED ORDER — OZEMPIC (2 MG/DOSE) 8 MG/3ML ~~LOC~~ SOPN: PEN_INJECTOR | SUBCUTANEOUS | 3 refills | Status: DC

## 2023-11-24 MED ORDER — POTASSIUM CHLORIDE CRYS ER 10 MEQ PO TBCR: 10.0000 meq | EXTENDED_RELEASE_TABLET | Freq: Every day | ORAL | 1 refills | Status: DC

## 2023-11-24 NOTE — Assessment & Plan Note (Signed)
crestor 20mg  q day. Low cholesterol diet and exercise.  Lab Results  Component Value Date   CHOL 164 08/18/2023   HDL 65.00 08/18/2023   LDLCALC 87 08/18/2023   LDLDIRECT 140.7 07/13/2013   TRIG 58.0 08/18/2023   CHOLHDL 3 08/18/2023

## 2023-11-24 NOTE — Telephone Encounter (Signed)
 Lft pt vm to call ofc to sch CT. thanks

## 2023-11-24 NOTE — Assessment & Plan Note (Signed)
Is exercising.  On ozempic. Tolerating.  Is watching her diet.  Low carb diet and exercise.  Follow.

## 2023-11-24 NOTE — Assessment & Plan Note (Signed)
 On ozempic.   Low carb diet and exercise.  Follow met b and A1c. Tolerating the medication. Follow. Scheduled for eye exam this month.

## 2023-11-24 NOTE — Assessment & Plan Note (Signed)
 Discussed risk factors and family history.  Agreeable for CT screening - calcium score.

## 2023-11-24 NOTE — Assessment & Plan Note (Signed)
Recheck cbc today to confirm stable.  

## 2023-11-24 NOTE — Progress Notes (Signed)
 Subjective:    Patient ID: Morgan Duncan, female    DOB: 1961/01/12, 63 y.o.   MRN: [id number]  Patient here for  Chief Complaint  Patient presents with   Medical Management of Chronic Issues    HPI Here for a scheduled follow up - follow up regarding hypercholesterolemia, diabetes and hypertension. She is doing well.  Feels good.  Exercising regularly. Riding her bike. No chest pain or sob with increased activity or exertion. No abdominal pain or significant bowel change. Feels she is tolerating ozempic .  May notice a little loose stool - on occasion, but nothing on a regular basis. Overall feels good.    Past Medical History:  Diagnosis Date   Asymptomatic cholelithiasis    Diabetes mellitus without complication (HCC)    Hypertension    Mammographic microcalcification 11/16/2013   Thrombocytopenia (HCC)    Recent platelet count 151 on 11/01/08   Tobacco use    Past Surgical History:  Procedure Laterality Date   BREAST BIOPSY Left 2012   NEG   COLONOSCOPY N/A 04/15/2023   Procedure: COLONOSCOPY;  Surgeon: Onita Elspeth Sharper, DO;  Location: [facility] ENDOSCOPY;  Service: Gastroenterology;  Laterality: N/A;   KNEE SURGERY Left 3/11   left knee    Family History  Problem Relation Age of Onset   Hypertension Mother    Diabetes Mother    Hypothyroidism Mother    Heart disease Father        Died of a Heart Attack    Breast cancer Neg Hx    Social History   Socioeconomic History   Marital status: Single    Spouse name: Not on file   Number of children: Not on file   Years of education: Not on file   Highest education level: Not on file  Occupational History   Not on file  Tobacco Use   Smoking status: Former    Current packs/day: 0.25    Average packs/day: 0.3 packs/day for 12.0 years (3.0 ttl pk-yrs)    Types: Cigarettes   Smokeless tobacco: Never   Tobacco comments:    Quit March 2016  Vaping Use   Vaping status: Never Used  Substance and Sexual Activity    Alcohol use: Yes    Alcohol/week: 0.0 standard drinks of alcohol    Comment: occasionally   Drug use: No   Sexual activity: Not on file  Other Topics Concern   Not on file  Social History Narrative   Not on file   Social Drivers of Health   Financial Resource Strain: Low Risk  (03/24/2021)   Overall Financial Resource Strain (CARDIA)    Difficulty of Paying Living Expenses: Not very hard  Food Insecurity: Not on file  Transportation Needs: Not on file  Physical Activity: Not on file  Stress: Not on file  Social Connections: Not on file     Review of Systems  Constitutional:  Negative for appetite change and unexpected weight change.  HENT:  Negative for congestion and sinus pressure.   Respiratory:  Negative for cough, chest tightness and shortness of breath.   Cardiovascular:  Negative for chest pain, palpitations and leg swelling.  Gastrointestinal:  Negative for abdominal pain, diarrhea, nausea and vomiting.  Genitourinary:  Negative for difficulty urinating and dysuria.  Musculoskeletal:  Negative for joint swelling and myalgias.  Skin:  Negative for color change and rash.  Neurological:  Negative for dizziness and headaches.  Psychiatric/Behavioral:  Negative for agitation and dysphoric mood.  Objective:     BP 128/72   Pulse 76   Temp 98 F (36.7 C)   Resp 16   Ht 5' 9.5 (1.765 m)   Wt (!) 371 lb (168.3 kg)   LMP 11/21/2012   SpO2 97%   BMI 54.00 kg/m  Wt Readings from Last 3 Encounters:  11/24/23 (!) 371 lb (168.3 kg)  08/17/23 (!) 367 lb (166.5 kg)  04/15/23 (!) 366 lb (166 kg)    Physical Exam Vitals reviewed.  Constitutional:      General: She is not in acute distress.    Appearance: Normal appearance.  HENT:     Head: Normocephalic and atraumatic.     Right Ear: External ear normal.     Left Ear: External ear normal.     Mouth/Throat:     Pharynx: No oropharyngeal exudate or posterior oropharyngeal erythema.  Eyes:     General: No  scleral icterus.       Right eye: No discharge.        Left eye: No discharge.     Conjunctiva/sclera: Conjunctivae normal.  Neck:     Thyroid : No thyromegaly.  Cardiovascular:     Rate and Rhythm: Normal rate and regular rhythm.  Pulmonary:     Effort: No respiratory distress.     Breath sounds: Normal breath sounds. No wheezing.  Abdominal:     General: Bowel sounds are normal.     Palpations: Abdomen is soft.     Tenderness: There is no abdominal tenderness.  Musculoskeletal:        General: No swelling or tenderness.     Cervical back: Neck supple. No tenderness.  Lymphadenopathy:     Cervical: No cervical adenopathy.  Skin:    Findings: No erythema or rash.  Neurological:     Mental Status: She is alert.  Psychiatric:        Mood and Affect: Mood normal.        Behavior: Behavior normal.      Outpatient Encounter Medications as of 11/24/2023  Medication Sig   acetaminophen (TYLENOL) 500 MG tablet Take 500 mg by mouth every 6 (six) hours as needed.   aspirin EC 81 MG tablet Take 81 mg by mouth daily. Swallow whole.   metFORMIN  (GLUCOPHAGE -XR) 500 MG 24 hr tablet Take 1 tablet (500 mg total) by mouth daily with breakfast.   potassium chloride  (KLOR-CON  M10) 10 MEQ tablet Take 1 tablet (10 mEq total) by mouth daily.   rosuvastatin  (CRESTOR ) 40 MG tablet TAKE 1 TABLET BY MOUTH EVERY DAY   Semaglutide , 2 MG/DOSE, (OZEMPIC , 2 MG/DOSE,) 8 MG/3ML SOPN INJECT 2 MG AS DIRECTED INTO THE SKIN ONCE A WEEK.   triamterene -hydrochlorothiazide (MAXZIDE-25) 37.5-25 MG tablet Take 1 tablet by mouth daily.   [DISCONTINUED] metFORMIN  (GLUCOPHAGE -XR) 500 MG 24 hr tablet TAKE 1 TABLET BY MOUTH EVERY DAY WITH BREAKFAST   [DISCONTINUED] potassium chloride  (K-DUR) 10 MEQ tablet TAKE 1 TABLET EVERY DAY   [DISCONTINUED] potassium chloride  (KLOR-CON  M10) 10 MEQ tablet Take 1 tablet (10 mEq total) by mouth daily.   [DISCONTINUED] Semaglutide , 2 MG/DOSE, (OZEMPIC , 2 MG/DOSE,) 8 MG/3ML SOPN INJECT 2 MG  AS DIRECTED INTO THE SKIN ONCE A WEEK.   [DISCONTINUED] triamterene -hydrochlorothiazide (MAXZIDE-25) 37.5-25 MG tablet Take 1 tablet by mouth daily.   No facility-administered encounter medications on file as of 11/24/2023.     Lab Results  Component Value Date   WBC 5.7 08/18/2023   HGB 12.6 08/18/2023   HCT 39.4  08/18/2023   PLT 149.0 (L) 08/18/2023   GLUCOSE 104 (H) 08/18/2023   CHOL 164 08/18/2023   TRIG 58.0 08/18/2023   HDL 65.00 08/18/2023   LDLDIRECT 140.7 07/13/2013   LDLCALC 87 08/18/2023   ALT 14 08/18/2023   AST 16 08/18/2023   NA 140 08/18/2023   K 3.6 08/18/2023   CL 105 08/18/2023   CREATININE 0.97 08/18/2023   BUN 17 08/18/2023   CO2 26 08/18/2023   TSH 0.88 08/18/2023   HGBA1C 6.3 08/18/2023   MICROALBUR <0.7 04/14/2023      Assessment & Plan:  Type 2 diabetes mellitus with hyperglycemia, without long-term current use of insulin (HCC) Assessment & Plan: On ozempic .   Low carb diet and exercise.  Follow met b and A1c. Tolerating the medication. Follow. Scheduled for eye exam this month.   Orders: -     Basic metabolic panel -     Hemoglobin A1c  Thrombocytopenia (HCC) Assessment & Plan: Recheck cbc today to confirm stable.   Orders: -     CBC with Differential/Platelet  Hypercholesteremia Assessment & Plan: crestor  20mg  q day. Low cholesterol diet and exercise.  Lab Results  Component Value Date   CHOL 164 08/18/2023   HDL 65.00 08/18/2023   LDLCALC 87 08/18/2023   LDLDIRECT 140.7 07/13/2013   TRIG 58.0 08/18/2023   CHOLHDL 3 08/18/2023    Orders: -     Hepatic function panel -     Lipid panel  Essential hypertension, benign Assessment & Plan: Blood pressure doing well.  Continues on triam/hctz.  Follow pressures.  Follow metabolic panel.   Orders: -     Potassium Chloride  Crys ER; Take 1 tablet (10 mEq total) by mouth daily.  Dispense: 90 tablet; Refill: 1  Visit for screening mammogram -     3D Screening Mammogram, Left and  Right; Future  Encounter for screening for coronary artery disease Assessment & Plan: Discussed risk factors and family history.  Agreeable for CT screening - calcium  score.   Orders: -     CT CARDIAC SCORING (SELF PAY ONLY); Future  Severe obesity (BMI >= 40) (HCC) Assessment & Plan: Is exercising.  On ozempic . Tolerating.  Is watching her diet.  Low carb diet and exercise.  Follow.    Other orders -     metFORMIN  HCl ER; Take 1 tablet (500 mg total) by mouth daily with breakfast.  Dispense: 90 tablet; Refill: 1 -     Ozempic  (2 MG/DOSE); INJECT 2 MG AS DIRECTED INTO THE SKIN ONCE A WEEK.  Dispense: 3 mL; Refill: 3 -     Triamterene -HCTZ; Take 1 tablet by mouth daily.  Dispense: 90 tablet; Refill: 1     Allena Hamilton, MD

## 2023-11-24 NOTE — Assessment & Plan Note (Signed)
Blood pressure doing well.  Continues on triam/hctz.  Follow pressures.  Follow metabolic panel.  

## 2023-11-25 NOTE — Telephone Encounter (Signed)
 Patient already has a ct scan schedule

## 2023-11-26 NOTE — Telephone Encounter (Signed)
 noted

## 2023-12-06 ENCOUNTER — Telehealth: Payer: Self-pay

## 2023-12-06 NOTE — Telephone Encounter (Signed)
Noted  

## 2023-12-06 NOTE — Telephone Encounter (Signed)
Copied from CRM [id number]. Topic: Clinical - Lab/Test Results >> Dec 06, 2023  9:02 AM Morgan Duncan wrote: Reason for CRM: Pt returning missed call from [place], checked lab results notes it stated okay to relay to pt, read note verbatim, pt confirmed they are taking crestor 40 mg daily and not skipping any doses, pt declined to speak with CMA bc she stated she already seen her results in her mychart.   Pt callback [phone]

## 2023-12-09 ENCOUNTER — Ambulatory Visit
Admission: RE | Admit: 2023-12-09 | Discharge: 2023-12-09 | Disposition: A | Discharge status: Home / Self Care | Payer: Self-pay | Source: Ambulatory Visit | Attending: Internal Medicine | Admitting: Internal Medicine

## 2023-12-09 DIAGNOSIS — Z136 Encounter for screening for cardiovascular disorders: Secondary | ICD-10-CM | POA: Insufficient documentation

## 2024-02-10 ENCOUNTER — Ambulatory Visit
Admission: RE | Admit: 2024-02-10 | Discharge: 2024-02-10 | Disposition: A | Discharge status: Home / Self Care | Payer: Managed Care, Other (non HMO) | Source: Ambulatory Visit | Attending: Internal Medicine | Admitting: Internal Medicine

## 2024-02-10 DIAGNOSIS — Z1231 Encounter for screening mammogram for malignant neoplasm of breast: Secondary | ICD-10-CM | POA: Diagnosis present

## 2024-02-29 ENCOUNTER — Telehealth: Payer: Self-pay

## 2024-02-29 ENCOUNTER — Other Ambulatory Visit (HOSPITAL_COMMUNITY): Payer: Self-pay

## 2024-02-29 NOTE — Telephone Encounter (Signed)
 Pharmacy Patient Advocate Encounter   Received notification from Onbase that prior authorization for Ozempic (1 MG/DOSE) 4MG /3ML pen-injectors is required/requested.   Insurance verification completed.   The patient is insured through [employer] .   Per test claim: PA required; PA submitted to above mentioned insurance via CoverMyMeds Key/confirmation #/EOC [id number] Status is pending

## 2024-03-06 ENCOUNTER — Other Ambulatory Visit (HOSPITAL_COMMUNITY): Payer: Self-pay

## 2024-03-07 ENCOUNTER — Other Ambulatory Visit (HOSPITAL_COMMUNITY): Payer: Self-pay

## 2024-03-07 NOTE — Telephone Encounter (Signed)
 Pharmacy Patient Advocate Encounter  Received notification from [employer] that Prior Authorization for Ozempic  (1 MG/DOSE) 4MG /3ML pen-injectors has been APPROVED from 03/04/24 to 03/04/25   PA #/Case ID/Reference #: [id number]

## 2024-03-24 ENCOUNTER — Encounter: Payer: Self-pay | Admitting: Internal Medicine

## 2024-03-24 ENCOUNTER — Ambulatory Visit (INDEPENDENT_AMBULATORY_CARE_PROVIDER_SITE_OTHER): Payer: Managed Care, Other (non HMO) | Admitting: Internal Medicine

## 2024-03-24 VITALS — BP 130/72 | HR 73 | Ht 69.5 in | Wt 368.0 lb

## 2024-03-24 DIAGNOSIS — D696 Thrombocytopenia, unspecified: Secondary | ICD-10-CM | POA: Diagnosis not present

## 2024-03-24 DIAGNOSIS — I10 Essential (primary) hypertension: Secondary | ICD-10-CM | POA: Diagnosis not present

## 2024-03-24 DIAGNOSIS — Z860100 Personal history of colon polyps, unspecified: Secondary | ICD-10-CM

## 2024-03-24 DIAGNOSIS — E1165 Type 2 diabetes mellitus with hyperglycemia: Secondary | ICD-10-CM | POA: Diagnosis not present

## 2024-03-24 DIAGNOSIS — L989 Disorder of the skin and subcutaneous tissue, unspecified: Secondary | ICD-10-CM

## 2024-03-24 DIAGNOSIS — E7800 Pure hypercholesterolemia, unspecified: Secondary | ICD-10-CM | POA: Diagnosis not present

## 2024-03-24 DIAGNOSIS — Z7984 Long term (current) use of oral hypoglycemic drugs: Secondary | ICD-10-CM

## 2024-03-24 MED ORDER — POTASSIUM CHLORIDE CRYS ER 10 MEQ PO TBCR: 10.0000 meq | EXTENDED_RELEASE_TABLET | Freq: Every day | ORAL | 1 refills | Status: DC

## 2024-03-24 MED ORDER — OZEMPIC (2 MG/DOSE) 8 MG/3ML ~~LOC~~ SOPN: PEN_INJECTOR | SUBCUTANEOUS | 3 refills | Status: DC

## 2024-03-24 MED ORDER — ROSUVASTATIN CALCIUM 40 MG PO TABS: 40.0000 mg | ORAL_TABLET | Freq: Every day | ORAL | 3 refills | Status: DC

## 2024-03-24 MED ORDER — METFORMIN HCL ER 500 MG PO TB24: 500.0000 mg | ORAL_TABLET | Freq: Every day | ORAL | 1 refills | Status: DC

## 2024-03-24 MED ORDER — TRIAMTERENE-HCTZ 37.5-25 MG PO TABS: 1.0000 | ORAL_TABLET | Freq: Every day | ORAL | 1 refills | Status: DC

## 2024-03-24 NOTE — Progress Notes (Unsigned)
 Subjective:    Patient ID: Morgan Duncan, female    DOB: 04-Jun-1961, 63 y.o.   MRN: [id number]  Patient here for  Chief Complaint  Patient presents with   Medical Management of Chronic Issues    4 month follow up     HPI Here for a scheduled follow up -  follow up regarding hypercholesterolemia, diabetes and hypertension. Tolerating ozempic .  Recent calcium  score 0. Discussed. Overall she feels she is doing relatively well. No chest pain or sob reported. No abdominal pain or bowel change reported.    Past Medical History:  Diagnosis Date   Asymptomatic cholelithiasis    Diabetes mellitus without complication (HCC)    Hypertension    Mammographic microcalcification 11/16/2013   Thrombocytopenia (HCC)    Recent platelet count 151 on 11/01/08   Tobacco use    Past Surgical History:  Procedure Laterality Date   BREAST BIOPSY Left 2012   NEG   COLONOSCOPY N/A 04/15/2023   Procedure: COLONOSCOPY;  Surgeon: Quintin Buckle, DO;  Location: [facility] ENDOSCOPY;  Service: Gastroenterology;  Laterality: N/A;   KNEE SURGERY Left 3/11   left knee    Family History  Problem Relation Age of Onset   Hypertension Mother    Diabetes Mother    Hypothyroidism Mother    Heart disease Father        Died of a Heart Attack    Breast cancer Neg Hx    Social History   Socioeconomic History   Marital status: Single    Spouse name: Not on file   Number of children: Not on file   Years of education: Not on file   Highest education level: Not on file  Occupational History   Not on file  Tobacco Use   Smoking status: Former    Current packs/day: 0.25    Average packs/day: 0.3 packs/day for 12.0 years (3.0 ttl pk-yrs)    Types: Cigarettes   Smokeless tobacco: Never   Tobacco comments:    Quit March 2016  Vaping Use   Vaping status: Never Used  Substance and Sexual Activity   Alcohol use: Yes    Alcohol/week: 0.0 standard drinks of alcohol    Comment: occasionally   Drug use: No    Sexual activity: Not on file  Other Topics Concern   Not on file  Social History Narrative   Not on file   Social Drivers of Health   Financial Resource Strain: Low Risk  (03/24/2021)   Overall Financial Resource Strain (CARDIA)    Difficulty of Paying Living Expenses: Not very hard  Food Insecurity: Not on file  Transportation Needs: Not on file  Physical Activity: Not on file  Stress: Not on file  Social Connections: Not on file     Review of Systems  Constitutional:  Negative for appetite change and unexpected weight change.  HENT:  Negative for congestion and sinus pressure.   Respiratory:  Negative for cough, chest tightness and shortness of breath.   Cardiovascular:  Negative for chest pain, palpitations and leg swelling.  Gastrointestinal:  Negative for abdominal pain, diarrhea, nausea and vomiting.  Genitourinary:  Negative for difficulty urinating and dysuria.  Musculoskeletal:  Negative for joint swelling and myalgias.  Skin:  Negative for color change and rash.  Neurological:  Negative for dizziness and headaches.  Psychiatric/Behavioral:  Negative for agitation and dysphoric mood.        Objective:     BP 130/72   Pulse 73  Ht 5' 9.5" (1.765 m)   Wt (!) 368 lb (166.9 kg)   LMP 11/21/2012   SpO2 96%   BMI 53.56 kg/m  Wt Readings from Last 3 Encounters:  03/24/24 (!) 368 lb (166.9 kg)  11/24/23 (!) 371 lb (168.3 kg)  08/17/23 (!) 367 lb (166.5 kg)    Physical Exam Vitals reviewed.  Constitutional:      General: She is not in acute distress.    Appearance: Normal appearance.  HENT:     Head: Normocephalic and atraumatic.     Right Ear: External ear normal.     Left Ear: External ear normal.     Mouth/Throat:     Pharynx: No oropharyngeal exudate or posterior oropharyngeal erythema.  Eyes:     General: No scleral icterus.       Right eye: No discharge.        Left eye: No discharge.     Conjunctiva/sclera: Conjunctivae normal.  Neck:      Thyroid : No thyromegaly.  Cardiovascular:     Rate and Rhythm: Normal rate and regular rhythm.  Pulmonary:     Effort: No respiratory distress.     Breath sounds: Normal breath sounds. No wheezing.  Abdominal:     General: Bowel sounds are normal.     Palpations: Abdomen is soft.     Tenderness: There is no abdominal tenderness.  Musculoskeletal:        General: No swelling or tenderness.     Cervical back: Neck supple. No tenderness.  Lymphadenopathy:     Cervical: No cervical adenopathy.  Skin:    Findings: No erythema or rash.  Neurological:     Mental Status: She is alert.  Psychiatric:        Mood and Affect: Mood normal.        Behavior: Behavior normal.         Outpatient Encounter Medications as of 03/24/2024  Medication Sig   acetaminophen (TYLENOL) 500 MG tablet Take 500 mg by mouth every 6 (six) hours as needed.   aspirin EC 81 MG tablet Take 81 mg by mouth daily. Swallow whole.   metFORMIN  (GLUCOPHAGE -XR) 500 MG 24 hr tablet Take 1 tablet (500 mg total) by mouth daily with breakfast.   potassium chloride  (KLOR-CON  M10) 10 MEQ tablet Take 1 tablet (10 mEq total) by mouth daily.   rosuvastatin  (CRESTOR ) 40 MG tablet Take 1 tablet (40 mg total) by mouth daily.   Semaglutide , 2 MG/DOSE, (OZEMPIC , 2 MG/DOSE,) 8 MG/3ML SOPN INJECT 2 MG AS DIRECTED INTO THE SKIN ONCE A WEEK.   triamterene -hydrochlorothiazide (MAXZIDE-25) 37.5-25 MG tablet Take 1 tablet by mouth daily.   [DISCONTINUED] metFORMIN  (GLUCOPHAGE -XR) 500 MG 24 hr tablet Take 1 tablet (500 mg total) by mouth daily with breakfast.   [DISCONTINUED] potassium chloride  (K-DUR) 10 MEQ tablet TAKE 1 TABLET EVERY DAY   [DISCONTINUED] potassium chloride  (KLOR-CON  M10) 10 MEQ tablet Take 1 tablet (10 mEq total) by mouth daily.   [DISCONTINUED] rosuvastatin  (CRESTOR ) 40 MG tablet TAKE 1 TABLET BY MOUTH EVERY DAY   [DISCONTINUED] Semaglutide , 2 MG/DOSE, (OZEMPIC , 2 MG/DOSE,) 8 MG/3ML SOPN INJECT 2 MG AS DIRECTED INTO THE  SKIN ONCE A WEEK.   [DISCONTINUED] triamterene -hydrochlorothiazide (MAXZIDE-25) 37.5-25 MG tablet Take 1 tablet by mouth daily.   No facility-administered encounter medications on file as of 03/24/2024.     Lab Results  Component Value Date   WBC 4.7 11/24/2023   HGB 13.1 11/24/2023   HCT 40.2 11/24/2023  PLT 130.0 (L) 11/24/2023   GLUCOSE 97 11/24/2023   CHOL 182 11/24/2023   TRIG 96.0 11/24/2023   HDL 63.30 11/24/2023   LDLDIRECT 140.7 07/13/2013   LDLCALC 99 11/24/2023   ALT 17 11/24/2023   AST 17 11/24/2023   NA 139 11/24/2023   K 4.2 11/24/2023   CL 105 11/24/2023   CREATININE 0.98 11/24/2023   BUN 19 11/24/2023   CO2 27 11/24/2023   TSH 0.88 08/18/2023   HGBA1C 6.4 11/24/2023   MICROALBUR <0.7 04/14/2023    MM 3D SCREENING MAMMOGRAM BILATERAL BREAST Result Date: 02/11/2024 CLINICAL DATA:  Screening. EXAM: DIGITAL SCREENING BILATERAL MAMMOGRAM WITH TOMOSYNTHESIS AND CAD TECHNIQUE: Bilateral screening digital craniocaudal and mediolateral oblique mammograms were obtained. Bilateral screening digital breast tomosynthesis was performed. The images were evaluated with computer-aided detection. COMPARISON:  Previous exam(s). ACR Breast Density Category b: There are scattered areas of fibroglandular density. FINDINGS: There are no findings suspicious for malignancy. IMPRESSION: No mammographic evidence of malignancy. A result letter of this screening mammogram will be mailed directly to the patient. RECOMMENDATION: Screening mammogram in one year. (Code:SM-B-01Y) BI-RADS CATEGORY  1: Negative. Electronically Signed   By: Dina  Arceo M.D.   On: 02/11/2024 12:37       Assessment & Plan:  Type 2 diabetes mellitus with hyperglycemia, without long-term current use of insulin (HCC) Assessment & Plan: On ozempic .   Low carb diet and exercise.  Follow met b and A1c. Tolerating the medication. Follow met b and A1c.   Lab Results  Component Value Date   HGBA1C 6.4 11/24/2023      Orders: -     Hemoglobin A1c; Future -     Basic metabolic panel with GFR; Future -     Microalbumin / creatinine urine ratio; Future  Hypercholesteremia Assessment & Plan: crestor  20mg  q day. Low cholesterol diet and exercise. Follow lipid panel.  Lab Results  Component Value Date   CHOL 182 11/24/2023   HDL 63.30 11/24/2023   LDLCALC 99 11/24/2023   LDLDIRECT 140.7 07/13/2013   TRIG 96.0 11/24/2023   CHOLHDL 3 11/24/2023    Orders: -     Lipid panel; Future -     Hepatic function panel; Future  Essential hypertension, benign Assessment & Plan: Blood pressure doing well.  Continues on triam/hctz.  Follow pressures.  Follow metabolic panel. No changes today.   Orders: -     Potassium Chloride  Crys ER; Take 1 tablet (10 mEq total) by mouth daily.  Dispense: 90 tablet; Refill: 1  Thrombocytopenia (HCC) Assessment & Plan: Platelet count has been low, but stable. Follow. Recheck cbc with next labs.   Orders: -     CBC with Differential/Platelet; Future  Severe obesity (BMI >= 40) (HCC) Assessment & Plan: Is exercising.  On ozempic . Tolerating.  Is watching her diet.  Continue low carb diet and exercise. Follow.    History of colonic polyps Assessment & Plan: Colonoscopy 04/15/23 - one rectal polyp - hyperplastic and one polyp descending colon - tubular adenoma.    Skin lesion Assessment & Plan: Persistent skin lesion. Discussed dermatology referral.   Orders: -     Ambulatory referral to Dermatology  Other orders -     metFORMIN  HCl ER; Take 1 tablet (500 mg total) by mouth daily with breakfast.  Dispense: 90 tablet; Refill: 1 -     Rosuvastatin  Calcium ; Take 1 tablet (40 mg total) by mouth daily.  Dispense: 90 tablet; Refill: 3 -  Ozempic  (2 MG/DOSE); INJECT 2 MG AS DIRECTED INTO THE SKIN ONCE A WEEK.  Dispense: 3 mL; Refill: 3 -     Triamterene -HCTZ; Take 1 tablet by mouth daily.  Dispense: 90 tablet; Refill: 1     Dellar Fenton, MD

## 2024-03-26 DIAGNOSIS — L989 Disorder of the skin and subcutaneous tissue, unspecified: Secondary | ICD-10-CM | POA: Insufficient documentation

## 2024-03-26 NOTE — Assessment & Plan Note (Signed)
 Blood pressure doing well.  Continues on triam/hctz.  Follow pressures.  Follow metabolic panel. No changes today.

## 2024-03-26 NOTE — Assessment & Plan Note (Signed)
 crestor  20mg  q day. Low cholesterol diet and exercise. Follow lipid panel.  Lab Results  Component Value Date   CHOL 182 11/24/2023   HDL 63.30 11/24/2023   LDLCALC 99 11/24/2023   LDLDIRECT 140.7 07/13/2013   TRIG 96.0 11/24/2023   CHOLHDL 3 11/24/2023

## 2024-03-26 NOTE — Assessment & Plan Note (Signed)
 On ozempic .   Low carb diet and exercise.  Follow met b and A1c. Tolerating the medication. Follow met b and A1c.   Lab Results  Component Value Date   HGBA1C 6.4 11/24/2023

## 2024-03-26 NOTE — Assessment & Plan Note (Signed)
 Persistent skin lesion. Discussed dermatology referral.

## 2024-03-26 NOTE — Assessment & Plan Note (Signed)
 Colonoscopy 04/15/23 - one rectal polyp - hyperplastic and one polyp descending colon - tubular adenoma.

## 2024-03-26 NOTE — Assessment & Plan Note (Signed)
 Platelet count has been low, but stable. Follow. Recheck cbc with next labs.

## 2024-03-26 NOTE — Assessment & Plan Note (Signed)
 Is exercising.  On ozempic . Tolerating.  Is watching her diet.  Continue low carb diet and exercise. Follow.

## 2024-03-28 ENCOUNTER — Other Ambulatory Visit (INDEPENDENT_AMBULATORY_CARE_PROVIDER_SITE_OTHER)

## 2024-03-28 DIAGNOSIS — E1165 Type 2 diabetes mellitus with hyperglycemia: Secondary | ICD-10-CM

## 2024-03-28 DIAGNOSIS — Z7984 Long term (current) use of oral hypoglycemic drugs: Secondary | ICD-10-CM | POA: Diagnosis not present

## 2024-03-28 DIAGNOSIS — Z7985 Long-term (current) use of injectable non-insulin antidiabetic drugs: Secondary | ICD-10-CM | POA: Diagnosis not present

## 2024-03-28 DIAGNOSIS — E7800 Pure hypercholesterolemia, unspecified: Secondary | ICD-10-CM

## 2024-03-28 DIAGNOSIS — D696 Thrombocytopenia, unspecified: Secondary | ICD-10-CM

## 2024-03-28 LAB — LIPID PANEL
Cholesterol: 166 mg/dL (ref 0–200)
HDL: 64.9 mg/dL (ref >39.00)
LDL Cholesterol: 86 mg/dL (ref 0–99)
NonHDL: 101.24
Total CHOL/HDL Ratio: 3
Triglycerides: 74 mg/dL (ref 0.0–149.0)
VLDL: 14.8 mg/dL (ref 0.0–40.0)

## 2024-03-28 LAB — CBC WITH DIFFERENTIAL/PLATELET
Basophils Absolute: 0 10*3/uL (ref 0.0–0.1)
Basophils Relative: 0.6 % (ref 0.0–3.0)
Eosinophils Absolute: 0.1 10*3/uL (ref 0.0–0.7)
Eosinophils Relative: 2.6 % (ref 0.0–5.0)
HCT: 39 % (ref 36.0–46.0)
Hemoglobin: 12.7 g/dL (ref 12.0–15.0)
Lymphocytes Relative: 40.3 % (ref 12.0–46.0)
Lymphs Abs: 1.8 10*3/uL (ref 0.7–4.0)
MCHC: 32.7 g/dL (ref 30.0–36.0)
MCV: 85 fl (ref 78.0–100.0)
Monocytes Absolute: 0.4 10*3/uL (ref 0.1–1.0)
Monocytes Relative: 8.1 % (ref 3.0–12.0)
Neutro Abs: 2.2 10*3/uL (ref 1.4–7.7)
Neutrophils Relative %: 48.4 % (ref 43.0–77.0)
Platelets: 127 10*3/uL — ABNORMAL LOW (ref 150.0–400.0)
RBC: 4.59 Mil/uL (ref 3.87–5.11)
RDW: 15.9 % — ABNORMAL HIGH (ref 11.5–15.5)
WBC: 4.5 10*3/uL (ref 4.0–10.5)

## 2024-03-28 LAB — BASIC METABOLIC PANEL WITH GFR
BUN: 15 mg/dL (ref 6–23)
CO2: 30 meq/L (ref 19–32)
Calcium: 9.2 mg/dL (ref 8.4–10.5)
Chloride: 104 meq/L (ref 96–112)
Creatinine, Ser: 1.11 mg/dL (ref 0.40–1.20)
GFR: 53.55 mL/min — ABNORMAL LOW (ref >60.00)
Glucose, Bld: 93 mg/dL (ref 70–99)
Potassium: 4 meq/L (ref 3.5–5.1)
Sodium: 141 meq/L (ref 135–145)

## 2024-03-28 LAB — HEPATIC FUNCTION PANEL
ALT: 20 U/L (ref 0–35)
AST: 16 U/L (ref 0–37)
Albumin: 4.1 g/dL (ref 3.5–5.2)
Alkaline Phosphatase: 74 U/L (ref 39–117)
Bilirubin, Direct: 0.1 mg/dL (ref 0.0–0.3)
Total Bilirubin: 0.3 mg/dL (ref 0.2–1.2)
Total Protein: 7.1 g/dL (ref 6.0–8.3)

## 2024-03-28 LAB — HEMOGLOBIN A1C: Hgb A1c MFr Bld: 6.3 % (ref 4.6–6.5)

## 2024-03-29 ENCOUNTER — Ambulatory Visit: Payer: Self-pay | Admitting: Internal Medicine

## 2024-03-29 ENCOUNTER — Other Ambulatory Visit: Payer: Self-pay

## 2024-03-29 DIAGNOSIS — E1165 Type 2 diabetes mellitus with hyperglycemia: Secondary | ICD-10-CM

## 2024-03-29 NOTE — Addendum Note (Signed)
 Addended by: Victorino Grates D on: 03/29/2024 10:46 AM   Modules accepted: Orders

## 2024-03-30 ENCOUNTER — Other Ambulatory Visit: Payer: Self-pay

## 2024-03-30 DIAGNOSIS — E1165 Type 2 diabetes mellitus with hyperglycemia: Secondary | ICD-10-CM

## 2024-03-31 ENCOUNTER — Telehealth: Payer: Self-pay | Admitting: Internal Medicine

## 2024-03-31 NOTE — Telephone Encounter (Signed)
 LMTCB. SEE RESULT NOTE FOR DOCUMENTATION

## 2024-03-31 NOTE — Telephone Encounter (Signed)
 Copied from CRM [id number]. Topic: General - Call Back - No Documentation >> Mar 31, 2024 12:30 PM Adrionna Y wrote: Reason for CRM: Patient  Calling because she received a call but nothing is documented  Would like a call back on mobile number

## 2024-04-03 ENCOUNTER — Telehealth: Payer: Self-pay | Admitting: Internal Medicine

## 2024-04-03 NOTE — Telephone Encounter (Signed)
 Copied from CRM [phone] [id number]. Topic: General - Other >> Apr 03, 2024  1:39 PM Jenice Mitts wrote: Reason for CRM: patient is calling because she received a call.  Advised information per last crm on 03/31/24

## 2024-04-04 NOTE — Telephone Encounter (Signed)
 Noted

## 2024-04-27 ENCOUNTER — Other Ambulatory Visit

## 2024-04-27 DIAGNOSIS — E1165 Type 2 diabetes mellitus with hyperglycemia: Secondary | ICD-10-CM

## 2024-04-27 LAB — MICROALBUMIN / CREATININE URINE RATIO
Creatinine,U: 145.1 mg/dL
Microalb Creat Ratio: 5.6 mg/g (ref 0.0–30.0)
Microalb, Ur: 0.8 mg/dL (ref 0.0–1.9)

## 2024-04-27 LAB — BASIC METABOLIC PANEL WITH GFR
BUN: 19 mg/dL (ref 6–23)
CO2: 24 meq/L (ref 19–32)
Calcium: 9.1 mg/dL (ref 8.4–10.5)
Chloride: 107 meq/L (ref 96–112)
Creatinine, Ser: 1 mg/dL (ref 0.40–1.20)
GFR: 60.66 mL/min (ref >60.00)
Glucose, Bld: 91 mg/dL (ref 70–99)
Potassium: 3.6 meq/L (ref 3.5–5.1)
Sodium: 141 meq/L (ref 135–145)

## 2024-04-28 ENCOUNTER — Ambulatory Visit: Payer: Self-pay | Admitting: Internal Medicine

## 2024-07-11 ENCOUNTER — Ambulatory Visit: Admitting: Internal Medicine

## 2024-07-11 VITALS — BP 130/70 | HR 76 | Resp 16 | Ht 69.5 in | Wt 366.4 lb

## 2024-07-11 DIAGNOSIS — E7800 Pure hypercholesterolemia, unspecified: Secondary | ICD-10-CM | POA: Diagnosis not present

## 2024-07-11 DIAGNOSIS — Z0000 Encounter for general adult medical examination without abnormal findings: Secondary | ICD-10-CM

## 2024-07-11 DIAGNOSIS — L989 Disorder of the skin and subcutaneous tissue, unspecified: Secondary | ICD-10-CM

## 2024-07-11 DIAGNOSIS — I10 Essential (primary) hypertension: Secondary | ICD-10-CM

## 2024-07-11 DIAGNOSIS — D696 Thrombocytopenia, unspecified: Secondary | ICD-10-CM

## 2024-07-11 DIAGNOSIS — Z860100 Personal history of colon polyps, unspecified: Secondary | ICD-10-CM

## 2024-07-11 DIAGNOSIS — E1165 Type 2 diabetes mellitus with hyperglycemia: Secondary | ICD-10-CM

## 2024-07-11 LAB — BASIC METABOLIC PANEL WITH GFR
BUN: 16 mg/dL (ref 6–23)
CO2: 28 meq/L (ref 19–32)
Calcium: 9.1 mg/dL (ref 8.4–10.5)
Chloride: 103 meq/L (ref 96–112)
Creatinine, Ser: 0.95 mg/dL (ref 0.40–1.20)
GFR: 64.42 mL/min (ref >60.00)
Glucose, Bld: 105 mg/dL — ABNORMAL HIGH (ref 70–99)
Potassium: 4.4 meq/L (ref 3.5–5.1)
Sodium: 142 meq/L (ref 135–145)

## 2024-07-11 LAB — CBC WITH DIFFERENTIAL/PLATELET
Basophils Absolute: 0 K/uL (ref 0.0–0.1)
Basophils Relative: 0.7 % (ref 0.0–3.0)
Eosinophils Absolute: 0.1 K/uL (ref 0.0–0.7)
Eosinophils Relative: 2.1 % (ref 0.0–5.0)
HCT: 38.5 % (ref 36.0–46.0)
Hemoglobin: 12.7 g/dL (ref 12.0–15.0)
Lymphocytes Relative: 37.1 % (ref 12.0–46.0)
Lymphs Abs: 1.6 K/uL (ref 0.7–4.0)
MCHC: 33 g/dL (ref 30.0–36.0)
MCV: 83.5 fl (ref 78.0–100.0)
Monocytes Absolute: 0.4 K/uL (ref 0.1–1.0)
Monocytes Relative: 9.9 % (ref 3.0–12.0)
Neutro Abs: 2.1 K/uL (ref 1.4–7.7)
Neutrophils Relative %: 50.2 % (ref 43.0–77.0)
Platelets: 150 K/uL (ref 150.0–400.0)
RBC: 4.62 Mil/uL (ref 3.87–5.11)
RDW: 16 % — ABNORMAL HIGH (ref 11.5–15.5)
WBC: 4.2 K/uL (ref 4.0–10.5)

## 2024-07-11 LAB — LIPID PANEL
Cholesterol: 176 mg/dL (ref 0–200)
HDL: 62.7 mg/dL (ref >39.00)
LDL Cholesterol: 95 mg/dL (ref 0–99)
NonHDL: 113.16
Total CHOL/HDL Ratio: 3
Triglycerides: 93 mg/dL (ref 0.0–149.0)
VLDL: 18.6 mg/dL (ref 0.0–40.0)

## 2024-07-11 LAB — HEPATIC FUNCTION PANEL
ALT: 16 U/L (ref 0–35)
AST: 17 U/L (ref 0–37)
Albumin: 4.1 g/dL (ref 3.5–5.2)
Alkaline Phosphatase: 76 U/L (ref 39–117)
Bilirubin, Direct: 0.1 mg/dL (ref 0.0–0.3)
Total Bilirubin: 0.4 mg/dL (ref 0.2–1.2)
Total Protein: 7.3 g/dL (ref 6.0–8.3)

## 2024-07-11 LAB — HEMOGLOBIN A1C: Hgb A1c MFr Bld: 6.5 % (ref 4.6–6.5)

## 2024-07-11 LAB — TSH: TSH: 0.66 u[IU]/mL (ref 0.35–5.50)

## 2024-07-11 MED ORDER — ROSUVASTATIN CALCIUM 40 MG PO TABS: 40.0000 mg | ORAL_TABLET | Freq: Every day | ORAL | 3 refills | Status: AC

## 2024-07-11 MED ORDER — POTASSIUM CHLORIDE CRYS ER 10 MEQ PO TBCR: 10.0000 meq | EXTENDED_RELEASE_TABLET | Freq: Every day | ORAL | 1 refills | Status: DC

## 2024-07-11 MED ORDER — METFORMIN HCL ER 500 MG PO TB24: 500.0000 mg | ORAL_TABLET | Freq: Every day | ORAL | 1 refills | Status: DC

## 2024-07-11 MED ORDER — TRIAMTERENE-HCTZ 37.5-25 MG PO TABS: 1.0000 | ORAL_TABLET | Freq: Every day | ORAL | 1 refills | Status: DC

## 2024-07-11 MED ORDER — OZEMPIC (2 MG/DOSE) 8 MG/3ML ~~LOC~~ SOPN: PEN_INJECTOR | SUBCUTANEOUS | 3 refills | Status: AC

## 2024-07-11 NOTE — Assessment & Plan Note (Signed)
 Physical today 07/11/24  PAP 01/07/22 - negative with negative HPV. Colonoscopy 04/15/23 - one rectal polyp - hyperplastic and one polyp descending colon - tubular adenoma.  Mammogram 02/10/24 - birads I.

## 2024-07-11 NOTE — Progress Notes (Signed)
 der  Subjective:    Patient ID: Morgan Duncan, female    DOB: 1961-09-18, 63 y.o.   MRN: [id number]  Patient here for  Chief Complaint  Patient presents with   Annual Exam    HPI Here for a physical exam. On ozempic  for her diabetes. Recent calcium  score 0. Feels good. Staying active. Going to the gym. No chest pain or sob reported. No abdominal pain or bowel change reported. Blood pressure doing well.    Past Medical History:  Diagnosis Date   Asymptomatic cholelithiasis    Diabetes mellitus without complication (HCC)    Hypertension    Mammographic microcalcification 11/16/2013   Thrombocytopenia (HCC)    Recent platelet count 151 on 11/01/08   Tobacco use    Past Surgical History:  Procedure Laterality Date   BREAST BIOPSY Left 2012   NEG   COLONOSCOPY N/A 04/15/2023   Procedure: COLONOSCOPY;  Surgeon: Onita Elspeth Sharper, DO;  Location: [facility] ENDOSCOPY;  Service: Gastroenterology;  Laterality: N/A;   KNEE SURGERY Left 3/11   left knee    Family History  Problem Relation Age of Onset   Hypertension Mother    Diabetes Mother    Hypothyroidism Mother    Heart disease Father        Died of a Heart Attack    Breast cancer Neg Hx    Social History   Socioeconomic History   Marital status: Single    Spouse name: Not on file   Number of children: Not on file   Years of education: Not on file   Highest education level: Not on file  Occupational History   Not on file  Tobacco Use   Smoking status: Former    Current packs/day: 0.25    Average packs/day: 0.3 packs/day for 12.0 years (3.0 ttl pk-yrs)    Types: Cigarettes   Smokeless tobacco: Never   Tobacco comments:    Quit March 2016  Vaping Use   Vaping status: Never Used  Substance and Sexual Activity   Alcohol use: Yes    Alcohol/week: 0.0 standard drinks of alcohol    Comment: occasionally   Drug use: No   Sexual activity: Not on file  Other Topics Concern   Not on file  Social History Narrative    Not on file   Social Drivers of Health   Financial Resource Strain: Low Risk  (03/24/2021)   Overall Financial Resource Strain (CARDIA)    Difficulty of Paying Living Expenses: Not very hard  Food Insecurity: Not on file  Transportation Needs: Not on file  Physical Activity: Not on file  Stress: Not on file  Social Connections: Not on file     Review of Systems  Constitutional:  Negative for appetite change and unexpected weight change.  HENT:  Negative for congestion, sinus pressure and sore throat.   Eyes:  Negative for pain and visual disturbance.  Respiratory:  Negative for cough, chest tightness and shortness of breath.   Cardiovascular:  Negative for chest pain, palpitations and leg swelling.  Gastrointestinal:  Negative for abdominal pain, diarrhea, nausea and vomiting.  Genitourinary:  Negative for difficulty urinating and dysuria.  Musculoskeletal:  Negative for joint swelling and myalgias.  Skin:  Negative for color change and rash.  Neurological:  Negative for dizziness and headaches.  Hematological:  Negative for adenopathy. Does not bruise/bleed easily.  Psychiatric/Behavioral:  Negative for agitation and dysphoric mood.        Objective:  BP 130/70   Pulse 76   Resp 16   Ht 5' 9.5 (1.765 m)   Wt (!) 366 lb 6.4 oz (166.2 kg)   LMP 11/21/2012   SpO2 98%   BMI 53.33 kg/m  Wt Readings from Last 3 Encounters:  07/11/24 (!) 366 lb 6.4 oz (166.2 kg)  03/24/24 (!) 368 lb (166.9 kg)  11/24/23 (!) 371 lb (168.3 kg)    Physical Exam Vitals reviewed.  Constitutional:      General: She is not in acute distress.    Appearance: Normal appearance. She is well-developed.  HENT:     Head: Normocephalic and atraumatic.     Right Ear: External ear normal.     Left Ear: External ear normal.     Mouth/Throat:     Pharynx: No oropharyngeal exudate or posterior oropharyngeal erythema.  Eyes:     General: No scleral icterus.       Right eye: No discharge.         Left eye: No discharge.     Conjunctiva/sclera: Conjunctivae normal.  Neck:     Thyroid : No thyromegaly.  Cardiovascular:     Rate and Rhythm: Normal rate and regular rhythm.  Pulmonary:     Effort: No tachypnea, accessory muscle usage or respiratory distress.     Breath sounds: Normal breath sounds. No decreased breath sounds or wheezing.  Chest:  Breasts:    Right: No inverted nipple, mass, nipple discharge or tenderness (no axillary adenopathy).     Left: No inverted nipple, mass, nipple discharge or tenderness (no axilarry adenopathy).  Abdominal:     General: Bowel sounds are normal.     Palpations: Abdomen is soft.     Tenderness: There is no abdominal tenderness.  Musculoskeletal:        General: No swelling or tenderness.     Cervical back: Neck supple.  Lymphadenopathy:     Cervical: No cervical adenopathy.  Skin:    Findings: No erythema or rash.  Neurological:     Mental Status: She is alert and oriented to person, place, and time.  Psychiatric:        Mood and Affect: Mood normal.        Behavior: Behavior normal.         Outpatient Encounter Medications as of 07/11/2024  Medication Sig   acetaminophen (TYLENOL) 500 MG tablet Take 500 mg by mouth every 6 (six) hours as needed.   aspirin EC 81 MG tablet Take 81 mg by mouth daily. Swallow whole.   metFORMIN  (GLUCOPHAGE -XR) 500 MG 24 hr tablet Take 1 tablet (500 mg total) by mouth daily with breakfast.   potassium chloride  (KLOR-CON  M10) 10 MEQ tablet Take 1 tablet (10 mEq total) by mouth daily.   rosuvastatin  (CRESTOR ) 40 MG tablet Take 1 tablet (40 mg total) by mouth daily.   Semaglutide , 2 MG/DOSE, (OZEMPIC , 2 MG/DOSE,) 8 MG/3ML SOPN INJECT 2 MG AS DIRECTED INTO THE SKIN ONCE A WEEK.   triamterene -hydrochlorothiazide (MAXZIDE-25) 37.5-25 MG tablet Take 1 tablet by mouth daily.   [DISCONTINUED] metFORMIN  (GLUCOPHAGE -XR) 500 MG 24 hr tablet Take 1 tablet (500 mg total) by mouth daily with breakfast.    [DISCONTINUED] potassium chloride  (K-DUR) 10 MEQ tablet TAKE 1 TABLET EVERY DAY   [DISCONTINUED] potassium chloride  (KLOR-CON  M10) 10 MEQ tablet Take 1 tablet (10 mEq total) by mouth daily.   [DISCONTINUED] rosuvastatin  (CRESTOR ) 40 MG tablet Take 1 tablet (40 mg total) by mouth daily.   [DISCONTINUED] Semaglutide ,  2 MG/DOSE, (OZEMPIC , 2 MG/DOSE,) 8 MG/3ML SOPN INJECT 2 MG AS DIRECTED INTO THE SKIN ONCE A WEEK.   [DISCONTINUED] triamterene -hydrochlorothiazide (MAXZIDE-25) 37.5-25 MG tablet Take 1 tablet by mouth daily.   No facility-administered encounter medications on file as of 07/11/2024.     Lab Results  Component Value Date   WBC 4.2 07/11/2024   HGB 12.7 07/11/2024   HCT 38.5 07/11/2024   PLT 150.0 07/11/2024   GLUCOSE 105 (H) 07/11/2024   CHOL 176 07/11/2024   TRIG 93.0 07/11/2024   HDL 62.70 07/11/2024   LDLDIRECT 140.7 07/13/2013   LDLCALC 95 07/11/2024   ALT 16 07/11/2024   AST 17 07/11/2024   NA 142 07/11/2024   K 4.4 07/11/2024   CL 103 07/11/2024   CREATININE 0.95 07/11/2024   BUN 16 07/11/2024   CO2 28 07/11/2024   TSH 0.66 07/11/2024   HGBA1C 6.5 07/11/2024   MICROALBUR 0.8 04/27/2024    MM 3D SCREENING MAMMOGRAM BILATERAL BREAST Result Date: 02/11/2024 CLINICAL DATA:  Screening. EXAM: DIGITAL SCREENING BILATERAL MAMMOGRAM WITH TOMOSYNTHESIS AND CAD TECHNIQUE: Bilateral screening digital craniocaudal and mediolateral oblique mammograms were obtained. Bilateral screening digital breast tomosynthesis was performed. The images were evaluated with computer-aided detection. COMPARISON:  Previous exam(s). ACR Breast Density Category b: There are scattered areas of fibroglandular density. FINDINGS: There are no findings suspicious for malignancy. IMPRESSION: No mammographic evidence of malignancy. A result letter of this screening mammogram will be mailed directly to the patient. RECOMMENDATION: Screening mammogram in one year. (Code:SM-B-01Y) BI-RADS CATEGORY  1: Negative.  Electronically Signed   By: Dina  Arceo M.D.   On: 02/11/2024 12:37       Assessment & Plan:  Routine general medical examination at a health care facility  Hypercholesteremia Assessment & Plan: crestor  20mg  q day. Low cholesterol diet and exercise.  Check lipid panel.  Lab Results  Component Value Date   CHOL 176 07/11/2024   HDL 62.70 07/11/2024   LDLCALC 95 07/11/2024   LDLDIRECT 140.7 07/13/2013   TRIG 93.0 07/11/2024   CHOLHDL 3 07/11/2024    Orders: -     Lipid panel -     Hepatic function panel -     TSH  Type 2 diabetes mellitus with hyperglycemia, without long-term current use of insulin (HCC) Assessment & Plan: On ozempic .   Low carb diet and exercise.  Follow met b and A1c. Tolerating the medication.  No changes in medication.  Lab Results  Component Value Date   HGBA1C 6.5 07/11/2024     Orders: -     Hemoglobin A1c -     Basic metabolic panel with GFR  Essential hypertension, benign Assessment & Plan: Blood pressure doing well.  Continues on triam/hctz.  Follow pressures.  Follow metabolic panel.  No changes in medication today.   Orders: -     Potassium Chloride  Crys ER; Take 1 tablet (10 mEq total) by mouth daily.  Dispense: 90 tablet; Refill: 1  Health care maintenance Assessment & Plan: Physical today 07/11/24  PAP 01/07/22 - negative with negative HPV. Colonoscopy 04/15/23 - one rectal polyp - hyperplastic and one polyp descending colon - tubular adenoma.  Mammogram 02/10/24 - birads I.     Thrombocytopenia (HCC) Assessment & Plan: Follow cbc.   Orders: -     CBC with Differential/Platelet  Severe obesity (BMI >= 40) (HCC) Assessment & Plan: Is exercising. Conitnues on ozempic . Has lost weight. Conitnue diet and exercise.    History of colonic polyps  Assessment & Plan: Colonoscopy 04/15/23 - one rectal polyp - hyperplastic and one polyp descending colon - tubular adenoma.    Skin lesion Assessment & Plan: Skin lesion - abdomen. Request  referral to dermatology.   Orders: -     Ambulatory referral to Dermatology  Other orders -     metFORMIN  HCl ER; Take 1 tablet (500 mg total) by mouth daily with breakfast.  Dispense: 90 tablet; Refill: 1 -     Rosuvastatin  Calcium ; Take 1 tablet (40 mg total) by mouth daily.  Dispense: 90 tablet; Refill: 3 -     Ozempic  (2 MG/DOSE); INJECT 2 MG AS DIRECTED INTO THE SKIN ONCE A WEEK.  Dispense: 3 mL; Refill: 3 -     Triamterene -HCTZ; Take 1 tablet by mouth daily.  Dispense: 90 tablet; Refill: 1     Allena Hamilton, MD

## 2024-07-12 ENCOUNTER — Ambulatory Visit: Payer: Self-pay | Admitting: Internal Medicine

## 2024-07-17 ENCOUNTER — Encounter: Payer: Self-pay | Admitting: Internal Medicine

## 2024-07-17 NOTE — Assessment & Plan Note (Signed)
 Blood pressure doing well.  Continues on triam/hctz.  Follow pressures.  Follow metabolic panel.  No changes in medication today.

## 2024-07-17 NOTE — Assessment & Plan Note (Signed)
 Follow cbc.

## 2024-07-17 NOTE — Assessment & Plan Note (Signed)
 Is exercising. Conitnues on ozempic . Has lost weight. Conitnue diet and exercise.

## 2024-07-17 NOTE — Assessment & Plan Note (Signed)
 Colonoscopy 04/15/23 - one rectal polyp - hyperplastic and one polyp descending colon - tubular adenoma.

## 2024-07-17 NOTE — Assessment & Plan Note (Signed)
 On ozempic .   Low carb diet and exercise.  Follow met b and A1c. Tolerating the medication.  No changes in medication.  Lab Results  Component Value Date   HGBA1C 6.5 07/11/2024

## 2024-07-17 NOTE — Assessment & Plan Note (Signed)
 crestor  20mg  q day. Low cholesterol diet and exercise.  Check lipid panel.  Lab Results  Component Value Date   CHOL 176 07/11/2024   HDL 62.70 07/11/2024   LDLCALC 95 07/11/2024   LDLDIRECT 140.7 07/13/2013   TRIG 93.0 07/11/2024   CHOLHDL 3 07/11/2024

## 2024-07-17 NOTE — Assessment & Plan Note (Signed)
 Skin lesion - abdomen. Request referral to dermatology.

## 2024-07-31 LAB — OPHTHALMOLOGY REPORT-SCANNED

## 2024-11-14 ENCOUNTER — Other Ambulatory Visit: Payer: Self-pay

## 2024-11-14 DIAGNOSIS — D696 Thrombocytopenia, unspecified: Secondary | ICD-10-CM

## 2024-11-14 DIAGNOSIS — E1165 Type 2 diabetes mellitus with hyperglycemia: Secondary | ICD-10-CM

## 2024-11-14 DIAGNOSIS — E7800 Pure hypercholesterolemia, unspecified: Secondary | ICD-10-CM

## 2024-11-21 ENCOUNTER — Other Ambulatory Visit

## 2024-11-21 DIAGNOSIS — D696 Thrombocytopenia, unspecified: Secondary | ICD-10-CM

## 2024-11-21 DIAGNOSIS — E7800 Pure hypercholesterolemia, unspecified: Secondary | ICD-10-CM | POA: Diagnosis not present

## 2024-11-21 DIAGNOSIS — E1165 Type 2 diabetes mellitus with hyperglycemia: Secondary | ICD-10-CM | POA: Diagnosis not present

## 2024-11-21 LAB — CBC WITH DIFFERENTIAL/PLATELET
Basophils Absolute: 0 K/uL (ref 0.0–0.1)
Basophils Relative: 0.7 % (ref 0.0–3.0)
Eosinophils Absolute: 0.1 K/uL (ref 0.0–0.7)
Eosinophils Relative: 2.7 % (ref 0.0–5.0)
HCT: 39.7 % (ref 36.0–46.0)
Hemoglobin: 13 g/dL (ref 12.0–15.0)
Lymphocytes Relative: 42.3 % (ref 12.0–46.0)
Lymphs Abs: 2 K/uL (ref 0.7–4.0)
MCHC: 32.8 g/dL (ref 30.0–36.0)
MCV: 84.4 fl (ref 78.0–100.0)
Monocytes Absolute: 0.3 K/uL (ref 0.1–1.0)
Monocytes Relative: 6.5 % (ref 3.0–12.0)
Neutro Abs: 2.2 K/uL (ref 1.4–7.7)
Neutrophils Relative %: 47.8 % (ref 43.0–77.0)
Platelets: 133 K/uL — ABNORMAL LOW (ref 150.0–400.0)
RBC: 4.71 Mil/uL (ref 3.87–5.11)
RDW: 16.6 % — ABNORMAL HIGH (ref 11.5–15.5)
WBC: 4.6 K/uL (ref 4.0–10.5)

## 2024-11-21 LAB — BASIC METABOLIC PANEL WITH GFR
BUN: 15 mg/dL (ref 6–23)
CO2: 30 meq/L (ref 19–32)
Calcium: 9.1 mg/dL (ref 8.4–10.5)
Chloride: 105 meq/L (ref 96–112)
Creatinine, Ser: 0.97 mg/dL (ref 0.40–1.20)
GFR: 62.67 mL/min
Glucose, Bld: 99 mg/dL (ref 70–99)
Potassium: 3.9 meq/L (ref 3.5–5.1)
Sodium: 141 meq/L (ref 135–145)

## 2024-11-21 LAB — HEPATIC FUNCTION PANEL
ALT: 28 U/L (ref 3–35)
AST: 22 U/L (ref 5–37)
Albumin: 4.1 g/dL (ref 3.5–5.2)
Alkaline Phosphatase: 86 U/L (ref 39–117)
Bilirubin, Direct: 0.1 mg/dL (ref 0.1–0.3)
Total Bilirubin: 0.5 mg/dL (ref 0.2–1.2)
Total Protein: 6.9 g/dL (ref 6.0–8.3)

## 2024-11-21 LAB — LIPID PANEL
Cholesterol: 182 mg/dL (ref 28–200)
HDL: 64 mg/dL
LDL Cholesterol: 95 mg/dL (ref 10–99)
NonHDL: 118.3
Total CHOL/HDL Ratio: 3
Triglycerides: 117 mg/dL (ref 10.0–149.0)
VLDL: 23.4 mg/dL (ref 0.0–40.0)

## 2024-11-21 LAB — HEMOGLOBIN A1C: Hgb A1c MFr Bld: 6.1 % (ref 4.6–6.5)

## 2024-11-21 LAB — TSH: TSH: 1.16 u[IU]/mL (ref 0.35–5.50)

## 2024-11-22 ENCOUNTER — Ambulatory Visit: Payer: Self-pay | Admitting: Internal Medicine

## 2024-11-23 ENCOUNTER — Encounter: Payer: Self-pay | Admitting: Internal Medicine

## 2024-11-23 ENCOUNTER — Ambulatory Visit: Admitting: Internal Medicine

## 2024-11-23 VITALS — BP 130/76 | HR 71 | Temp 97.8°F | Ht 69.5 in | Wt 372.0 lb

## 2024-11-23 DIAGNOSIS — I10 Essential (primary) hypertension: Secondary | ICD-10-CM

## 2024-11-23 DIAGNOSIS — Z7985 Long-term (current) use of injectable non-insulin antidiabetic drugs: Secondary | ICD-10-CM | POA: Diagnosis not present

## 2024-11-23 DIAGNOSIS — E7800 Pure hypercholesterolemia, unspecified: Secondary | ICD-10-CM | POA: Diagnosis not present

## 2024-11-23 DIAGNOSIS — R0681 Apnea, not elsewhere classified: Secondary | ICD-10-CM | POA: Insufficient documentation

## 2024-11-23 DIAGNOSIS — D696 Thrombocytopenia, unspecified: Secondary | ICD-10-CM | POA: Diagnosis not present

## 2024-11-23 DIAGNOSIS — Z860100 Personal history of colon polyps, unspecified: Secondary | ICD-10-CM

## 2024-11-23 DIAGNOSIS — E1165 Type 2 diabetes mellitus with hyperglycemia: Secondary | ICD-10-CM

## 2024-11-23 LAB — HM DIABETES FOOT EXAM

## 2024-11-23 MED ORDER — TRIAMTERENE-HCTZ 37.5-25 MG PO TABS: 1.0000 | ORAL_TABLET | Freq: Every day | ORAL | 1 refills | Status: AC

## 2024-11-23 MED ORDER — METFORMIN HCL ER 500 MG PO TB24: 500.0000 mg | ORAL_TABLET | Freq: Every day | ORAL | 1 refills | Status: AC

## 2024-11-23 MED ORDER — POTASSIUM CHLORIDE CRYS ER 10 MEQ PO TBCR: 10.0000 meq | EXTENDED_RELEASE_TABLET | Freq: Every day | ORAL | 1 refills | Status: AC

## 2024-11-23 NOTE — Progress Notes (Unsigned)
 "  Subjective:    Patient ID: Morgan Duncan, female    DOB: February 22, 1961, 64 y.o.   MRN: [id number]  Patient here for  Chief Complaint  Patient presents with   Medical Management of Chronic Issues    HPI Here for a scheduled follow up - follow up regarding diabetes and hypertension. Continues on ozempic . Tolerating. Overall doing relatively well. Handling stress. Has good support. No chest pain or sob noted. No abdominal pain or bowel change reported. Reports increased snoring. Witness apneic episodes. Discussed possible sleep apnea. Agreeable for referral to pulmonary.    Past Medical History:  Diagnosis Date   Asymptomatic cholelithiasis    Diabetes mellitus without complication (HCC)    Hypertension    Mammographic microcalcification 11/16/2013   Thrombocytopenia    Recent platelet count 151 on 11/01/08   Tobacco use    Past Surgical History:  Procedure Laterality Date   BREAST BIOPSY Left 2012   NEG   COLONOSCOPY N/A 04/15/2023   Procedure: COLONOSCOPY;  Surgeon: Onita Elspeth Sharper, DO;  Location: [facility] ENDOSCOPY;  Service: Gastroenterology;  Laterality: N/A;   KNEE SURGERY Left 3/11   left knee    Family History  Problem Relation Age of Onset   Hypertension Mother    Diabetes Mother    Hypothyroidism Mother    Heart disease Father        Died of a Heart Attack    Breast cancer Neg Hx    Social History   Socioeconomic History   Marital status: Single    Spouse name: Not on file   Number of children: Not on file   Years of education: Not on file   Highest education level: Bachelor's degree (e.g., BA, AB, BS)  Occupational History   Not on file  Tobacco Use   Smoking status: Former    Current packs/day: 0.25    Average packs/day: 0.3 packs/day for 12.0 years (3.0 ttl pk-yrs)    Types: Cigarettes   Smokeless tobacco: Never   Tobacco comments:    Quit March 2016  Vaping Use   Vaping status: Never Used  Substance and Sexual Activity   Alcohol use: Yes     Alcohol/week: 0.0 standard drinks of alcohol    Comment: occasionally   Drug use: No   Sexual activity: Not on file  Other Topics Concern   Not on file  Social History Narrative   Not on file   Social Drivers of Health   Tobacco Use: Medium Risk (11/26/2024)   Patient History    Smoking Tobacco Use: Former    Smokeless Tobacco Use: Never    Passive Exposure: Not on file  Financial Resource Strain: Low Risk (11/21/2024)   Overall Financial Resource Strain (CARDIA)    Difficulty of Paying Living Expenses: Not hard at all  Food Insecurity: No Food Insecurity (11/21/2024)   Epic    Worried About Radiation Protection Practitioner of Food in the Last Year: Never true    Ran Out of Food in the Last Year: Never true  Transportation Needs: No Transportation Needs (11/21/2024)   Epic    Lack of Transportation (Medical): No    Lack of Transportation (Non-Medical): No  Physical Activity: Sufficiently Active (11/21/2024)   Exercise Vital Sign    Days of Exercise per Week: 3 days    Minutes of Exercise per Session: 60 min  Stress: No Stress Concern Present (11/21/2024)   [employer] of Occupational Health - Occupational Stress Questionnaire    Feeling  of Stress: Not at all  Social Connections: Moderately Integrated (11/21/2024)   Social Connection and Isolation Panel    Frequency of Communication with Friends and Family: More than three times a week    Frequency of Social Gatherings with Friends and Family: More than three times a week    Attends Religious Services: More than 4 times per year    Active Member of Clubs or Organizations: Yes    Attends Banker Meetings: More than 4 times per year    Marital Status: Never married  Depression (PHQ2-9): Low Risk (11/23/2024)   Depression (PHQ2-9)    PHQ-2 Score: 0  Alcohol Screen: Low Risk (11/21/2024)   Alcohol Screen    Last Alcohol Screening Score (AUDIT): 2  Housing: Unknown (11/21/2024)   Epic    Unable to Pay for Housing in the Last Year: No     Number of Times Moved in the Last Year: Not on file    Homeless in the Last Year: No  Utilities: Not on file  Health Literacy: Not on file     Review of Systems  Constitutional:  Negative for appetite change and unexpected weight change.  HENT:  Negative for congestion and sinus pressure.   Respiratory:  Negative for cough, chest tightness and shortness of breath.   Cardiovascular:  Negative for chest pain, palpitations and leg swelling.  Gastrointestinal:  Negative for abdominal pain, diarrhea, nausea and vomiting.  Genitourinary:  Negative for difficulty urinating and dysuria.  Musculoskeletal:  Negative for joint swelling and myalgias.  Skin:  Negative for color change and rash.  Neurological:  Negative for dizziness and headaches.  Psychiatric/Behavioral:  Negative for agitation and dysphoric mood.        Objective:     BP 130/76   Pulse 71   Temp 97.8 F (36.6 C) (Oral)   Ht 5' 9.5 (1.765 m)   Wt (!) 372 lb (168.7 kg)   LMP 11/21/2012   SpO2 99%   BMI 54.15 kg/m  Wt Readings from Last 3 Encounters:  11/23/24 (!) 372 lb (168.7 kg)  07/11/24 (!) 366 lb 6.4 oz (166.2 kg)  03/24/24 (!) 368 lb (166.9 kg)    Physical Exam Vitals reviewed.  Constitutional:      General: She is not in acute distress.    Appearance: Normal appearance.  HENT:     Head: Normocephalic and atraumatic.     Right Ear: External ear normal.     Left Ear: External ear normal.     Mouth/Throat:     Pharynx: No oropharyngeal exudate or posterior oropharyngeal erythema.  Eyes:     General: No scleral icterus.       Right eye: No discharge.        Left eye: No discharge.     Conjunctiva/sclera: Conjunctivae normal.  Neck:     Thyroid : No thyromegaly.  Cardiovascular:     Rate and Rhythm: Normal rate and regular rhythm.  Pulmonary:     Effort: No respiratory distress.     Breath sounds: Normal breath sounds. No wheezing.  Abdominal:     General: Bowel sounds are normal.     Palpations:  Abdomen is soft.     Tenderness: There is no abdominal tenderness.  Musculoskeletal:        General: No swelling or tenderness.     Cervical back: Neck supple. No tenderness.  Lymphadenopathy:     Cervical: No cervical adenopathy.  Skin:    Findings: No  erythema or rash.  Neurological:     Mental Status: She is alert.  Psychiatric:        Mood and Affect: Mood normal.        Behavior: Behavior normal.         Outpatient Encounter Medications as of 11/23/2024  Medication Sig   acetaminophen (TYLENOL) 500 MG tablet Take 500 mg by mouth every 6 (six) hours as needed.   aspirin EC 81 MG tablet Take 81 mg by mouth daily. Swallow whole.   rosuvastatin  (CRESTOR ) 40 MG tablet Take 1 tablet (40 mg total) by mouth daily.   Semaglutide , 2 MG/DOSE, (OZEMPIC , 2 MG/DOSE,) 8 MG/3ML SOPN INJECT 2 MG AS DIRECTED INTO THE SKIN ONCE A WEEK.   metFORMIN  (GLUCOPHAGE -XR) 500 MG 24 hr tablet Take 1 tablet (500 mg total) by mouth daily with breakfast.   potassium chloride  (KLOR-CON  M10) 10 MEQ tablet Take 1 tablet (10 mEq total) by mouth daily.   triamterene -hydrochlorothiazide (MAXZIDE-25) 37.5-25 MG tablet Take 1 tablet by mouth daily.   [DISCONTINUED] metFORMIN  (GLUCOPHAGE -XR) 500 MG 24 hr tablet Take 1 tablet (500 mg total) by mouth daily with breakfast.   [DISCONTINUED] potassium chloride  (K-DUR) 10 MEQ tablet TAKE 1 TABLET EVERY DAY   [DISCONTINUED] potassium chloride  (KLOR-CON  M10) 10 MEQ tablet Take 1 tablet (10 mEq total) by mouth daily.   [DISCONTINUED] triamterene -hydrochlorothiazide (MAXZIDE-25) 37.5-25 MG tablet Take 1 tablet by mouth daily.   No facility-administered encounter medications on file as of 11/23/2024.     Lab Results  Component Value Date   WBC 4.6 11/21/2024   HGB 13.0 11/21/2024   HCT 39.7 11/21/2024   PLT 133.0 (L) 11/21/2024   GLUCOSE 99 11/21/2024   CHOL 182 11/21/2024   TRIG 117.0 11/21/2024   HDL 64.00 11/21/2024   LDLDIRECT 140.7 07/13/2013   LDLCALC 95  11/21/2024   ALT 28 11/21/2024   AST 22 11/21/2024   NA 141 11/21/2024   K 3.9 11/21/2024   CL 105 11/21/2024   CREATININE 0.97 11/21/2024   BUN 15 11/21/2024   CO2 30 11/21/2024   TSH 1.16 11/21/2024   HGBA1C 6.1 11/21/2024   MICROALBUR 0.8 04/27/2024    MM 3D SCREENING MAMMOGRAM BILATERAL BREAST Result Date: 02/11/2024 CLINICAL DATA:  Screening. EXAM: DIGITAL SCREENING BILATERAL MAMMOGRAM WITH TOMOSYNTHESIS AND CAD TECHNIQUE: Bilateral screening digital craniocaudal and mediolateral oblique mammograms were obtained. Bilateral screening digital breast tomosynthesis was performed. The images were evaluated with computer-aided detection. COMPARISON:  Previous exam(s). ACR Breast Density Category b: There are scattered areas of fibroglandular density. FINDINGS: There are no findings suspicious for malignancy. IMPRESSION: No mammographic evidence of malignancy. A result letter of this screening mammogram will be mailed directly to the patient. RECOMMENDATION: Screening mammogram in one year. (Code:SM-B-01Y) BI-RADS CATEGORY  1: Negative. Electronically Signed   By: Dina  Arceo M.D.   On: 02/11/2024 12:37       Assessment & Plan:  Witnessed apneic spells Assessment & Plan: Discussed concern regarding possible sleep apnea. Witnessed apneic episodes and increased snoring. Refer to pulmonary for further evaluation.   Orders: -     Pulmonary Visit  Essential hypertension, benign Assessment & Plan: Blood pressure doing well.  Continues on triam/hctz.  Follow pressures.  Follow metabolic panel.  No change in medication today.   Orders: -     Potassium Chloride  Crys ER; Take 1 tablet (10 mEq total) by mouth daily.  Dispense: 90 tablet; Refill: 1  Thrombocytopenia Assessment & Plan: Follow cbc. Slightly  decreased platelet count.    Hypercholesteremia Assessment & Plan: crestor  20mg  q day. Low cholesterol diet and exercise.  Check lipid panel.  Lab Results  Component Value Date   CHOL  182 11/21/2024   HDL 64.00 11/21/2024   LDLCALC 95 11/21/2024   LDLDIRECT 140.7 07/13/2013   TRIG 117.0 11/21/2024   CHOLHDL 3 11/21/2024    Orders: -     Hepatic function panel; Future -     Lipid panel; Future  Type 2 diabetes mellitus with hyperglycemia, without long-term current use of insulin (HCC) Assessment & Plan: On ozempic .   Low carb diet and exercise.  Follow met b and A1c.  Lab Results  Component Value Date   HGBA1C 6.1 11/21/2024     Orders: -     Basic metabolic panel with GFR; Future -     Hemoglobin A1c; Future  Severe obesity (BMI >= 40) (HCC) Assessment & Plan: Is exercising. Conitnues on ozempic . Continue diet and exercise.    History of colonic polyps Assessment & Plan: Colonoscopy 04/15/23 - one rectal polyp - hyperplastic and one polyp descending colon - tubular adenoma.    Other orders -     metFORMIN  HCl ER; Take 1 tablet (500 mg total) by mouth daily with breakfast.  Dispense: 90 tablet; Refill: 1 -     Triamterene -HCTZ; Take 1 tablet by mouth daily.  Dispense: 90 tablet; Refill: 1     Allena Hamilton, MD "

## 2024-11-26 ENCOUNTER — Encounter: Payer: Self-pay | Admitting: Internal Medicine

## 2024-11-26 NOTE — Assessment & Plan Note (Signed)
 Colonoscopy 04/15/23 - one rectal polyp - hyperplastic and one polyp descending colon - tubular adenoma.

## 2024-11-26 NOTE — Assessment & Plan Note (Signed)
 Follow cbc. Slightly decreased platelet count.

## 2024-11-26 NOTE — Assessment & Plan Note (Signed)
 On ozempic .   Low carb diet and exercise.  Follow met b and A1c.  Lab Results  Component Value Date   HGBA1C 6.1 11/21/2024

## 2024-11-26 NOTE — Assessment & Plan Note (Signed)
 Discussed concern regarding possible sleep apnea. Witnessed apneic episodes and increased snoring. Refer to pulmonary for further evaluation.

## 2024-11-26 NOTE — Assessment & Plan Note (Signed)
 Blood pressure doing well.  Continues on triam/hctz.  Follow pressures.  Follow metabolic panel.  No change in medication today.

## 2024-11-26 NOTE — Assessment & Plan Note (Signed)
 crestor  20mg  q day. Low cholesterol diet and exercise.  Check lipid panel.  Lab Results  Component Value Date   CHOL 182 11/21/2024   HDL 64.00 11/21/2024   LDLCALC 95 11/21/2024   LDLDIRECT 140.7 07/13/2013   TRIG 117.0 11/21/2024   CHOLHDL 3 11/21/2024

## 2024-11-26 NOTE — Assessment & Plan Note (Signed)
 Is exercising. Conitnues on ozempic . Continue diet and exercise.

## 2025-03-30 ENCOUNTER — Other Ambulatory Visit

## 2025-04-06 ENCOUNTER — Ambulatory Visit: Admitting: Internal Medicine
